# Patient Record
Sex: Female | Born: 1970 | Race: White | Hispanic: No | Smoking: Current every day smoker
Health system: Southern US, Community
[De-identification: ages and names within clinical notes are randomized; demographics above are authoritative.]

## PROBLEM LIST (undated history)

## (undated) DIAGNOSIS — F509 Eating disorder, unspecified: Secondary | ICD-10-CM

## (undated) DIAGNOSIS — N159 Renal tubulo-interstitial disease, unspecified: Secondary | ICD-10-CM

## (undated) DIAGNOSIS — F329 Major depressive disorder, single episode, unspecified: Secondary | ICD-10-CM

## (undated) DIAGNOSIS — E079 Disorder of thyroid, unspecified: Secondary | ICD-10-CM

## (undated) DIAGNOSIS — F419 Anxiety disorder, unspecified: Secondary | ICD-10-CM

## (undated) DIAGNOSIS — K3580 Unspecified acute appendicitis: Secondary | ICD-10-CM

## (undated) DIAGNOSIS — R001 Bradycardia, unspecified: Secondary | ICD-10-CM

## (undated) DIAGNOSIS — F32A Depression, unspecified: Secondary | ICD-10-CM

## (undated) DIAGNOSIS — F319 Bipolar disorder, unspecified: Secondary | ICD-10-CM

## (undated) DIAGNOSIS — K649 Unspecified hemorrhoids: Secondary | ICD-10-CM

## (undated) DIAGNOSIS — F209 Schizophrenia, unspecified: Secondary | ICD-10-CM

## (undated) DIAGNOSIS — F1010 Alcohol abuse, uncomplicated: Secondary | ICD-10-CM

## (undated) DIAGNOSIS — N200 Calculus of kidney: Secondary | ICD-10-CM

## (undated) HISTORY — PX: APPENDECTOMY: SHX54

## (undated) HISTORY — DX: Alcohol abuse, uncomplicated: F10.10

## (undated) HISTORY — DX: Bipolar disorder, unspecified: F31.9

## (undated) HISTORY — DX: Unspecified hemorrhoids: K64.9

## (undated) HISTORY — DX: Disorder of thyroid, unspecified: E07.9

## (undated) HISTORY — DX: Schizophrenia, unspecified: F20.9

## (undated) HISTORY — PX: OTHER SURGICAL HISTORY: SHX169

## (undated) HISTORY — PX: TUBAL LIGATION: SHX77

## (undated) HISTORY — DX: Calculus of kidney: N20.0

## (undated) HISTORY — DX: Eating disorder, unspecified: F50.9

---

## 2001-08-31 ENCOUNTER — Emergency Department (HOSPITAL_COMMUNITY): Admission: EM | Admit: 2001-08-31 | Discharge: 2001-08-31 | Payer: Self-pay | Admitting: Emergency Medicine

## 2002-03-11 ENCOUNTER — Encounter: Payer: Self-pay | Admitting: Internal Medicine

## 2002-03-11 ENCOUNTER — Ambulatory Visit (HOSPITAL_COMMUNITY): Admission: RE | Admit: 2002-03-11 | Discharge: 2002-03-11 | Payer: Self-pay | Admitting: Internal Medicine

## 2003-12-30 ENCOUNTER — Emergency Department (HOSPITAL_COMMUNITY): Admission: EM | Admit: 2003-12-30 | Discharge: 2003-12-30 | Payer: Self-pay | Admitting: Emergency Medicine

## 2004-08-12 ENCOUNTER — Ambulatory Visit: Payer: Self-pay | Admitting: Family Medicine

## 2004-08-19 ENCOUNTER — Ambulatory Visit (HOSPITAL_COMMUNITY): Admission: RE | Admit: 2004-08-19 | Discharge: 2004-08-19 | Payer: Self-pay | Admitting: Internal Medicine

## 2004-08-30 ENCOUNTER — Other Ambulatory Visit: Admission: RE | Admit: 2004-08-30 | Discharge: 2004-08-30 | Payer: Self-pay | Admitting: Family Medicine

## 2004-08-30 ENCOUNTER — Ambulatory Visit: Payer: Self-pay | Admitting: Family Medicine

## 2005-02-27 ENCOUNTER — Ambulatory Visit: Payer: Self-pay | Admitting: Family Medicine

## 2005-03-16 ENCOUNTER — Encounter: Admission: RE | Admit: 2005-03-16 | Discharge: 2005-03-16 | Payer: Self-pay | Admitting: Internal Medicine

## 2005-03-17 ENCOUNTER — Ambulatory Visit: Payer: Self-pay | Admitting: Family Medicine

## 2006-07-26 ENCOUNTER — Ambulatory Visit: Payer: Self-pay | Admitting: Family Medicine

## 2006-08-16 ENCOUNTER — Ambulatory Visit: Payer: Self-pay | Admitting: Internal Medicine

## 2006-08-16 ENCOUNTER — Encounter (INDEPENDENT_AMBULATORY_CARE_PROVIDER_SITE_OTHER): Payer: Self-pay | Admitting: Family Medicine

## 2006-08-16 LAB — CONVERTED CEMR LAB: Pap Smear: NORMAL

## 2006-09-06 ENCOUNTER — Encounter (INDEPENDENT_AMBULATORY_CARE_PROVIDER_SITE_OTHER): Payer: Self-pay | Admitting: Family Medicine

## 2006-09-21 ENCOUNTER — Emergency Department (HOSPITAL_COMMUNITY): Admission: EM | Admit: 2006-09-21 | Discharge: 2006-09-21 | Payer: Self-pay | Admitting: Emergency Medicine

## 2006-12-03 ENCOUNTER — Ambulatory Visit: Payer: Self-pay | Admitting: Internal Medicine

## 2007-08-05 ENCOUNTER — Ambulatory Visit: Payer: Self-pay | Admitting: Internal Medicine

## 2007-09-16 ENCOUNTER — Emergency Department (HOSPITAL_COMMUNITY): Admission: EM | Admit: 2007-09-16 | Discharge: 2007-09-16 | Payer: Self-pay | Admitting: Emergency Medicine

## 2007-09-30 ENCOUNTER — Ambulatory Visit: Payer: Self-pay | Admitting: Internal Medicine

## 2007-09-30 ENCOUNTER — Encounter: Payer: Self-pay | Admitting: Family Medicine

## 2007-09-30 LAB — CONVERTED CEMR LAB
Chlamydia, DNA Probe: NEGATIVE
GC Probe Amp, Genital: NEGATIVE

## 2007-10-08 ENCOUNTER — Ambulatory Visit: Payer: Self-pay | Admitting: Internal Medicine

## 2007-10-08 ENCOUNTER — Encounter: Payer: Self-pay | Admitting: Family Medicine

## 2007-10-08 LAB — CONVERTED CEMR LAB
ALT: 23 units/L (ref 0–35)
AST: 28 units/L (ref 0–37)
Albumin: 4.8 g/dL (ref 3.5–5.2)
Alkaline Phosphatase: 42 units/L (ref 39–117)
BUN: 9 mg/dL (ref 6–23)
Basophils Absolute: 0 10*3/uL (ref 0.0–0.1)
Basophils Relative: 1 % (ref 0–1)
CO2: 25 meq/L (ref 19–32)
Calcium: 9.8 mg/dL (ref 8.4–10.5)
Chloride: 103 meq/L (ref 96–112)
Cholesterol: 191 mg/dL (ref 0–200)
Creatinine, Ser: 0.73 mg/dL (ref 0.40–1.20)
Eosinophils Absolute: 0.1 10*3/uL (ref 0.0–0.7)
Eosinophils Relative: 2 % (ref 0–5)
Glucose, Bld: 111 mg/dL — ABNORMAL HIGH (ref 70–99)
HCT: 41.9 % (ref 36.0–46.0)
HDL: 108 mg/dL (ref >39)
Hemoglobin: 13.5 g/dL (ref 12.0–15.0)
LDL Cholesterol: 70 mg/dL (ref 0–99)
Lymphocytes Relative: 35 % (ref 12–46)
Lymphs Abs: 1.3 10*3/uL (ref 0.7–4.0)
MCHC: 32.2 g/dL (ref 30.0–36.0)
MCV: 100.7 fL — ABNORMAL HIGH (ref 78.0–100.0)
Monocytes Absolute: 0.4 10*3/uL (ref 0.1–1.0)
Monocytes Relative: 11 % (ref 3–12)
Neutro Abs: 2 10*3/uL (ref 1.7–7.7)
Neutrophils Relative %: 51 % (ref 43–77)
Platelets: 231 10*3/uL (ref 150–400)
Potassium: 4 meq/L (ref 3.5–5.3)
RBC: 4.16 M/uL (ref 3.87–5.11)
RDW: 12.5 % (ref 11.5–15.5)
Sodium: 139 meq/L (ref 135–145)
TSH: 1.543 microintl units/mL (ref 0.350–4.50)
Total Bilirubin: 0.7 mg/dL (ref 0.3–1.2)
Total CHOL/HDL Ratio: 1.8
Total Protein: 7.6 g/dL (ref 6.0–8.3)
Triglycerides: 64 mg/dL (ref <150)
VLDL: 13 mg/dL (ref 0–40)
WBC: 3.8 10*3/uL — ABNORMAL LOW (ref 4.0–10.5)

## 2007-10-31 ENCOUNTER — Emergency Department (HOSPITAL_COMMUNITY): Admission: EM | Admit: 2007-10-31 | Discharge: 2007-10-31 | Payer: Self-pay | Admitting: Emergency Medicine

## 2007-12-20 ENCOUNTER — Ambulatory Visit: Payer: Self-pay | Admitting: Internal Medicine

## 2008-02-12 ENCOUNTER — Ambulatory Visit: Payer: Self-pay | Admitting: Family Medicine

## 2008-03-24 ENCOUNTER — Ambulatory Visit: Payer: Self-pay | Admitting: Internal Medicine

## 2008-06-17 ENCOUNTER — Ambulatory Visit: Payer: Self-pay | Admitting: Family Medicine

## 2008-09-23 ENCOUNTER — Other Ambulatory Visit: Admission: RE | Admit: 2008-09-23 | Discharge: 2008-09-23 | Payer: Self-pay | Admitting: Family Medicine

## 2008-09-23 ENCOUNTER — Ambulatory Visit: Payer: Self-pay | Admitting: Internal Medicine

## 2008-09-23 ENCOUNTER — Encounter: Payer: Self-pay | Admitting: Family Medicine

## 2008-09-23 LAB — CONVERTED CEMR LAB
ALT: 18 units/L (ref 0–35)
AST: 20 units/L (ref 0–37)
Albumin: 3.8 g/dL (ref 3.5–5.2)
Alkaline Phosphatase: 49 units/L (ref 39–117)
Amphetamine Screen, Ur: NEGATIVE
BUN: 10 mg/dL (ref 6–23)
Barbiturate Quant, Ur: NEGATIVE
Basophils Absolute: 0.1 10*3/uL (ref 0.0–0.1)
Basophils Relative: 1 % (ref 0–1)
Benzodiazepines.: NEGATIVE
CO2: 28 meq/L (ref 19–32)
Calcium: 9.3 mg/dL (ref 8.4–10.5)
Chlamydia, DNA Probe: NEGATIVE
Chloride: 104 meq/L (ref 96–112)
Cocaine Metabolites: NEGATIVE
Creatinine, Ser: 0.76 mg/dL (ref 0.40–1.20)
Creatinine,U: 27.6 mg/dL
Eosinophils Absolute: 0.2 10*3/uL (ref 0.0–0.7)
Eosinophils Relative: 3 % (ref 0–5)
GC Probe Amp, Genital: NEGATIVE
Glucose, Bld: 82 mg/dL (ref 70–99)
HCT: 42.5 % (ref 36.0–46.0)
Hemoglobin: 13.8 g/dL (ref 12.0–15.0)
Lymphocytes Relative: 36 % (ref 12–46)
Lymphs Abs: 2.3 10*3/uL (ref 0.7–4.0)
MCHC: 32.5 g/dL (ref 30.0–36.0)
MCV: 95.5 fL (ref 78.0–100.0)
Marijuana Metabolite: NEGATIVE
Methadone: NEGATIVE
Monocytes Absolute: 0.4 10*3/uL (ref 0.1–1.0)
Monocytes Relative: 7 % (ref 3–12)
Neutro Abs: 3.4 10*3/uL (ref 1.7–7.7)
Neutrophils Relative %: 53 % (ref 43–77)
Opiate Screen, Urine: NEGATIVE
Phencyclidine (PCP): NEGATIVE
Platelets: 231 10*3/uL (ref 150–400)
Potassium: 4.8 meq/L (ref 3.5–5.3)
Propoxyphene: NEGATIVE
RBC: 4.45 M/uL (ref 3.87–5.11)
RDW: 14 % (ref 11.5–15.5)
Sed Rate: 2 mm/hr (ref 0–22)
Sodium: 140 meq/L (ref 135–145)
Total Bilirubin: 0.5 mg/dL (ref 0.3–1.2)
Total Protein: 5.9 g/dL — ABNORMAL LOW (ref 6.0–8.3)
WBC: 6.3 10*3/uL (ref 4.0–10.5)

## 2008-09-29 ENCOUNTER — Ambulatory Visit: Payer: Self-pay | Admitting: Internal Medicine

## 2008-12-04 ENCOUNTER — Ambulatory Visit: Payer: Self-pay | Admitting: Family Medicine

## 2008-12-04 ENCOUNTER — Encounter (INDEPENDENT_AMBULATORY_CARE_PROVIDER_SITE_OTHER): Payer: Self-pay | Admitting: Internal Medicine

## 2008-12-04 LAB — CONVERTED CEMR LAB
Basophils Absolute: 0 10*3/uL (ref 0.0–0.1)
Basophils Relative: 1 % (ref 0–1)
Eosinophils Absolute: 0.1 10*3/uL (ref 0.0–0.7)
Eosinophils Relative: 2 % (ref 0–5)
FSH: 6.7 milliintl units/mL
Ferritin: 11 ng/mL (ref 10–291)
HCT: 39.3 % (ref 36.0–46.0)
Hemoglobin: 13 g/dL (ref 12.0–15.0)
Iron: 62 ug/dL (ref 42–145)
LH: 5.1 milliintl units/mL
Lipase: 16 units/L (ref 0–75)
Lymphocytes Relative: 36 % (ref 12–46)
Lymphs Abs: 1.7 10*3/uL (ref 0.7–4.0)
MCHC: 33.1 g/dL (ref 30.0–36.0)
MCV: 95.9 fL (ref 78.0–100.0)
Monocytes Absolute: 0.3 10*3/uL (ref 0.1–1.0)
Monocytes Relative: 6 % (ref 3–12)
Neutro Abs: 2.6 10*3/uL (ref 1.7–7.7)
Neutrophils Relative %: 55 % (ref 43–77)
Platelets: 235 10*3/uL (ref 150–400)
RBC: 4.1 M/uL (ref 3.87–5.11)
RDW: 12.6 % (ref 11.5–15.5)
Saturation Ratios: 19 % — ABNORMAL LOW (ref 20–55)
TIBC: 328 ug/dL (ref 250–470)
TSH: 1.676 microintl units/mL (ref 0.350–4.500)
UIBC: 266 ug/dL
WBC: 4.8 10*3/uL (ref 4.0–10.5)

## 2008-12-09 ENCOUNTER — Ambulatory Visit (HOSPITAL_COMMUNITY): Admission: RE | Admit: 2008-12-09 | Discharge: 2008-12-09 | Payer: Self-pay | Admitting: Internal Medicine

## 2009-01-13 ENCOUNTER — Ambulatory Visit: Payer: Self-pay | Admitting: Internal Medicine

## 2009-03-23 ENCOUNTER — Ambulatory Visit: Payer: Self-pay | Admitting: Family Medicine

## 2009-06-03 ENCOUNTER — Ambulatory Visit: Payer: Self-pay | Admitting: Family Medicine

## 2009-07-26 ENCOUNTER — Ambulatory Visit: Payer: Self-pay | Admitting: Internal Medicine

## 2009-12-28 ENCOUNTER — Other Ambulatory Visit: Admission: RE | Admit: 2009-12-28 | Discharge: 2009-12-28 | Payer: Self-pay | Admitting: Internal Medicine

## 2009-12-28 ENCOUNTER — Encounter (INDEPENDENT_AMBULATORY_CARE_PROVIDER_SITE_OTHER): Payer: Self-pay | Admitting: Internal Medicine

## 2009-12-28 LAB — CONVERTED CEMR LAB
Chlamydia, DNA Probe: NEGATIVE
GC Probe Amp, Genital: NEGATIVE
HCV Ab: NEGATIVE
Hep A IgM: NEGATIVE
Hep B C IgM: NEGATIVE
Hepatitis B Surface Ag: NEGATIVE

## 2010-01-03 ENCOUNTER — Ambulatory Visit (HOSPITAL_COMMUNITY): Admission: RE | Admit: 2010-01-03 | Discharge: 2010-01-03 | Payer: Self-pay | Admitting: Internal Medicine

## 2010-03-16 ENCOUNTER — Ambulatory Visit: Payer: Self-pay | Admitting: Obstetrics and Gynecology

## 2010-05-30 LAB — POCT PREGNANCY, URINE: Preg Test, Ur: NEGATIVE

## 2010-06-02 ENCOUNTER — Ambulatory Visit: Payer: Self-pay

## 2010-12-15 LAB — URINALYSIS, ROUTINE W REFLEX MICROSCOPIC
Bilirubin Urine: NEGATIVE
Glucose, UA: NEGATIVE
Ketones, ur: NEGATIVE
Nitrite: NEGATIVE
Protein, ur: 100 — AB
Specific Gravity, Urine: 1.015
Urobilinogen, UA: 1
pH: 7.5

## 2010-12-15 LAB — PREGNANCY, URINE: Preg Test, Ur: NEGATIVE

## 2010-12-15 LAB — URINE CULTURE: Colony Count: >=100000

## 2010-12-15 LAB — URINE MICROSCOPIC-ADD ON

## 2011-02-05 ENCOUNTER — Encounter: Payer: Self-pay | Admitting: *Deleted

## 2011-02-05 ENCOUNTER — Emergency Department (HOSPITAL_COMMUNITY)
Admission: EM | Admit: 2011-02-05 | Discharge: 2011-02-05 | Disposition: A | Discharge status: Home / Self Care | Payer: Medicare Other | Attending: Emergency Medicine | Admitting: Emergency Medicine

## 2011-02-05 DIAGNOSIS — R109 Unspecified abdominal pain: Secondary | ICD-10-CM | POA: Insufficient documentation

## 2011-02-05 DIAGNOSIS — N12 Tubulo-interstitial nephritis, not specified as acute or chronic: Secondary | ICD-10-CM | POA: Insufficient documentation

## 2011-02-05 DIAGNOSIS — R300 Dysuria: Secondary | ICD-10-CM | POA: Insufficient documentation

## 2011-02-05 DIAGNOSIS — R6883 Chills (without fever): Secondary | ICD-10-CM | POA: Insufficient documentation

## 2011-02-05 HISTORY — DX: Renal tubulo-interstitial disease, unspecified: N15.9

## 2011-02-05 LAB — URINE MICROSCOPIC-ADD ON

## 2011-02-05 LAB — URINALYSIS, ROUTINE W REFLEX MICROSCOPIC
Bilirubin Urine: NEGATIVE
Glucose, UA: NEGATIVE mg/dL
Hgb urine dipstick: NEGATIVE
Ketones, ur: NEGATIVE mg/dL
Nitrite: NEGATIVE
Protein, ur: NEGATIVE mg/dL
Specific Gravity, Urine: 1.016 (ref 1.005–1.030)
Urobilinogen, UA: 0.2 mg/dL (ref 0.0–1.0)
pH: 7.5 (ref 5.0–8.0)

## 2011-02-05 LAB — POCT PREGNANCY, URINE: Preg Test, Ur: NEGATIVE

## 2011-02-05 MED ORDER — CIPROFLOXACIN HCL 500 MG PO TABS
ORAL_TABLET | ORAL | Status: AC
  Filled 2011-02-05: qty 1

## 2011-02-05 MED ORDER — CIPROFLOXACIN HCL 500 MG PO TABS: 500.0000 mg | ORAL_TABLET | Freq: Two times a day (BID) | ORAL | Status: AC

## 2011-02-05 MED ORDER — ADENOSINE 6 MG/2ML IV SOLN
INTRAVENOUS | Status: AC
  Filled 2011-02-05: qty 6

## 2011-02-05 MED ORDER — ONDANSETRON 4 MG PO TBDP
8.0000 mg | ORAL_TABLET | Freq: Once | ORAL | Status: AC
  Administered 2011-02-05: 8 mg via ORAL
  Filled 2011-02-05: qty 2

## 2011-02-05 MED ORDER — CIPROFLOXACIN HCL 500 MG PO TABS
500.0000 mg | ORAL_TABLET | Freq: Once | ORAL | Status: AC
  Administered 2011-02-05: 500 mg via ORAL

## 2011-02-05 MED ORDER — OXYCODONE-ACETAMINOPHEN 5-325 MG PO TABS
2.0000 | ORAL_TABLET | Freq: Once | ORAL | Status: AC
  Administered 2011-02-05: 2 via ORAL
  Filled 2011-02-05: qty 2

## 2011-02-05 NOTE — ED Notes (Signed)
Reports fever, pain with urination, right flank pain.

## 2011-02-05 NOTE — ED Provider Notes (Signed)
History     CSN: [id number] Arrival date & time: 02/05/2011 10:28 AM   First MD Initiated Contact with Patient 02/05/11 1041    pt seen at 1135am  Chief Complaint  Patient presents with  . Flank Pain    Patient is a 40 y.o. female presenting with flank pain. The history is provided by the patient.  Flank Pain This is a new problem. The current episode started yesterday. The problem occurs constantly. The problem has been gradually worsening. Pertinent negatives include no chest pain, no abdominal pain, no headaches and no shortness of breath. Exacerbated by: urination. The symptoms are relieved by nothing. Treatments tried: pain meds. The treatment provided mild relief.  pt with right flank pain, dysuria and chills for past day Also reports nausea Denies vag bleeding/discharge She does report h/o frequent UTI in past  Past Medical History  Diagnosis Date  . Kidney infection     Past Surgical History  Procedure Date  . Tubal ligation     History reviewed. No pertinent family history.  History  Substance Use Topics  . Smoking status: Never Smoker   . Smokeless tobacco: Not on file  . Alcohol Use: No    OB History    Grav Para Term Preterm Abortions TAB SAB Ect Mult Living                  Review of Systems  Respiratory: Negative for shortness of breath.   Cardiovascular: Negative for chest pain.  Gastrointestinal: Negative for abdominal pain.  Genitourinary: Positive for flank pain.  Neurological: Negative for headaches.  All other systems reviewed and are negative.    Allergies  Penicillins  Home Medications   Current Outpatient Rx  Name Route Sig Dispense Refill  . MENSTRUAL COMPLETE MAX STR PO Oral Take 2 capsules by mouth every 6 (six) hours.        BP 113/69  Pulse 62  Temp(Src) 98 F (36.7 C) (Oral)  Resp 18  SpO2 100%  Physical Exam  CONSTITUTIONAL: Well developed/well nourished HEAD AND FACE: Normocephalic/atraumatic EYES:  EOMI/PERRL ENMT: Mucous membranes moist NECK: supple no meningeal signs SPINE:entire spine nontender CV: S1/S2 noted, no murmurs/rubs/gallops noted LUNGS: Lungs are clear to auscultation bilaterally, no apparent distress ABDOMEN: soft, nontender, no rebound or guarding GE:XBMWU cva tenderness NEURO: Pt is awake/alert, moves all extremitiesx4 EXTREMITIES: pulses normal, full ROM SKIN: warm, color normal PSYCH: no abnormalities of mood noted   ED Course  Procedures    Labs Reviewed  POCT PREGNANCY, URINE  URINALYSIS, ROUTINE W REFLEX MICROSCOPIC      11:59 AM Will tx pain and reassess, stable at this time   2:01 PM Pt improved BP 110/77  Pulse 71  Temp(Src) 98.4 F (36.9 C) (Oral)  Resp 16  SpO2 100% Taking PO abd soft No CP/SOB reported Denies h/o urologic surgery Start cipro due to pcn allergy Discussed strict return precautions  MDM  Nursing notes reviewed and considered in documentation All labs/vitals reviewed and considered         Joya Gaskins, MD 02/05/11 1401

## 2011-07-07 ENCOUNTER — Encounter (HOSPITAL_COMMUNITY): Payer: Self-pay | Admitting: Emergency Medicine

## 2011-07-07 ENCOUNTER — Emergency Department (HOSPITAL_COMMUNITY)
Admission: EM | Admit: 2011-07-07 | Discharge: 2011-07-08 | Disposition: A | Discharge status: Home / Self Care | Payer: Medicare Other | Attending: Emergency Medicine | Admitting: Emergency Medicine

## 2011-07-07 ENCOUNTER — Emergency Department (HOSPITAL_COMMUNITY): Payer: Medicare Other

## 2011-07-07 DIAGNOSIS — D259 Leiomyoma of uterus, unspecified: Secondary | ICD-10-CM | POA: Insufficient documentation

## 2011-07-07 DIAGNOSIS — R300 Dysuria: Secondary | ICD-10-CM | POA: Insufficient documentation

## 2011-07-07 DIAGNOSIS — R509 Fever, unspecified: Secondary | ICD-10-CM | POA: Insufficient documentation

## 2011-07-07 DIAGNOSIS — R10813 Right lower quadrant abdominal tenderness: Secondary | ICD-10-CM | POA: Insufficient documentation

## 2011-07-07 DIAGNOSIS — N831 Corpus luteum cyst of ovary, unspecified side: Secondary | ICD-10-CM | POA: Insufficient documentation

## 2011-07-07 LAB — DIFFERENTIAL
Basophils Absolute: 0 10*3/uL (ref 0.0–0.1)
Basophils Relative: 0 % (ref 0–1)
Eosinophils Absolute: 0 10*3/uL (ref 0.0–0.7)
Eosinophils Relative: 0 % (ref 0–5)
Lymphocytes Relative: 8 % — ABNORMAL LOW (ref 12–46)
Lymphs Abs: 0.8 10*3/uL (ref 0.7–4.0)
Monocytes Absolute: 0.2 10*3/uL (ref 0.1–1.0)
Monocytes Relative: 2 % — ABNORMAL LOW (ref 3–12)
Neutro Abs: 8.7 10*3/uL — ABNORMAL HIGH (ref 1.7–7.7)
Neutrophils Relative %: 89 % — ABNORMAL HIGH (ref 43–77)

## 2011-07-07 LAB — CBC
HCT: 34 % — ABNORMAL LOW (ref 36.0–46.0)
Hemoglobin: 11.6 g/dL — ABNORMAL LOW (ref 12.0–15.0)
MCH: 30.9 pg (ref 26.0–34.0)
MCHC: 34.1 g/dL (ref 30.0–36.0)
MCV: 90.7 fL (ref 78.0–100.0)
Platelets: 240 10*3/uL (ref 150–400)
RBC: 3.75 MIL/uL — ABNORMAL LOW (ref 3.87–5.11)
RDW: 13.4 % (ref 11.5–15.5)
WBC: 9.7 10*3/uL (ref 4.0–10.5)

## 2011-07-07 LAB — URINALYSIS, ROUTINE W REFLEX MICROSCOPIC
Bilirubin Urine: NEGATIVE
Glucose, UA: NEGATIVE mg/dL
Hgb urine dipstick: NEGATIVE
Ketones, ur: NEGATIVE mg/dL
Leukocytes, UA: NEGATIVE
Nitrite: NEGATIVE
Protein, ur: NEGATIVE mg/dL
Specific Gravity, Urine: 1.013 (ref 1.005–1.030)
Urobilinogen, UA: 0.2 mg/dL (ref 0.0–1.0)
pH: 8 (ref 5.0–8.0)

## 2011-07-07 LAB — WET PREP, GENITAL
Clue Cells Wet Prep HPF POC: NONE SEEN
Trich, Wet Prep: NONE SEEN
Yeast Wet Prep HPF POC: NONE SEEN

## 2011-07-07 LAB — BASIC METABOLIC PANEL
BUN: 12 mg/dL (ref 6–23)
CO2: 22 mEq/L (ref 19–32)
Calcium: 8.2 mg/dL — ABNORMAL LOW (ref 8.4–10.5)
Chloride: 104 mEq/L (ref 96–112)
Creatinine, Ser: 0.79 mg/dL (ref 0.50–1.10)
GFR calc Af Amer: >90 mL/min (ref >90)
GFR calc non Af Amer: >90 mL/min (ref >90)
Glucose, Bld: 102 mg/dL — ABNORMAL HIGH (ref 70–99)
Potassium: 3.7 mEq/L (ref 3.5–5.1)
Sodium: 135 mEq/L (ref 135–145)

## 2011-07-07 LAB — POCT PREGNANCY, URINE: Preg Test, Ur: NEGATIVE

## 2011-07-07 MED ORDER — ACETAMINOPHEN 325 MG PO TABS
ORAL_TABLET | ORAL | Status: AC
  Administered 2011-07-07: 975 mg via ORAL
  Filled 2011-07-07: qty 3

## 2011-07-07 MED ORDER — IOHEXOL 300 MG/ML  SOLN
80.0000 mL | Freq: Once | INTRAMUSCULAR | Status: AC | PRN
  Administered 2011-07-07: 80 mL via INTRAVENOUS

## 2011-07-07 MED ORDER — ACETAMINOPHEN 500 MG PO TABS
1000.0000 mg | ORAL_TABLET | Freq: Once | ORAL | Status: AC
  Administered 2011-07-07: 975 mg via ORAL
  Filled 2011-07-07: qty 2

## 2011-07-07 MED ORDER — IOHEXOL 300 MG/ML  SOLN
20.0000 mL | INTRAMUSCULAR | Status: AC
  Administered 2011-07-07: 20 mL via ORAL

## 2011-07-07 NOTE — ED Notes (Signed)
PT drinking contrast. Repositioned. Resting comfortably

## 2011-07-07 NOTE — ED Notes (Signed)
Patient ambulated to restroom.

## 2011-07-07 NOTE — ED Provider Notes (Signed)
History     CSN: [id number]  Arrival date & time 07/07/11  4098   First MD Initiated Contact with Patient 07/07/11 1910      Chief Complaint  Patient presents with  . Fever    (Consider location/radiation/quality/duration/timing/severity/associated sxs/prior treatment) HPI  41 year old female presents with a chief complaint of fever. Patient states since last night she has been experiencing both fever, chills, and a "balloon sensation" to the right flank. She denies any significant abdominal pain but noticed mild burning urination without hematuria or vaginal discharge. Symptom has been persistent, onset was gradual. She took some ibuprofen with some relief. She denies headache, chest pain, shortness of breath, cough, nausea, vomiting, diarrhea, rash, weakness or numbness. She states her last menstrual period was about a month ago. Denies any significant medical history, but states she has history of kidney reflux.  Past Medical History  Diagnosis Date  . Kidney infection     Past Surgical History  Procedure Date  . Tubal ligation     No family history on file.  History  Substance Use Topics  . Smoking status: Never Smoker   . Smokeless tobacco: Not on file  . Alcohol Use: No    OB History    Grav Para Term Preterm Abortions TAB SAB Ect Mult Living                  Review of Systems  All other systems reviewed and are negative.    Allergies  Penicillins  Home Medications   Current Outpatient Rx  Name Route Sig Dispense Refill  . IBUPROFEN 200 MG PO TABS Oral Take 600 mg by mouth every 6 (six) hours as needed. For pain      BP 101/63  Pulse 81  Temp(Src) 102 F (38.9 C) (Oral)  Resp 16  SpO2 100%  LMP 06/07/2011  Physical Exam  Nursing note and vitals reviewed. Constitutional: She is oriented to person, place, and time. She appears well-developed and well-nourished. No distress.       Awake, alert, nontoxic appearance  HENT:  Head: Atraumatic.    Eyes: Conjunctivae are normal. Right eye exhibits no discharge. Left eye exhibits no discharge.  Neck: Neck supple.  Cardiovascular: Normal rate and regular rhythm.   Pulmonary/Chest: Effort normal. No respiratory distress. She exhibits no tenderness.  Abdominal: Soft. There is tenderness in the right lower quadrant. There is no rigidity, no rebound, no guarding, no tenderness at McBurney's point and negative Murphy's sign. No hernia. Hernia confirmed negative in the ventral area.  Genitourinary:       Defer to Dr. Charlsie Merles note as pt request female to perform pelvic exam  Musculoskeletal: She exhibits no tenderness.       ROM appears intact, no obvious focal weakness  Neurological: She is alert and oriented to person, place, and time.       Mental status and motor strength appears intact  Skin: No rash noted.  Psychiatric: She has a normal mood and affect.    ED Course  Procedures (including critical care time)   Labs Reviewed  URINALYSIS, ROUTINE W REFLEX MICROSCOPIC  POCT PREGNANCY, URINE   No results found.   No diagnosis found.  Results for orders placed during the hospital encounter of 07/07/11  URINALYSIS, ROUTINE W REFLEX MICROSCOPIC      Component Value Range   Color, Urine YELLOW  YELLOW    APPearance CLEAR  CLEAR    Specific Gravity, Urine 1.013  1.005 - 1.030  pH 8.0  5.0 - 8.0    Glucose, UA NEGATIVE  NEGATIVE (mg/dL)   Hgb urine dipstick NEGATIVE  NEGATIVE    Bilirubin Urine NEGATIVE  NEGATIVE    Ketones, ur NEGATIVE  NEGATIVE (mg/dL)   Protein, ur NEGATIVE  NEGATIVE (mg/dL)   Urobilinogen, UA 0.2  0.0 - 1.0 (mg/dL)   Nitrite NEGATIVE  NEGATIVE    Leukocytes, UA NEGATIVE  NEGATIVE   POCT PREGNANCY, URINE      Component Value Range   Preg Test, Ur NEGATIVE  NEGATIVE   CBC      Component Value Range   WBC 9.7  4.0 - 10.5 (K/uL)   RBC 3.75 (*) 3.87 - 5.11 (MIL/uL)   Hemoglobin 11.6 (*) 12.0 - 15.0 (g/dL)   HCT 40.9 (*) 81.1 - 46.0 (%)   MCV 90.7   78.0 - 100.0 (fL)   MCH 30.9  26.0 - 34.0 (pg)   MCHC 34.1  30.0 - 36.0 (g/dL)   RDW 91.4  78.2 - 95.6 (%)   Platelets 240  150 - 400 (K/uL)  DIFFERENTIAL      Component Value Range   Neutrophils Relative 89 (*) 43 - 77 (%)   Neutro Abs 8.7 (*) 1.7 - 7.7 (K/uL)   Lymphocytes Relative 8 (*) 12 - 46 (%)   Lymphs Abs 0.8  0.7 - 4.0 (K/uL)   Monocytes Relative 2 (*) 3 - 12 (%)   Monocytes Absolute 0.2  0.1 - 1.0 (K/uL)   Eosinophils Relative 0  0 - 5 (%)   Eosinophils Absolute 0.0  0.0 - 0.7 (K/uL)   Basophils Relative 0  0 - 1 (%)   Basophils Absolute 0.0  0.0 - 0.1 (K/uL)  BASIC METABOLIC PANEL      Component Value Range   Sodium 135  135 - 145 (mEq/L)   Potassium 3.7  3.5 - 5.1 (mEq/L)   Chloride 104  96 - 112 (mEq/L)   CO2 22  19 - 32 (mEq/L)   Glucose, Bld 102 (*) 70 - 99 (mg/dL)   BUN 12  6 - 23 (mg/dL)   Creatinine, Ser 2.13  0.50 - 1.10 (mg/dL)   Calcium 8.2 (*) 8.4 - 10.5 (mg/dL)   GFR calc non Af Amer >90  >90 (mL/min)   GFR calc Af Amer >90  >90 (mL/min)  WET PREP, GENITAL      Component Value Range   Yeast Wet Prep HPF POC NONE SEEN  NONE SEEN    Trich, Wet Prep NONE SEEN  NONE SEEN    Clue Cells Wet Prep HPF POC NONE SEEN  NONE SEEN    WBC, Wet Prep HPF POC FEW (*) NONE SEEN    Dg Chest 2 View  07/07/2011  *RADIOLOGY REPORT*  Clinical Data: Fever.  Bloating.  History of smoking.  CHEST - 2 VIEW  Comparison: 10/31/2007  Findings: Cardiomediastinal silhouette is within normal limits. The lungs are free of focal consolidations and pleural effusions. Visualized osseous structures have a normal appearance.  IMPRESSION: Negative exam.  Original Report Authenticated By: Patterson Hammersmith, M.D.   Ct Abdomen Pelvis W Contrast  07/07/2011  *RADIOLOGY REPORT*  Clinical Data: Pain, fever, history of kidney infections  CT ABDOMEN AND PELVIS WITH CONTRAST  Technique:  Multidetector CT imaging of the abdomen and pelvis was performed following the standard protocol during bolus  administration of intravenous contrast.  Contrast: 80mL OMNIPAQUE IOHEXOL 300 MG/ML  SOLN  Comparison: Pelvic ultrasound dated 01/03/2010  Findings: Minimal dependent atelectasis the lung base.  Liver is notable for a tiny hypodense lesion in the lateral segment right lobe (series 2/image 11).  Spleen, pancreas, adrenal glands are within normal limits.  Lobulated 7.6 x 7.5 x 12.4 cm fluid density lesion in the left upper abdomen, distinct from the spleen, pancreatic tail, and left kidney, although with mild mass effect displacing neighboring structures.  While incompletely characterized, the appearance suggests a lymphangioma.  Gallbladder is unremarkable.  No intrahepatic or extrahepatic ductal dilatation.  Scattered areas of bilateral renal cortical scarring.  No hydronephrosis.  No evidence of bowel obstruction. Appendix is notable for high density material within the lumen but without associated inflammatory changes.  No evidence of abdominal aortic aneurysm.  Small volume pelvic ascites, simple, likely physiologic.  Uterine fundus is heterogeneous, likely reflecting adenomyomatosis or an ill-defined fibroid when correlating with ultrasound.  The left ovary is within normal limits.  Right ovary is notable for a 2.4 cm corpus luteal cyst.  Bladder is within normal limits.  Visualized osseous structures are within normal limits.  IMPRESSION: No evidence of acute appendicitis.  2.4 cm right corpus luteal cyst.  Heterogeneous uterine fundus, likely reflecting adenomyomatosis or an ill-defined fibroid when correlating with prior ultrasound.  Lobulated 7.6 x 7.5 x 12.4 cm fluid density lesion in the left upper abdomen, incompletely characterized, most suggestive of a lymphangioma.  Original Report Authenticated By: Charline Bills, M.D.      MDM  Patient with fever 102 and abdomen is mildly tender on exam, nonsurgical. She appears to be in no acute distress. UA is negative for urinary tract infection. Negative  pregnancy test. Plan to obtain pelvic examination, chest x-ray, and basic labs for further evaluation.  8:11 PM Pt request for a female provider for pelvic exam.  Dr. Golda Acre has performed pelvic exam and did not note any acute abnormality.  Plan for abd/pelvic CT scan to r/o appendicitis due to location of pain.     11:47 PM Patient has normal electrolytes. Her prep is unremarkable. CBC shows normal white blood count. Urinalysis is negative. Pregnancy test is negative. Chest x-ray is unremarkable. Abdominal and pelvis CT scans shows no evidence of acute appendicitis. There is a 2.4 cm right corpus luteal cyst, which may account for the sensation of "balloon" in her right abdomen. 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    Result were discussed with patient.  Pt sts she felt better after tylenol.  She agrees to f/u with her PCP for further evaluation.  Strict f/u instruction given.  Pt is thin with low blood pressure but asymptomatic.     Fayrene Helper, PA-C 07/08/11 0010  Fayrene Helper, PA-C 07/08/11 1610

## 2011-07-07 NOTE — ED Provider Notes (Signed)
History     CSN: [id number]  Arrival date & time 07/07/11  4098   First MD Initiated Contact with Patient 07/07/11 1910      Chief Complaint  Patient presents with  . Fever    (Consider location/radiation/quality/duration/timing/severity/associated sxs/prior treatment) HPI  Past Medical History  Diagnosis Date  . Kidney infection     Past Surgical History  Procedure Date  . Tubal ligation     No family history on file.  History  Substance Use Topics  . Smoking status: Never Smoker   . Smokeless tobacco: Not on file  . Alcohol Use: No    OB History    Grav Para Term Preterm Abortions TAB SAB Ect Mult Living                  Review of Systems  Allergies  Penicillins  Home Medications   Current Outpatient Rx  Name Route Sig Dispense Refill  . IBUPROFEN 200 MG PO TABS Oral Take 600 mg by mouth every 6 (six) hours as needed. For pain      BP 101/63  Pulse 81  Temp(Src) 102 F (38.9 C) (Oral)  Resp 16  SpO2 100%  LMP 06/07/2011  Physical Exam  Genitourinary:       Examination chaperoned by Lequita Halt, RN.  Pt with normal external genitalia.  Small amount of white discharge in vault.  No CMT.  No adnexal masses.  No bleeding.  Closed os.      ED Course  Procedures (including critical care time)   Labs Reviewed  URINALYSIS, ROUTINE W REFLEX MICROSCOPIC  POCT PREGNANCY, URINE  CBC  DIFFERENTIAL  BASIC METABOLIC PANEL  WET PREP, GENITAL  GC/CHLAMYDIA PROBE AMP, GENITAL   No results found.   No diagnosis found.    MDM  Medical screening examination/treatment/procedure(s) were conducted as a shared visit with non-physician practitioner(s) and myself.  I personally evaluated the patient during the encounter   Patient with complaints of right lower quadrant abdominal pain since yesterday.  No nausea, vomiting or diarrhea.  No dysuria.  No vaginal discharge.  Patient denies any sexual activity.  Patient's UA does not demonstrate any urinary  tract infection and her pelvic exam shows no acute abnormalities.  A CT scan will be ordered to further evaluate for possible appendicitis given patient's right-sided pain.  She does not have significant rebound or guarding on her exam but due to the location of her pain and fever that is still a consideration.        Nat Christen, MD 07/07/11 2011

## 2011-07-07 NOTE — ED Notes (Signed)
C/o fever and R side pain since this morning.  Reports chills last night.  States kidneys were hurting earlier today but denies at present.   "Tenderness" with urination.

## 2011-07-07 NOTE — ED Notes (Signed)
Pt here for abd pain sts kidney are hurting and her ovaries are hurting. Denies intercourse.

## 2011-07-08 LAB — GC/CHLAMYDIA PROBE AMP, GENITAL
Chlamydia, DNA Probe: NEGATIVE
GC Probe Amp, Genital: NEGATIVE

## 2011-07-08 MED ORDER — ACETAMINOPHEN 500 MG PO TABS: 500.0000 mg | ORAL_TABLET | Freq: Four times a day (QID) | ORAL | Status: AC | PRN

## 2011-07-08 NOTE — Discharge Instructions (Signed)
Fever  Fever is a higher-than-normal body temperature. A normal temperature varies with:  Age.   How it is measured (mouth, underarm, rectal, or ear).   Time of day.  In an adult, an oral temperature around 98.6 Fahrenheit (F) or 37 Celsius (C) is considered normal. A rise in temperature of about 1.8 F or 1 C is generally considered a fever (100.4 F or 38 C). In an infant age 39 days or less, a rectal temperature of 100.4 F (38 C) generally is regarded as fever. Fever is not a disease but can be a symptom of illness. CAUSES   Fever is most commonly caused by infection.   Some non-infectious problems can cause fever. For example:   Some arthritis problems.   Problems with the thyroid or adrenal glands.   Immune system problems.   Some kinds of cancer.   A reaction to certain medicines.   Occasionally, the source of a fever cannot be determined. This is sometimes called a "Fever of Unknown Origin" (FUO).   Some situations may lead to a temporary rise in body temperature that may go away on its own. Examples are:   Childbirth.   Surgery.   Some situations may cause a rise in body temperature but these are not considered "true fever". Examples are:   Intense exercise.   Dehydration.   Exposure to high outside or room temperatures.  SYMPTOMS   Feeling warm or hot.   Fatigue or feeling exhausted.   Aching all over.   Chills.   Shivering.   Sweats.  DIAGNOSIS  A fever can be suspected by your caregiver feeling that your skin is unusually warm. The fever is confirmed by taking a temperature with a thermometer. Temperatures can be taken different ways. Some methods are accurate and some are not: With adults, adolescents, and children:   An oral temperature is used most commonly.   An ear thermometer will only be accurate if it is positioned as recommended by the manufacturer.   Under the arm temperatures are not accurate and not recommended.   Most  electronic thermometers are fast and accurate.  Infants and Toddlers:  Rectal temperatures are recommended and most accurate.   Ear temperatures are not accurate in this age group and are not recommended.   Skin thermometers are not accurate.  RISKS AND COMPLICATIONS   During a fever, the body uses more oxygen, so a person with a fever may develop rapid breathing or shortness of breath. This can be dangerous especially in people with heart or lung disease.   The sweats that occur following a fever can cause dehydration.   High fever can cause seizures in infants and children.   Older persons can develop confusion during a fever.  TREATMENT   Medications may be used to control temperature.   Do not give aspirin to children with fevers. There is an association with Reye's syndrome. Reye's syndrome is a rare but potentially deadly disease.   If an infection is present and medications have been prescribed, take them as directed. Finish the full course of medications until they are gone.   Sponging or bathing with room-temperature water may help reduce body temperature. Do not use ice water or alcohol sponge baths.   Do not over-bundle children in blankets or heavy clothes.   Drinking adequate fluids during an illness with fever is important to prevent dehydration.  HOME CARE INSTRUCTIONS   For adults, rest and adequate fluid intake are important. Dress according   to how you feel, but do not over-bundle.   Drink enough water and/or fluids to keep your urine clear or pale yellow.   For infants over 3 months and children, giving medication as directed by your caregiver to control fever can help with comfort. The amount to be given is based on the child's weight. Do NOT give more than is recommended.  SEEK MEDICAL CARE IF:   You or your child are unable to keep fluids down.   Vomiting or diarrhea develops.   You develop a skin rash.   An oral temperature above 102 F (38.9 C)  develops, or a fever which persists for over 3 days.   You develop excessive weakness, dizziness, fainting or extreme thirst.   Fevers keep coming back after 3 days.  SEEK IMMEDIATE MEDICAL CARE IF:   Shortness of breath or trouble breathing develops   You pass out.   You feel you are making little or no urine.   New pain develops that was not there before (such as in the head, neck, chest, back, or abdomen).   You cannot hold down fluids.   Vomiting and diarrhea persist for more than a day or two.   You develop a stiff neck and/or your eyes become sensitive to light.   An unexplained temperature above 102 F (38.9 C) develops.  Document Released: 03/06/2005 Document Revised: 02/23/2011 Document Reviewed: 02/20/2008 [employer] Patient Information 2012 [employer]. 

## 2011-09-12 ENCOUNTER — Other Ambulatory Visit (HOSPITAL_COMMUNITY): Payer: Self-pay | Admitting: Internal Medicine

## 2011-09-12 DIAGNOSIS — R109 Unspecified abdominal pain: Secondary | ICD-10-CM

## 2011-09-13 ENCOUNTER — Ambulatory Visit (INDEPENDENT_AMBULATORY_CARE_PROVIDER_SITE_OTHER): Payer: Medicare Other | Admitting: Family Medicine

## 2011-09-13 ENCOUNTER — Encounter: Payer: Self-pay | Admitting: Family Medicine

## 2011-09-13 VITALS — Ht 67.75 in | Wt 113.8 lb

## 2011-09-13 DIAGNOSIS — F5000 Anorexia nervosa, unspecified: Secondary | ICD-10-CM

## 2011-09-13 NOTE — Progress Notes (Signed)
Medical Nutrition Therapy:  Appt start time: 1030 end time:  1100.  Assessment:  Primary concerns today: eating disorder.  Rainee would like a diet plan and eating schedule that will help her "not have an issue with food anymore."  Etherine would like to have "normal" eating habits.  She has a long hx of disordered eating, which includes restriction, bingeing, and purging.  She first had problems, restricting, as an adolescent, "shortly after having sex for the first time."  Ivianna first received help for her eating D/O this summer, although she sought help more than a year ago, and could not find anyone who takes Medicaid.  She is now seeing Ned Clines for medication mgmt through [facility], where she also sees a counselor named [employer], who is teaching Rise to identify and deal with thought patterns.     Usual eating pattern includes 1-2 meals and 1-2 snacks per day. Usual physical activity includes walking and biking and trampolining, as well as strength training most days of the week; she is wary of over-exercising b/c of a hx of excessive exercise. Elaiza is not working currently, and wants to feel in better control of her eating disorder before looking for work.  "I don't know who I am yet, out of starvation mode." Recently Keonta has been having upper right quadrant pain.  Her [facility] dr said it may be gall bladder or liver-related.  She will get [place] tomorrow as part of the workup.   Everyday foods include depend on what she is currently craving, i.e., salty pretzels or crackers, 2 c low-Na V8, 1-2 c almond milk, 4 c black half-caf coffee.  Avoided foods include fried foods, high-fat and trans fat sources, sugary foods.     24-hr recall: B ( AM)-   4 c half-caf coffee Snk ( AM)-   none L (2 PM)-  Large salad, 1 c mac & (low-fat) cheese w/ 1 c cooked cabbage  Snk ( PM)-  none D (7 PM)-  Low-fat grilled cheese sandwich, almond milk shake w/ sugar-free choc syrup & 26 g whey pro,  saltines, low-fat cheddar, 1 c low-Na tomato juice  Snk ( PM)-  none  Progress Towards Goal(s):  In progress.   Nutritional Diagnosis:  NI-1.4 Inadequate energy intake As related to food and weight anxiety.  As evidenced by usual eating pattern of 1-2 eating episodes/day.    Intervention:  Nutrition education.  Monitoring/Evaluation:  Dietary intake, exercise, and body weight in 1 month.

## 2011-09-13 NOTE — Patient Instructions (Addendum)
-   Follow food plan provided today:  8 starches, 8 proteins, .  - Healthy fats:  Avocado, nuts & seeds, fish, olive oil, canola oil, natural nut butters - Eat at least 3 meals per day.  Aim for no more than 5 hours between eating, so if a meal is to be delayed, plan a snack. - Record food intake daily, and categorize into exchanges on forms provided.    - Ask [facility] to fax referral for Medical Nutrition Therapy:  [phone], attn Dr. Gerilyn Pilgrim.

## 2011-09-14 ENCOUNTER — Ambulatory Visit (HOSPITAL_COMMUNITY)
Admission: RE | Admit: 2011-09-14 | Discharge: 2011-09-14 | Disposition: A | Discharge status: Home / Self Care | Payer: Medicare Other | Source: Ambulatory Visit | Attending: Internal Medicine | Admitting: Internal Medicine

## 2011-09-14 DIAGNOSIS — R109 Unspecified abdominal pain: Secondary | ICD-10-CM

## 2011-09-18 ENCOUNTER — Ambulatory Visit: Payer: Medicare Other | Admitting: Family Medicine

## 2011-10-02 NOTE — Progress Notes (Signed)
Patient ID: Cassandra Cunningham, female   DOB: [birth year], 41 y.o.   MRN: [id number] Food plan provided 09/13/11: 8 starch, 8 protein, 4 milk ,4 fruit, 4 veg, 5 fats.

## 2011-10-09 ENCOUNTER — Ambulatory Visit: Payer: Medicare Other | Admitting: Family Medicine

## 2012-03-26 ENCOUNTER — Emergency Department (HOSPITAL_COMMUNITY)
Admission: EM | Admit: 2012-03-26 | Discharge: 2012-03-26 | Disposition: A | Discharge status: Home / Self Care | Payer: Medicare Other | Attending: Emergency Medicine | Admitting: Emergency Medicine

## 2012-03-26 ENCOUNTER — Encounter (HOSPITAL_COMMUNITY): Payer: Self-pay | Admitting: *Deleted

## 2012-03-26 DIAGNOSIS — Z79899 Other long term (current) drug therapy: Secondary | ICD-10-CM | POA: Insufficient documentation

## 2012-03-26 DIAGNOSIS — R197 Diarrhea, unspecified: Secondary | ICD-10-CM | POA: Insufficient documentation

## 2012-03-26 DIAGNOSIS — Z87448 Personal history of other diseases of urinary system: Secondary | ICD-10-CM | POA: Insufficient documentation

## 2012-03-26 DIAGNOSIS — K648 Other hemorrhoids: Secondary | ICD-10-CM | POA: Insufficient documentation

## 2012-03-26 DIAGNOSIS — K625 Hemorrhage of anus and rectum: Secondary | ICD-10-CM | POA: Insufficient documentation

## 2012-03-26 DIAGNOSIS — Z87891 Personal history of nicotine dependence: Secondary | ICD-10-CM | POA: Insufficient documentation

## 2012-03-26 LAB — CBC WITH DIFFERENTIAL/PLATELET
Basophils Absolute: 0.1 10*3/uL (ref 0.0–0.1)
Basophils Relative: 1 % (ref 0–1)
Eosinophils Absolute: 0 10*3/uL (ref 0.0–0.7)
Eosinophils Relative: 1 % (ref 0–5)
HCT: 35 % — ABNORMAL LOW (ref 36.0–46.0)
Hemoglobin: 11.5 g/dL — ABNORMAL LOW (ref 12.0–15.0)
Lymphocytes Relative: 24 % (ref 12–46)
Lymphs Abs: 0.9 10*3/uL (ref 0.7–4.0)
MCH: 29.7 pg (ref 26.0–34.0)
MCHC: 32.9 g/dL (ref 30.0–36.0)
MCV: 90.4 fL (ref 78.0–100.0)
Monocytes Absolute: 0.3 10*3/uL (ref 0.1–1.0)
Monocytes Relative: 9 % (ref 3–12)
Neutro Abs: 2.4 10*3/uL (ref 1.7–7.7)
Neutrophils Relative %: 65 % (ref 43–77)
Platelets: 224 10*3/uL (ref 150–400)
RBC: 3.87 MIL/uL (ref 3.87–5.11)
RDW: 13.1 % (ref 11.5–15.5)
WBC: 3.7 10*3/uL — ABNORMAL LOW (ref 4.0–10.5)

## 2012-03-26 MED ORDER — PRAMOXINE HCL 1 % RE FOAM: RECTAL | Status: DC | PRN

## 2012-03-26 MED ORDER — PHENYLEPHRINE-WITCH HAZEL 0.25-50 % RE GEL: RECTAL | Status: DC

## 2012-03-26 MED ORDER — POLYETHYLENE GLYCOL 3350 17 G PO PACK: 17.0000 g | PACK | Freq: Every day | ORAL | Status: DC

## 2012-03-26 MED ORDER — HYDROCORTISONE 2.5 % RE CREA
TOPICAL_CREAM | Freq: Three times a day (TID) | RECTAL | Status: DC
  Filled 2012-03-26: qty 28.35

## 2012-03-26 NOTE — ED Provider Notes (Signed)
History     CSN: [id number]  Arrival date & time 03/26/12  4098   First MD Initiated Contact with Patient 03/26/12 [phone]      Chief Complaint  Patient presents with  . Rectal Bleeding    (Consider location/radiation/quality/duration/timing/severity/associated sxs/prior treatment) HPI 42 year old female presents to emergency department complaining of bright red blood per time this morning. Patient got up this morning and felt she was going to have any episode of diarrhea. She reports watery stool but mainly blood. She had blood when wiping afterwards. She has had some blood on her panty liner. Patient has history of hemorrhoids, reports internal hemorrhoids seen on colonoscopy in the past 2 years ago. Patient reports she has intermittent constipation and flares of her hemorrhoids. She reports it is currently uncomfortable to sit. She has swelling at her rectum. She denies any abdominal pain, no fever no chills no nausea no vomiting. She denies any dizziness weakness or lightheadedness. Past Medical History  Diagnosis Date  . Kidney infection     Past Surgical History  Procedure Date  . Tubal ligation     History reviewed. No pertinent family history.  History  Substance Use Topics  . Smoking status: Former Games developer  . Smokeless tobacco: Not on file  . 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      OB History    Grav Para Term Preterm Abortions TAB SAB Ect Mult Living                  Review of Systems  See History of Present Illness; otherwise all other systems are reviewed and negative  Allergies  Penicillins  Home Medications   Current Outpatient Rx  Name  Route  Sig  Dispense  Refill  . MAGNESIUM ASPARTATE PO   Oral   Take by mouth.         . DAILY WOMENS HEALTH FORMULA PO TABS   Oral   Take by mouth every morning.           BP 118/85  Pulse 54  Temp 97.5 F (36.4 C) (Oral)  Resp 16  SpO2 100%  Physical  Exam  Nursing note and vitals reviewed. Constitutional: She is oriented to person, place, and time. She appears well-developed and well-nourished. No distress.  HENT:  Head: Normocephalic and atraumatic.  Nose: Nose normal.  Mouth/Throat: Oropharynx is clear and moist.  Neck: Normal range of motion. Neck supple. No JVD present. No tracheal deviation present. No thyromegaly present.  Cardiovascular: Normal rate, regular rhythm, normal heart sounds and intact distal pulses.  Exam reveals no gallop and no friction rub.   No murmur heard. Pulmonary/Chest: Effort normal and breath sounds normal. No stridor. No respiratory distress. She has no wheezes. She has no rales. She exhibits no tenderness.  Abdominal: Soft. Bowel sounds are normal. She exhibits no distension and no mass. There is no tenderness. There is no rebound and no guarding.  Genitourinary:       Patient with several swollen edematous hemorrhoids. No active bleeding, rectal vault empty no bleeding. Rectal exam extremely uncomfortable for the patient  Musculoskeletal: Normal range of motion. She exhibits no edema and no tenderness.  Lymphadenopathy:    She has no cervical adenopathy.  Neurological: She is alert and oriented to person, place, and time.  Skin: Skin is warm and dry. No rash noted. She is not diaphoretic. No erythema. No pallor.  ED Course  Procedures (including critical care time)  Labs Reviewed  CBC WITH DIFFERENTIAL - Abnormal; Notable for the following:    WBC 3.7 (*)     Hemoglobin 11.5 (*)     HCT 35.0 (*)     All other components within normal limits   No results found.   1. Rectal bleeding   2. Inflamed internal hemorrhoid       MDM  42 year old female with bright red blood per rectum with inflamed hemorrhoids. Do not feel like this is a diverticular bleed, diverticulitis, fistula or upper GI source. Will treat with topical steroids. We'll refer to PCP/GI prn        Olivia Mackie,  MD 03/26/12 [phone]

## 2012-03-26 NOTE — ED Notes (Addendum)
Pt states that she had a colonostomy 2 years ago. Pt states that she noticed today that her bowel were red. Pt states bowel movement yesterday was not red. Pt states that the toliet is bright red and that when she wiped she had bright red blood on her TP. Pt states painful to sit, pt states hx of hemroids.

## 2012-03-26 NOTE — ED Notes (Signed)
Pt. Given cream and instructed on use.

## 2012-04-04 ENCOUNTER — Emergency Department (HOSPITAL_COMMUNITY)
Admission: EM | Admit: 2012-04-04 | Discharge: 2012-04-04 | Disposition: A | Discharge status: Home / Self Care | Payer: Medicare Other | Attending: Emergency Medicine | Admitting: Emergency Medicine

## 2012-04-04 ENCOUNTER — Encounter (HOSPITAL_COMMUNITY): Payer: Self-pay | Admitting: Emergency Medicine

## 2012-04-04 DIAGNOSIS — R001 Bradycardia, unspecified: Secondary | ICD-10-CM

## 2012-04-04 DIAGNOSIS — Z79899 Other long term (current) drug therapy: Secondary | ICD-10-CM | POA: Insufficient documentation

## 2012-04-04 DIAGNOSIS — I498 Other specified cardiac arrhythmias: Secondary | ICD-10-CM | POA: Insufficient documentation

## 2012-04-04 DIAGNOSIS — F509 Eating disorder, unspecified: Secondary | ICD-10-CM | POA: Insufficient documentation

## 2012-04-04 DIAGNOSIS — Z8659 Personal history of other mental and behavioral disorders: Secondary | ICD-10-CM

## 2012-04-04 DIAGNOSIS — Z87891 Personal history of nicotine dependence: Secondary | ICD-10-CM | POA: Insufficient documentation

## 2012-04-04 DIAGNOSIS — Z87448 Personal history of other diseases of urinary system: Secondary | ICD-10-CM | POA: Insufficient documentation

## 2012-04-04 NOTE — ED Provider Notes (Signed)
History     CSN: [id number]  Arrival date & time 04/04/12  1826   First MD Initiated Contact with Patient 04/04/12 2104      Chief Complaint  Patient presents with  . Bradycardia    (Consider location/radiation/quality/duration/timing/severity/associated sxs/prior treatment) HPI Comments: Patient is a 42 y/o female who presents to the ED with a chief complaint of bradycardia.  She had an EKG earlier at [facility] while checking into an eating disorder clinic and they told her to come to the ED to get her heart checked.  Patient has had an eating disorder since she was a teenager.  Patient is currently not experiencing any symptoms.  She denies any chest pain, palpitations, shortness of breath, fever, chills, dizziness, or lightheadedness at this time.  Review of the chart shows that patient has had bradycardia in the past.  Patient denies any prior cardiac disease.  She reports that she does exercise several hours a day.    The history is provided by the patient. No language interpreter was used.    Past Medical History  Diagnosis Date  . Kidney infection     Past Surgical History  Procedure Date  . Tubal ligation     No family history on file.  History  Substance Use Topics  . Smoking status: Former Games developer  . Smokeless tobacco: Not on file  . 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      OB History    Grav Para Term Preterm Abortions TAB SAB Ect Mult Living                  Review of Systems  Constitutional: Negative for fever and chills.  Respiratory: Negative for shortness of breath.   Cardiovascular: Negative for chest pain.  Gastrointestinal:       Self induced vomiting  Neurological: Negative for dizziness, syncope, weakness and numbness.  Psychiatric/Behavioral: Negative for confusion.  All other systems reviewed and are negative.    Allergies  Penicillins  Home Medications   Current Outpatient Rx    Name  Route  Sig  Dispense  Refill  . MAGNESIUM PO   Oral   Take 0.5 tablets by mouth daily.         Marland Kitchen DAILY WOMENS HEALTH FORMULA PO TABS   Oral   Take by mouth every morning.           BP 96/62  Pulse 50  Temp 99.1 F (37.3 C) (Oral)  Resp 15  SpO2 99%  Physical Exam  Nursing note and vitals reviewed. Constitutional: She is oriented to person, place, and time. No distress.       Patient very thin appearing  HENT:  Head: Normocephalic and atraumatic.  Mouth/Throat: Oropharynx is clear and moist.  Eyes: Conjunctivae normal and EOM are normal. Pupils are equal, round, and reactive to light. Right eye exhibits no discharge. Left eye exhibits no discharge. No scleral icterus.  Neck: Normal range of motion. Neck supple.  Cardiovascular: Normal rate, regular rhythm and normal heart sounds.  Exam reveals no gallop and no friction rub.   No murmur heard. Pulmonary/Chest: Effort normal and breath sounds normal. No respiratory distress. She has no wheezes. She has no rales. She exhibits no tenderness.  Abdominal: Soft. She exhibits no distension and no mass. There is no tenderness. There is no rebound and no guarding.  Musculoskeletal: Normal range of motion.  Neurological: She  is alert and oriented to person, place, and time. No cranial nerve deficit. Coordination normal.  Skin: Skin is warm and dry. She is not diaphoretic.  Psychiatric: She has a normal mood and affect. Her speech is normal and behavior is normal.    ED Course  Procedures (including critical care time)  Labs Reviewed - No data to display No results found.   1. Bradycardia   2. History of eating disorder      Date: 04/05/2012  Rate: 72  Rhythm: sinus arrhythmia  QRS Axis: normal  Intervals: normal  ST/T Wave abnormalities: nonspecific T wave changes  Conduction Disutrbances:none  Narrative Interpretation: Dr. Anitra Lauth also reviewed EKG  Old EKG Reviewed: none available    MDM  Patient  presenting for evaluation of bradycardia.  Patient was found to be bradycardic earlier today while checking into an Eating Disorder facility.  She was therefore sent to the ED for further evaluation.  She is currently asymptomatic.  Patient exercises several hours a day, which is a likely reason for the bradycardia.  EKG shows a HR of 72.  On the cardiac monitor in the patient's room her HR fluctuates between 55-65 bpm.  Feel that patient can be discharged home.  Return precautions discussed.          Pascal Lux Howard, PA-C 04/05/12 1948

## 2012-04-04 NOTE — ED Notes (Signed)
Pt dc to home.  Pt states understanding to dc instructions.  Pt will f/u with pcp as directed.

## 2012-04-04 NOTE — ED Notes (Signed)
Pt st's she was at [facility] today and had EKG which was abnormal.  Pt is in a program there for eating disorder but she lives in [place] and wanted to come to this ED.

## 2012-04-04 NOTE — ED Notes (Signed)
Patients vitals not recorded in triage.

## 2012-04-07 NOTE — ED Provider Notes (Signed)
Medical screening examination/treatment/procedure(s) were performed by non-physician practitioner and as supervising physician I was immediately available for consultation/collaboration.   Lucianna Ostlund, MD 04/07/12 1535 

## 2012-05-02 ENCOUNTER — Ambulatory Visit: Payer: Medicare Other | Admitting: Family Medicine

## 2012-05-06 ENCOUNTER — Encounter (INDEPENDENT_AMBULATORY_CARE_PROVIDER_SITE_OTHER): Payer: Self-pay

## 2012-05-07 ENCOUNTER — Ambulatory Visit (INDEPENDENT_AMBULATORY_CARE_PROVIDER_SITE_OTHER): Payer: Medicare Other | Admitting: General Surgery

## 2012-05-07 ENCOUNTER — Telehealth (INDEPENDENT_AMBULATORY_CARE_PROVIDER_SITE_OTHER): Payer: Self-pay

## 2012-05-07 ENCOUNTER — Encounter (INDEPENDENT_AMBULATORY_CARE_PROVIDER_SITE_OTHER): Payer: Self-pay | Admitting: General Surgery

## 2012-05-07 VITALS — BP 110/68 | HR 66 | Temp 99.0°F | Resp 12 | Ht 67.0 in | Wt 111.2 lb

## 2012-05-07 DIAGNOSIS — K644 Residual hemorrhoidal skin tags: Secondary | ICD-10-CM

## 2012-05-07 DIAGNOSIS — K603 Anal fistula, unspecified: Secondary | ICD-10-CM | POA: Insufficient documentation

## 2012-05-07 DIAGNOSIS — K648 Other hemorrhoids: Secondary | ICD-10-CM

## 2012-05-07 DIAGNOSIS — K602 Anal fissure, unspecified: Secondary | ICD-10-CM | POA: Insufficient documentation

## 2012-05-07 NOTE — Assessment & Plan Note (Signed)
Think patient's symptoms are coming from an anal fissure. Although she does have hemorrhoids, her history of acute onset of bleeding and severe pain is more consistent with a fissure. Especially since she is unable to allow more than a digital exam with my smallest finger.  I have recommended that she use diltiazem gel to her perianal region 4 times a day. I reviewed in bowel habits including taking stool softeners, drinking lots of water, sitz baths, and avoiding prolonged sitting on the toilet.  She has complaints about her pelvic floor strength and feels like she is unable to use the bathroom. I advised her that if we cannot get this better, I would referred to the colorectal surgeon.

## 2012-05-07 NOTE — Patient Instructions (Signed)
Do diltiazem cream to anus 4 times/day.  Take colace 100 mg BID  Take Miralax 17 gm every other day.  Continue drinking 8 glasses water/day.  Follow up in 8 weeks.

## 2012-05-07 NOTE — Progress Notes (Addendum)
Patient ID: Cassandra Cunningham, female   DOB: Aug 10, 1970, 42 y.o.   MRN: [id number]  Chief Complaint  Patient presents with  . Rectal Problems    HPI Cassandra Cunningham is a 42 y.o. female.   HPI Pt is a 43 year old female with complaints of rectal bleeding and pain over the last few months.  She was hospitalized at [facility] for anorexia with severe malnutrition.  While there, she became extremely constipated.  She started having numerous bloody bowel movements. She used proctofoam for a bit, which helped some.  These improved a little once she got home, but she continues to have pain and pressure.  She states that she has been told her "pelvic floor is weak."  She has had to assist herself with BMs for a long time with enemas and digital manipulation.  She has tried stool softeners.  She drinks lots of water.  Pt seen in consultation at the request of Dr. Greggory Stallion Osei-Bonsu.    Past Medical History  Diagnosis Date  . Kidney infection   . Hemorrhoids     bleeding    Past Surgical History  Procedure Laterality Date  . Tubal ligation    . Cesarean section    . Wisdon teeth      History reviewed. No pertinent family history.  Social History History  Substance Use Topics  . Smoking status: Former Games developer  . Smokeless tobacco: Not on file  . 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      Allergies  Allergen Reactions  . Penicillins Other (See Comments)    "Just don't give"    Current Outpatient Prescriptions  Medication Sig Dispense Refill  . MAGNESIUM PO Take 0.5 tablets by mouth daily.      . Multiple Vitamins-Minerals (DAILY WOMENS HEALTH FORMULA) TABS Take by mouth every morning.       No current facility-administered medications for this visit.    Review of Systems Review of Systems  Cardiovascular: Positive for leg swelling.  Gastrointestinal: Positive for abdominal pain, constipation, blood in stool, anal bleeding and rectal pain.     Blood pressure 110/68, pulse 66, temperature 99 F (37.2 C), temperature source Temporal, resp. rate 12, height 5\' 7"  (1.702 m), weight 111 lb 3.2 oz (50.44 kg).  Physical Exam Physical Exam  Constitutional: She is oriented to person, place, and time. She appears well-developed. She appears distressed.  Tearful, very thin  HENT:  Head: Normocephalic and atraumatic.  Eyes: Conjunctivae are normal. No scleral icterus.  Cardiovascular: Normal rate.   Pulmonary/Chest: Effort normal. No respiratory distress.  Abdominal: Soft.  Musculoskeletal: Normal range of motion.  Neurological: She is alert and oriented to person, place, and time. Coordination normal.  Skin: Skin is dry. No rash noted. She is not diaphoretic. No erythema. No pallor.  Psychiatric: Her behavior is normal. Thought content normal.  Pt tearful, flat affect     Assessment/Plan     Anal fissure Think patient's symptoms are coming from an anal fissure. Although she does have hemorrhoids, her history of acute onset of bleeding and severe pain is more consistent with a fissure. Especially since she is unable to allow more than a digital exam with my smallest finger.  I have recommended that she use diltiazem gel to her perianal region 4 times a day. I reviewed in bowel habits including taking stool softeners, drinking lots of water, sitz baths, and avoiding prolonged sitting  on the toilet.  She has complaints about her pelvic floor strength and feels like she is unable to use the bathroom. I advised her that if we cannot get this better, I would referred to the colorectal surgeon.   External hemorrhoids.  Will benefit from bowel habit improvements. Will work on after treatment for fissure.   Cassandra Cunningham 05/07/2012, 2:08 PM

## 2012-06-12 ENCOUNTER — Encounter (INDEPENDENT_AMBULATORY_CARE_PROVIDER_SITE_OTHER): Payer: Self-pay | Admitting: General Surgery

## 2012-06-19 NOTE — Telephone Encounter (Signed)
No notes in this encounter. 

## 2012-07-10 ENCOUNTER — Other Ambulatory Visit: Payer: Self-pay

## 2012-07-10 DIAGNOSIS — Z1231 Encounter for screening mammogram for malignant neoplasm of breast: Secondary | ICD-10-CM

## 2012-07-23 ENCOUNTER — Ambulatory Visit (INDEPENDENT_AMBULATORY_CARE_PROVIDER_SITE_OTHER): Payer: Medicare Other | Admitting: General Surgery

## 2012-07-23 ENCOUNTER — Encounter (INDEPENDENT_AMBULATORY_CARE_PROVIDER_SITE_OTHER): Payer: Self-pay | Admitting: General Surgery

## 2012-07-23 VITALS — BP 104/68 | HR 64 | Resp 16 | Ht 67.75 in | Wt 108.8 lb

## 2012-07-23 DIAGNOSIS — K602 Anal fissure, unspecified: Secondary | ICD-10-CM

## 2012-07-23 DIAGNOSIS — K648 Other hemorrhoids: Secondary | ICD-10-CM

## 2012-07-23 DIAGNOSIS — K644 Residual hemorrhoidal skin tags: Secondary | ICD-10-CM

## 2012-07-23 NOTE — Patient Instructions (Addendum)
Continue miralax.  Follow up in 8 weeks.  Avoid spending a long time on the toilet.

## 2012-07-26 NOTE — Assessment & Plan Note (Signed)
Anal fissure seems resolved. Would hold on further treatment.

## 2012-07-26 NOTE — Progress Notes (Signed)
HISTORY: Patient is a 42 year old female that I saw several months ago for rectal pain. She she was unable to tolerate digital exam or anoscopy at the last visit.  She was treated as having an anal fissure presumptively based on symptoms of sharp tearing anal pain with bowel movements, 3 weeks of constipation, and blood with bowel movements.  She had been hospitalized for anorexia at that time at [facility].  She was placed on MiraLAX. She says the pain has improved significantly. She does continue to have some soreness and bleeding. She has been having much better bowel habits.   PERTINENT REVIEW OF SYSTEMS: Otherwise negative x11 Filed Vitals:   07/23/12 1717  BP: 104/68  Pulse: 64  Resp: 16    EXAM: Head: Normocephalic and atraumatic.  Eyes:  Conjunctivae are normal. Pupils are equal, round, and reactive to light. No scleral icterus.  Neck:  Normal range of motion. Neck supple.  Resp: No respiratory distress, normal effort. Rectal:  Circumferential small external hemorrhoids that do not appear inflamed. Patient was able tolerate digital exam and anoscopy. She did have quite large internal hemorrhoids. These were injected circumferentially. Neurological: Alert and oriented to person, place, and time. Coordination normal.  Skin: Skin is warm and dry. No rash noted. No diaphoretic. No erythema. No pallor.  Psychiatric: Normal mood and affect. Normal behavior. Judgment and thought content normal.      ASSESSMENT AND PLAN:   Anal fissure Anal fissure seems resolved. Would hold on further treatment.  Internal and external bleeding hemorrhoids Internal hemorrhoids are injected.  She is given instructions to avoid constipation. She is advised to continue taking the MiraLAX and stool softeners. I am not going to start her on any additional medication at this time.  I will see her back in 8 weeks.         Maudry Diego, MD Surgical Oncology, General & Endocrine Surgery [facility], Mosetta Pigeon, MD Jackie Plum, MD

## 2012-07-26 NOTE — Assessment & Plan Note (Addendum)
Internal hemorrhoids are injected.  She is given instructions to avoid constipation. She is advised to continue taking the MiraLAX and stool softeners. I am not going to start her on any additional medication at this time.  I will see her back in 8 weeks.

## 2012-07-30 ENCOUNTER — Telehealth (INDEPENDENT_AMBULATORY_CARE_PROVIDER_SITE_OTHER): Payer: Self-pay | Admitting: General Surgery

## 2012-07-30 NOTE — Telephone Encounter (Signed)
Patient calling in asking for a prescription for miralax. She would like to increase this to twice daily and would like a prescription so that her insurance will cover it. Please advise. [phone].

## 2012-08-01 ENCOUNTER — Ambulatory Visit
Admission: RE | Admit: 2012-08-01 | Discharge: 2012-08-01 | Disposition: A | Discharge status: Home / Self Care | Payer: Medicare Other | Source: Ambulatory Visit

## 2012-08-01 DIAGNOSIS — Z1231 Encounter for screening mammogram for malignant neoplasm of breast: Secondary | ICD-10-CM

## 2012-09-24 ENCOUNTER — Ambulatory Visit (INDEPENDENT_AMBULATORY_CARE_PROVIDER_SITE_OTHER): Payer: Medicare Other | Admitting: General Surgery

## 2012-09-24 ENCOUNTER — Encounter (INDEPENDENT_AMBULATORY_CARE_PROVIDER_SITE_OTHER): Payer: Self-pay | Admitting: General Surgery

## 2012-09-24 VITALS — BP 110/66 | HR 66 | Temp 100.1°F | Resp 15 | Ht 67.75 in | Wt 109.4 lb

## 2012-09-24 DIAGNOSIS — K644 Residual hemorrhoidal skin tags: Secondary | ICD-10-CM

## 2012-09-24 DIAGNOSIS — K648 Other hemorrhoids: Secondary | ICD-10-CM

## 2012-09-24 MED ORDER — HYDROCORTISONE 2.5 % RE CREA: TOPICAL_CREAM | Freq: Two times a day (BID) | RECTAL | Status: DC

## 2012-09-24 NOTE — Assessment & Plan Note (Signed)
Pt needs to be more aggressive with treating constipation.  She was taking miralax twice daily without improvement. Will try every other day milk of magnesia. Injected circumferentially again. Will try anusol cream for hemorrhoids.  Will have see Dr. Maisie Fus at next appt.

## 2012-09-24 NOTE — Progress Notes (Signed)
HISTORY: Patient is a 42 year old female that I have been seeing for rectal pain.  She had anal fissure presumably at first visit and responded to diltiazem cream.   She had been hospitalized for anorexia at that time at [facility].  At last visit, she was continuing to have some soreness and bleeding, but less sharp pain.  I injected circumferential hemorrhoids at that time.  She returns with continued symptoms.  She had a little improvement right away, but now is back to having significant soreness and pressure.  She has been taking stool softeners, and tried to take miralax, but has not had good resolution of constipation with the miralax.  The bleeding has mostly improved. She has issues when she walks and exercises with "horrible rectal pain."     PERTINENT REVIEW OF SYSTEMS: Otherwise negative x11 Filed Vitals:   09/24/12 1535  BP: 110/66  Pulse: 66  Temp: 100.1 F (37.8 C)  Resp: 15    EXAM: Head: Normocephalic and atraumatic.  Eyes:  Conjunctivae are normal. Pupils are equal, round, and reactive to light. No scleral icterus.  Neck:  Normal range of motion. Neck supple.  Resp: No respiratory distress, normal effort. Rectal: External hemorrhoid anteriorly that is moderate to large.  Anoscopy revealed internal hemorrhoids circumferentially.   Neurological: Alert and oriented to person, place, and time. Coordination normal.  Skin: Skin is warm and dry. No rash noted. No diaphoretic. No erythema. No pallor.  Psychiatric: Normal mood and affect. Normal behavior. Judgment and thought content normal.      ASSESSMENT AND PLAN:   Internal and external bleeding hemorrhoids Pt needs to be more aggressive with treating constipation.  She was taking miralax twice daily without improvement. Will try every other day milk of magnesia. Injected circumferentially again. Will try anusol cream for hemorrhoids.  Will have see Dr. Maisie Fus at next appt.         Maudry Diego, MD Surgical  Oncology, General & Endocrine Surgery [facility], Mosetta Pigeon, MD No ref. provider found

## 2012-09-24 NOTE — Patient Instructions (Signed)
Apply cream inside and around anus three times daily.  Continue stool softener.    Add milk of magnesia every other day.  Follow up with Dr. Maisie Fus.  Drink lots of water.

## 2012-11-07 ENCOUNTER — Encounter (INDEPENDENT_AMBULATORY_CARE_PROVIDER_SITE_OTHER): Payer: Self-pay | Admitting: General Surgery

## 2012-11-07 ENCOUNTER — Ambulatory Visit (INDEPENDENT_AMBULATORY_CARE_PROVIDER_SITE_OTHER): Payer: Medicare Other | Admitting: General Surgery

## 2012-11-07 VITALS — BP 92/56 | HR 52 | Resp 16 | Ht 66.75 in | Wt 109.6 lb

## 2012-11-07 DIAGNOSIS — K5900 Constipation, unspecified: Secondary | ICD-10-CM

## 2012-11-07 DIAGNOSIS — K6289 Other specified diseases of anus and rectum: Secondary | ICD-10-CM

## 2012-11-07 DIAGNOSIS — M62838 Other muscle spasm: Secondary | ICD-10-CM | POA: Insufficient documentation

## 2012-11-07 NOTE — Progress Notes (Signed)
Chief Complaint  Patient presents with  . Follow-up    reck hems    HISTORY: Cassandra Cunningham is a 42 y.o. female who presents to the office with rectal bleeding.  Other symptoms include pain.  This had been occurring for about 2 yrs.  She has tried injections, laxatives in the past with little success.  Dietary changes makes the symptoms worse.   It is continuous in nature.  Her bowel habits are irregular and her bowel movements are hard.  Her fiber intake is dietary.  Her last colonoscopy was about 3 yrs ago, and she states there was a polyp removed.      Past Medical History  Diagnosis Date  . Kidney infection   . Hemorrhoids     bleeding      Past Surgical History  Procedure Laterality Date  . Tubal ligation    . Cesarean section    . Wisdon teeth          Current Outpatient Prescriptions  Medication Sig Dispense Refill       0  . MAGNESIUM PO Take 0.5 tablets by mouth daily.      . Multiple Vitamins-Minerals (DAILY WOMENS HEALTH FORMULA) TABS Take by mouth every morning.       Senna PRN      . terbinafine (LAMISIL) 250 MG tablet Take 250 mg by mouth daily.       No current facility-administered medications for this visit.      Allergies  Allergen Reactions  . Penicillins Other (See Comments)    "Just don't give"      Family History  Problem Relation Age of Onset  . Mental illness Mother   . Heart disease Father   . Diabetes Sister   . Diabetes Brother   . Heart disease Paternal Grandfather     History   Social History  . Marital Status: Divorced    Spouse Name: N/A    Number of Children: N/A  . Years of Education: N/A   Social History Main Topics  . Smoking status: Former Games developer  . Smokeless tobacco: None  . 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    . Drug Use: No  . Sexual Activity: Yes    Birth Control/ Protection: Surgical   Other Topics Concern  . None   Social History Narrative  . None       REVIEW OF SYSTEMS - PERTINENT POSITIVES ONLY: Review of Systems - General ROS: negative for - chills, fever or weight loss Hematological and Lymphatic ROS: negative for - bleeding problems, blood clots or bruising Respiratory ROS: no cough, shortness of breath, or wheezing Cardiovascular ROS: no chest pain or dyspnea on exertion Gastrointestinal ROS: no abdominal pain, change in bowel habits, or black or bloody stools Genito-Urinary ROS: no dysuria, trouble voiding, or hematuria  EXAM: Filed Vitals:   11/07/12 1555  BP: 92/56  Pulse: 52  Resp: 16    General appearance: alert and cooperative GI: normal findings: soft, non-tender   Procedure: Anoscopy Surgeon: Cassandra Cunningham Diagnosis: anal pain, bleeding  Assistant: Cassandra Cunningham After the risks and benefits were explained, verbal consent was obtained for above procedure  Anesthesia: none Findings: grade 2 internal hemorrhoids, non-inflamed, non-bleeding, severe Left levator spasm, Right levator normal, there was a laxity in the anal canal on the left side (almost like a lateral rectocele)    ASSESSMENT AND PLAN:  Cassandra Cunningham is a  42 y.o. 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  I have recommended that she see Amalia Greenhouse at [facility] for biofeedback therapy.  I have also given her a list of exercises for her to do at home.  We discussed that her hemorrhoid disease is minimal and that if I did anything to her hemorrhoids, it would not work long term unless she got her constipation under control.  It sounds like she is getting quite a bit of fiber and water in her diet.  I suspect she is having some pelvic floor dysfunction.  This may need evaluation by MR defecography if biofeedback doesn't work.  If she continues to have levator spasms, she may also benefit from botox injections.  I have asked that she return to the office after she completes biofeedback.        Vanita Panda, MD Colon and Rectal Surgery / General  Surgery [facility]      Visit Diagnoses: 1. Levator spasm     Primary Care Physician: Jackie Plum, MD

## 2012-11-07 NOTE — Patient Instructions (Addendum)
Levator Ani Spasms  Levator syndrome (LS), also known as proctalgia fugax and chronic anal pain syndrome, is a benign disorder characterized by brief, episodic attacks of rectal discomfort and pain of varying severity.  Although the term levator syndrome suggests that spasm of the levator ani muscles is the cause of symptoms, it is still unclear that this mechanism is responsible for all cases of functional rectal discomfort/pain.  Attacks often occur suddenly at night, waking patients from sleep.  In some cases, attacks may occur when the patient is straining at stool or after a bowel movement.  The pain usually lasts for seconds to minutes, and then disappears completely.  Most patients have less than 6 episodes a year, however, some patients experience attacks very frequently.  The condition generally does not appear until after puberty and the frequency and severity of attacks decrease after age 23.  The site of pain is located in the upper anal canal, just above the anal sphincter.  Precipitating factors for this disorder include anxiety, stress, heat, cold, or fatigue ([place], [place]).Treatments of patients with LS include high-fiber diet, withdrawal of drugs which have gut effects (e.g., drugs that provoke or worsen constipation including narcotics and calcium channel blockers; drugs that provoke or worsen diarrhea including quinidine, theophylline, and antibiotics), warm baths, rectal massage, perineal strengthening exercises, anti-cholinergic agents, non-narcotic analgesics, sedatives or muscle relaxants.   Fiber Chart  You should 25-30g of fiber per day and drinking 8 glasses of water to help your bowels move regularly.  In the chart below you can look up how much fiber you are getting in an average day.  If you are not getting enough fiber, you should add a fiber supplement to your diet.  Examples of this include Metamucil, FiberCon and Citrucel.  These can be purchased at your local  grocery store or pharmacy.      [email or web address]    GETTING TO GOOD BOWEL HEALTH. Irregular bowel habits such as constipation can lead to many problems over time.  Having one soft bowel movement a day is the most important way to prevent further problems.  The anorectal canal is designed to handle stretching and feces to safely manage our ability to get rid of solid waste (feces, poop, stool) out of our body.  BUT, hard constipated stools can act like ripping concrete bricks causing inflamed hemorrhoids, anal fissures, abdominal pain and bloating.     The goal: ONE SOFT BOWEL MOVEMENT A DAY!  To have soft, regular bowel movements:    Drink at least 8 tall glasses of water a day.     Take plenty of fiber.  Fiber is the undigested part of plant food that passes into the colon, acting s "natures broom" to encourage bowel motility and movement.  Fiber can absorb and hold large amounts of water. This results in a larger, bulkier stool, which is soft and easier to pass. Work gradually over several weeks up to 6 servings a day of fiber (25g a day even more if needed) in the form of: o Vegetables -- Root (potatoes, carrots, turnips), leafy green (lettuce, salad greens, celery, spinach), or cooked high residue (cabbage, broccoli, etc) o Fruit -- Fresh (unpeeled skin & pulp), Dried (prunes, apricots, cherries, etc ),  or stewed ( applesauce)  o Whole grain breads, pasta, etc (whole wheat)  o Bran cereals    Bulking Agents -- This type of water-retaining fiber generally is easily obtained each day by one of the following:  o Psyllium bran -- The psyllium plant is remarkable because its ground seeds can retain so much water. This product is available as Metamucil, Konsyl, Effersyllium, Per Diem Fiber, or the less expensive generic preparation in drug and health food stores. Although labeled a laxative, it really is not a laxative.  o Methylcellulose  -- This is another fiber derived from wood which also retains water. It is available as Citrucel. o Polyethylene Glycol - and "artificial" fiber commonly called Miralax or Glycolax.  It is helpful for people with gassy or bloated feelings with regular fiber o Flax Seed - a less gassy fiber than psyllium   No reading or other relaxing activity while on the toilet. If bowel movements take longer than 5 minutes, you are too constipated   AVOID CONSTIPATION.  High fiber and water intake usually takes care of this.  Sometimes a laxative is needed to stimulate more frequent bowel movements, but    Laxatives are not a good long-term solution as it can wear the colon out. o Osmotics (Milk of Magnesia, Fleets phosphosoda, Magnesium citrate, MiraLax, GoLytely) are safer than  o Stimulants (Senokot, Castor Oil, Dulcolax, Ex Lax)    o Do not take laxatives for more than 7days in a row.    IF SEVERELY CONSTIPATED, try a Bowel Retraining Program: o Do not use laxatives.  o Eat a diet high in roughage, such as bran cereals and leafy vegetables.  o Drink six (6) ounces of prune or apricot juice each morning.  o Eat two (2) large servings of stewed fruit each day.  o Take one (1) heaping tablespoon of a psyllium-based bulking agent twice a day. Use sugar-free sweetener when possible to avoid excessive calories.  o Eat a normal breakfast.  o Set aside 15 minutes after breakfast to sit on the toilet, but do not strain to have a bowel movement.  o If you do not have a bowel movement by the third day, use an enema and repeat the above steps.    HEMORRHOIDS    Did you know... Hemorrhoids are one of the most common ailments known.  More than half the population will develop hemorrhoids, usually after age 63.  Millions of Americans currently suffer from hemorrhoids.  The average person suffers in silence for a long period before seeking medical care.  Today's treatment methods make some types of hemorrhoid  removal much less painful.  What are hemorrhoids? Often described as "varicose veins of the anus and rectum", hemorrhoids are enlarged, bulging blood vessels in and about the anus and lower rectum. There are two types of hemorrhoids: external and internal, which refer to their location.  External (outside) hemorrhoids develop near the anus and are covered by very sensitive skin. These are usually painless. However, if a blood clot (thrombosis) develops in an external hemorrhoid, it becomes a painful, hard lump. The external hemorrhoid may bleed if it ruptures. Internal (inside) hemorrhoids develop within the anus beneath the lining. Painless bleeding and protrusion during bowel movements are the most common symptom. However, an internal hemorrhoid can cause severe pain if it is completely "prolapsed" - protrudes from the anal opening and cannot be pushed back inside.   What causes hemorrhoids? An exact cause is unknown; however, the upright posture of humans alone forces a great deal of pressure on the rectal veins, which sometimes causes them to bulge. Other contributing factors include:  . Aging  . Chronic constipation or diarrhea  . Pregnancy  . Heredity  .  Straining during bowel movements  . Faulty bowel function due to overuse of laxatives or enemas . Spending long periods of time (e.g., reading) on the toilet  Whatever the cause, the tissues supporting the vessels stretch. As a result, the vessels dilate; their walls become thin and bleed. If the stretching and pressure continue, the weakened vessels protrude.  What are the symptoms? If you notice any of the following, you could have hemorrhoids:  . Bleeding during bowel movements  . Protrusion during bowel movements . Itching in the anal area  . Pain  . Sensitive lump(s)  How are hemorrhoids treated? Mild symptoms can be relieved frequently by increasing the amount of fiber (e.g., fruits, vegetables, breads and cereals) and fluids  in the diet. Eliminating excessive straining reduces the pressure on hemorrhoids and helps prevent them from protruding. A sitz bath - sitting in plain warm water for about 10 minutes - can also provide some relief . With these measures, the pain and swelling of most symptomatic hemorrhoids will decrease in two to seven days, and the firm lump should recede within four to six weeks. In cases of severe or persistent pain from a thrombosed hemorrhoid, your physician may elect to remove the hemorrhoid containing the clot with a small incision. Performed under local anesthesia as an outpatient, this procedure generally provides relief. Severe hemorrhoids may require special treatment, much of which can be performed on an outpatient basis.  . Ligation - the rubber band treatment - works effectively on internal hemorrhoids that protrude with bowel movements. A small rubber band is placed over the hemorrhoid, cutting off its blood supply. The hemorrhoid and the band fall off in a few days and the wound usually heals in a week or two. This procedure sometimes produces mild discomfort and bleeding and may need to be repeated for a full effect.  There is a more intense version of this procedure that is done in the OR as outpatient surgery called THD.  It involves identifying blood vessels leading to the hemorrhoids and then tying them off with sutures.  This method is a little more painful than rubber band ligation but less painful than traditional hemorrhoidectomy and usually does not have to be repeated.  It is best for internal hemorrhoids that bleed.  Rubber Band Ligation of Internal Hemorrhoids:  A.  Bulging, bleeding, internal hemorrhoid B.  Rubber band applied at the base of the hemorrhoid C.  About 7 days later, the banded hemorrhoid has fallen off leaving a small scar (arrow)  . Injection and Coagulation can also be used on bleeding hemorrhoids that do not protrude. Both methods are relatively painless  and cause the hemorrhoid to shrivel up. Marland Kitchen Hemorrhoidectomy - surgery to remove the hemorrhoids - is the most complete method for removal of internal and external hemorrhoids. It is necessary when (1) clots repeatedly form in external hemorrhoids; (2) ligation fails to treat internal hemorrhoids; (3) the protruding hemorrhoid cannot be reduced; or (4) there is persistent bleeding. A hemorrhoidectomy removes excessive tissue that causes the bleeding and protrusion. It is done under anesthesia using sutures, and may, depending upon circumstances, require hospitalization and a period of inactivity. Laser hemorrhoidectomies do not offer any advantage over standard operative techniques. They are also quite expensive, and contrary to popular belief, are no less painful.  Do hemorrhoids lead to cancer? No. There is no relationship between hemorrhoids and cancer. However, the symptoms of hemorrhoids, particularly bleeding, are similar to those of colorectal cancer and other  diseases of the digestive system. Therefore, it is important that all symptoms are investigated by a physician specially trained in treating diseases of the colon and rectum and that everyone 50 years or older undergo screening tests for colorectal cancer. Do not rely on over-the-counter medications or other self-treatments. See a colorectal surgeon first so your symptoms can be properly evaluated and effective treatment prescribed.  2012 American Society of Colon & Rectal Surgeons

## 2012-11-08 ENCOUNTER — Telehealth (INDEPENDENT_AMBULATORY_CARE_PROVIDER_SITE_OTHER): Payer: Self-pay | Admitting: General Surgery

## 2012-11-08 NOTE — Telephone Encounter (Signed)
LMOM asking pt to return my call.  This is because I need to let her know that she is scheduled with [facility]'s PT program on 11/25/12 at 11:00.  Also need to let her know that the address is [address]

## 2012-11-15 NOTE — Telephone Encounter (Signed)
Left message for pt to call me or Penni Bombard to let her know Cassandra Cunningham the PT at [facility] is not contracted with Medicaid.  Only her Medicare will pay for her visit.

## 2012-11-19 ENCOUNTER — Emergency Department (HOSPITAL_COMMUNITY)
Admission: EM | Admit: 2012-11-19 | Discharge: 2012-11-19 | Disposition: A | Discharge status: Home / Self Care | Payer: Medicare Other | Attending: Emergency Medicine | Admitting: Emergency Medicine

## 2012-11-19 ENCOUNTER — Emergency Department (HOSPITAL_COMMUNITY): Payer: Medicare Other

## 2012-11-19 ENCOUNTER — Encounter (HOSPITAL_COMMUNITY): Payer: Self-pay | Admitting: Emergency Medicine

## 2012-11-19 DIAGNOSIS — Z87891 Personal history of nicotine dependence: Secondary | ICD-10-CM | POA: Insufficient documentation

## 2012-11-19 DIAGNOSIS — Z880 Allergy status to penicillin: Secondary | ICD-10-CM | POA: Insufficient documentation

## 2012-11-19 DIAGNOSIS — Z87448 Personal history of other diseases of urinary system: Secondary | ICD-10-CM | POA: Insufficient documentation

## 2012-11-19 DIAGNOSIS — Z8679 Personal history of other diseases of the circulatory system: Secondary | ICD-10-CM | POA: Insufficient documentation

## 2012-11-19 DIAGNOSIS — M79672 Pain in left foot: Secondary | ICD-10-CM

## 2012-11-19 DIAGNOSIS — M79609 Pain in unspecified limb: Secondary | ICD-10-CM | POA: Insufficient documentation

## 2012-11-19 DIAGNOSIS — Z79899 Other long term (current) drug therapy: Secondary | ICD-10-CM | POA: Insufficient documentation

## 2012-11-19 MED ORDER — NAPROXEN 500 MG PO TABS: 500.0000 mg | ORAL_TABLET | Freq: Two times a day (BID) | ORAL | Status: DC

## 2012-11-19 NOTE — ED Provider Notes (Signed)
CSN: [id number]     Arrival date & time 11/19/12  1010 History   First MD Initiated Contact with Patient 11/19/12 1208     Chief Complaint  Patient presents with  . Foot Pain   (Consider location/radiation/quality/duration/timing/severity/associated sxs/prior Treatment) HPI Comments: Patient is a 42 year old female who presents with left foot pain since this morning. Patient reports she was taking her trash out this morning and had sudden onset of aching and severe left foot pain without radiation. Patient denies any injury to the foot. Walking and palpation of the area makes the pain worse. Nothing makes the pain better. Patient has not tried anything for pain relief.    Past Medical History  Diagnosis Date  . Kidney infection   . Hemorrhoids     bleeding   Past Surgical History  Procedure Laterality Date  . Tubal ligation    . Cesarean section    . Wisdon teeth     Family History  Problem Relation Age of Onset  . Mental illness Mother   . Heart disease Father   . Diabetes Sister   . Diabetes Brother   . Heart disease Paternal Grandfather    History  Substance Use Topics  . Smoking status: Former Games developer  . Smokeless tobacco: Not on file  . Alcohol Use: Yes     Comment: occ   OB History   Grav Para Term Preterm Abortions TAB SAB Ect Mult Living                 Review of Systems  Musculoskeletal: Positive for arthralgias.  All other systems reviewed and are negative.    Allergies  Penicillins  Home Medications   Current Outpatient Rx  Name  Route  Sig  Dispense  Refill  . ibuprofen (ADVIL,MOTRIN) 200 MG tablet   Oral   Take 800 mg by mouth every 6 (six) hours as needed for pain.         Marland Kitchen MAGNESIUM PO   Oral   Take 0.5 tablets by mouth daily.         . Multiple Vitamins-Minerals (DAILY WOMENS HEALTH FORMULA) TABS   Oral   Take by mouth every morning.         . polyethylene glycol powder (MIRALAX) powder   Oral   Take 17 g by mouth daily as  needed (constipation).          . terbinafine (LAMISIL) 250 MG tablet   Oral   Take 250 mg by mouth daily.          BP 110/73  Pulse 50  Temp(Src) 98.5 F (36.9 C) (Oral)  Resp 18  Wt 115 lb (52.164 kg)  BMI 18.16 kg/m2  SpO2 99% Physical Exam  Nursing note and vitals reviewed. Constitutional: She appears well-developed and well-nourished. No distress.  HENT:  Head: Normocephalic and atraumatic.  Eyes: Conjunctivae are normal.  Neck: Normal range of motion.  Cardiovascular: Normal rate and regular rhythm.  Exam reveals no gallop and no friction rub.   No murmur heard. Pulmonary/Chest: Effort normal and breath sounds normal. She has no wheezes. She has no rales. She exhibits no tenderness.  Musculoskeletal: Normal range of motion.  Left lateral foot pain near distal 5th metatarsal bone. No wound or obvious deformity.   Neurological: She is alert.  Speech is goal-oriented. Moves limbs without ataxia.   Skin: Skin is warm and dry.  Psychiatric: She has a normal mood and affect. Her behavior is  normal.    ED Course  Procedures (including critical care time) Labs Review Labs Reviewed - No data to display Imaging Review Dg Foot Complete Left  11/19/2012   *RADIOLOGY REPORT*  Clinical Data: Pain post trauma  LEFT FOOT - COMPLETE 3+ VIEW  Comparison: None.  Findings: Frontal, oblique, and lateral views were obtained.  No fracture or dislocation.  Joint spaces appear intact.  No erosive change.  IMPRESSION: No abnormality noted.   Original Report Authenticated By: Bretta Bang, M.D.    MDM   1. Left foot pain     12:13 PM Xray of left foot pending. No neurovascular compromise.   1:16 PM Xray unremarkable for acute changes. Patient will have Naprosyn for pain. Patient instructed to rest, ice and elevate foot. Patient instructed to follow up with PCP for further evaluation as needed. Vitals stable and patient afebrile.     Emilia Beck, PA-C 11/19/12 1319

## 2012-11-19 NOTE — ED Notes (Signed)
Pt c/o left foot pain after taking trash out today; pt sts thought may have been ant bite but still painful

## 2012-11-20 NOTE — ED Provider Notes (Signed)
Medical screening examination/treatment/procedure(s) were performed by non-physician practitioner and as supervising physician I was immediately available for consultation/collaboration.    Christopher J. Pollina, MD 11/20/12 1345 

## 2012-11-20 NOTE — Telephone Encounter (Signed)
Spoke with pt and informed her of the message below.  

## 2012-12-09 ENCOUNTER — Other Ambulatory Visit (HOSPITAL_COMMUNITY): Payer: Self-pay | Admitting: Internal Medicine

## 2012-12-09 DIAGNOSIS — R109 Unspecified abdominal pain: Secondary | ICD-10-CM

## 2012-12-18 ENCOUNTER — Ambulatory Visit (HOSPITAL_COMMUNITY)
Admission: RE | Admit: 2012-12-18 | Discharge: 2012-12-18 | Disposition: A | Discharge status: Home / Self Care | Payer: Medicare Other | Source: Ambulatory Visit | Attending: Internal Medicine | Admitting: Internal Medicine

## 2012-12-18 DIAGNOSIS — R109 Unspecified abdominal pain: Secondary | ICD-10-CM

## 2012-12-18 DIAGNOSIS — R945 Abnormal results of liver function studies: Secondary | ICD-10-CM | POA: Insufficient documentation

## 2012-12-24 ENCOUNTER — Emergency Department (EMERGENCY_DEPARTMENT_HOSPITAL)
Admission: EM | Admit: 2012-12-24 | Discharge: 2012-12-26 | Disposition: A | Discharge status: Other Acute Inpatient Hospital | Payer: Medicare Other | Source: Home / Self Care | Attending: Emergency Medicine | Admitting: Emergency Medicine

## 2012-12-24 ENCOUNTER — Encounter (HOSPITAL_COMMUNITY): Payer: Self-pay | Admitting: Emergency Medicine

## 2012-12-24 DIAGNOSIS — Z3202 Encounter for pregnancy test, result negative: Secondary | ICD-10-CM | POA: Insufficient documentation

## 2012-12-24 DIAGNOSIS — Z87448 Personal history of other diseases of urinary system: Secondary | ICD-10-CM | POA: Insufficient documentation

## 2012-12-24 DIAGNOSIS — S39012A Strain of muscle, fascia and tendon of lower back, initial encounter: Secondary | ICD-10-CM

## 2012-12-24 DIAGNOSIS — Z880 Allergy status to penicillin: Secondary | ICD-10-CM | POA: Insufficient documentation

## 2012-12-24 DIAGNOSIS — R45851 Suicidal ideations: Secondary | ICD-10-CM

## 2012-12-24 DIAGNOSIS — Z87891 Personal history of nicotine dependence: Secondary | ICD-10-CM | POA: Insufficient documentation

## 2012-12-24 DIAGNOSIS — F319 Bipolar disorder, unspecified: Secondary | ICD-10-CM

## 2012-12-24 DIAGNOSIS — F329 Major depressive disorder, single episode, unspecified: Secondary | ICD-10-CM | POA: Diagnosis present

## 2012-12-24 DIAGNOSIS — Z79899 Other long term (current) drug therapy: Secondary | ICD-10-CM | POA: Insufficient documentation

## 2012-12-24 DIAGNOSIS — Z791 Long term (current) use of non-steroidal anti-inflammatories (NSAID): Secondary | ICD-10-CM | POA: Insufficient documentation

## 2012-12-24 DIAGNOSIS — Z8719 Personal history of other diseases of the digestive system: Secondary | ICD-10-CM | POA: Insufficient documentation

## 2012-12-24 HISTORY — DX: Depression, unspecified: F32.A

## 2012-12-24 HISTORY — DX: Major depressive disorder, single episode, unspecified: F32.9

## 2012-12-24 LAB — CBC WITH DIFFERENTIAL/PLATELET
Basophils Absolute: 0.1 10*3/uL (ref 0.0–0.1)
Basophils Relative: 1 % (ref 0–1)
Eosinophils Absolute: 0.1 10*3/uL (ref 0.0–0.7)
Eosinophils Relative: 1 % (ref 0–5)
HCT: 38.4 % (ref 36.0–46.0)
Hemoglobin: 13.2 g/dL (ref 12.0–15.0)
Lymphocytes Relative: 22 % (ref 12–46)
Lymphs Abs: 1.3 10*3/uL (ref 0.7–4.0)
MCH: 31.1 pg (ref 26.0–34.0)
MCHC: 34.4 g/dL (ref 30.0–36.0)
MCV: 90.6 fL (ref 78.0–100.0)
Monocytes Absolute: 0.5 10*3/uL (ref 0.1–1.0)
Monocytes Relative: 9 % (ref 3–12)
Neutro Abs: 4.2 10*3/uL (ref 1.7–7.7)
Neutrophils Relative %: 68 % (ref 43–77)
Platelets: 251 10*3/uL (ref 150–400)
RBC: 4.24 MIL/uL (ref 3.87–5.11)
RDW: 11.9 % (ref 11.5–15.5)
WBC: 6.1 10*3/uL (ref 4.0–10.5)

## 2012-12-24 LAB — BASIC METABOLIC PANEL
BUN: 13 mg/dL (ref 6–23)
CO2: 29 mEq/L (ref 19–32)
Calcium: 10.3 mg/dL (ref 8.4–10.5)
Chloride: 92 mEq/L — ABNORMAL LOW (ref 96–112)
Creatinine, Ser: 0.76 mg/dL (ref 0.50–1.10)
GFR calc Af Amer: >90 mL/min (ref >90)
GFR calc non Af Amer: >90 mL/min (ref >90)
Glucose, Bld: 88 mg/dL (ref 70–99)
Potassium: 2.6 mEq/L — CL (ref 3.5–5.1)
Sodium: 134 mEq/L — ABNORMAL LOW (ref 135–145)

## 2012-12-24 LAB — URINE MICROSCOPIC-ADD ON

## 2012-12-24 LAB — URINALYSIS, ROUTINE W REFLEX MICROSCOPIC
Bilirubin Urine: NEGATIVE
Glucose, UA: NEGATIVE mg/dL
Hgb urine dipstick: NEGATIVE
Ketones, ur: NEGATIVE mg/dL
Nitrite: NEGATIVE
Protein, ur: NEGATIVE mg/dL
Specific Gravity, Urine: 1.018 (ref 1.005–1.030)
Urobilinogen, UA: 0.2 mg/dL (ref 0.0–1.0)
pH: 6.5 (ref 5.0–8.0)

## 2012-12-24 LAB — ETHANOL: Alcohol, Ethyl (B): <11 mg/dL (ref 0–11)

## 2012-12-24 LAB — RAPID URINE DRUG SCREEN, HOSP PERFORMED
Amphetamines: NOT DETECTED
Barbiturates: NOT DETECTED
Benzodiazepines: NOT DETECTED
Cocaine: NOT DETECTED
Opiates: NOT DETECTED
Tetrahydrocannabinol: NOT DETECTED

## 2012-12-24 MED ORDER — ACETAMINOPHEN 325 MG PO TABS: 650.0000 mg | ORAL_TABLET | ORAL | Status: DC | PRN

## 2012-12-24 MED ORDER — NICOTINE 21 MG/24HR TD PT24: 21.0000 mg | MEDICATED_PATCH | Freq: Every day | TRANSDERMAL | Status: DC

## 2012-12-24 MED ORDER — ZOLPIDEM TARTRATE 5 MG PO TABS: 5.0000 mg | ORAL_TABLET | Freq: Every evening | ORAL | Status: DC | PRN

## 2012-12-24 MED ORDER — LORAZEPAM 1 MG PO TABS: 1.0000 mg | ORAL_TABLET | Freq: Three times a day (TID) | ORAL | Status: DC | PRN

## 2012-12-24 MED ORDER — POTASSIUM CHLORIDE CRYS ER 20 MEQ PO TBCR
60.0000 meq | EXTENDED_RELEASE_TABLET | Freq: Once | ORAL | Status: AC
  Administered 2012-12-24: 60 meq via ORAL
  Filled 2012-12-24: qty 3

## 2012-12-24 MED ORDER — ONDANSETRON HCL 4 MG PO TABS
4.0000 mg | ORAL_TABLET | Freq: Three times a day (TID) | ORAL | Status: DC | PRN
Start: 2012-12-24 — End: 2012-12-26

## 2012-12-24 MED ORDER — ALUM & MAG HYDROXIDE-SIMETH 200-200-20 MG/5ML PO SUSP
30.0000 mL | ORAL | Status: DC | PRN
  Administered 2012-12-24: 30 mL via ORAL
  Filled 2012-12-24: qty 30

## 2012-12-24 MED ORDER — IBUPROFEN 200 MG PO TABS: 600.0000 mg | ORAL_TABLET | Freq: Three times a day (TID) | ORAL | Status: DC | PRN

## 2012-12-24 NOTE — ED Notes (Signed)
Patient has one bag of belongings in locker 26. 

## 2012-12-24 NOTE — ED Notes (Signed)
Made Primary RN nurse aware of critical lab value and new verbal orders given.

## 2012-12-24 NOTE — ED Notes (Signed)
Critical lab value Potassium 2.6. Made Freida Busman EDP aware. New Verbal order given: 60 meq Potassium PO. RBAV
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
## 2012-12-25 ENCOUNTER — Encounter (HOSPITAL_COMMUNITY): Payer: Self-pay | Admitting: *Deleted

## 2012-12-25 DIAGNOSIS — R45851 Suicidal ideations: Secondary | ICD-10-CM

## 2012-12-25 DIAGNOSIS — S39012A Strain of muscle, fascia and tendon of lower back, initial encounter: Secondary | ICD-10-CM

## 2012-12-25 DIAGNOSIS — F329 Major depressive disorder, single episode, unspecified: Secondary | ICD-10-CM | POA: Diagnosis present

## 2012-12-25 DIAGNOSIS — F332 Major depressive disorder, recurrent severe without psychotic features: Secondary | ICD-10-CM

## 2012-12-25 HISTORY — DX: Major depressive disorder, single episode, unspecified: F32.9

## 2012-12-25 LAB — POTASSIUM: Potassium: 4.1 mEq/L (ref 3.5–5.1)

## 2012-12-25 LAB — POCT PREGNANCY, URINE: Preg Test, Ur: NEGATIVE

## 2012-12-25 MED ORDER — IBUPROFEN 800 MG PO TABS
800.0000 mg | ORAL_TABLET | Freq: Once | ORAL | Status: AC
  Administered 2012-12-25: 800 mg via ORAL
  Filled 2012-12-25: qty 1

## 2012-12-25 MED ORDER — LAMOTRIGINE 25 MG PO TABS
25.0000 mg | ORAL_TABLET | Freq: Every day | ORAL | Status: DC
  Administered 2012-12-25: 25 mg via ORAL
  Filled 2012-12-25 (×2): qty 1

## 2012-12-25 MED ORDER — DOCUSATE SODIUM 100 MG PO CAPS
100.0000 mg | ORAL_CAPSULE | Freq: Two times a day (BID) | ORAL | Status: DC
  Administered 2012-12-25: 100 mg via ORAL
  Filled 2012-12-25: qty 1
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
## 2012-12-25 NOTE — ED Notes (Signed)
Awaiting arrival of transport to take to [facility].

## 2012-12-25 NOTE — ED Notes (Signed)
Called to give report nurse unavailable will call back.  

## 2012-12-25 NOTE — ED Notes (Signed)
Psychiatry and ACT at bedside.

## 2012-12-25 NOTE — ED Provider Notes (Signed)
9:07 PM Pt Accepted to [facility] under Dr. Rainey Pines. I evaluated her for her back pain. I suspect she has a back strain from picking up a child several days ago. No imaging needed. Will recommend scheduled NSAIDS such as ibuprofen.   Clinical Impression 1. Suicidal ideation   2. Bipolar disorder, unspecified   3. Lumbar strain, initial encounter      Junius Argyle, MD 12/26/12 [phone]

## 2012-12-25 NOTE — Progress Notes (Addendum)
Pt accepted to [facility] pending 500 hall bed.  Pt referred to [place], [facility], [place], [facility], [place], [place] pending review.  No beds at [place], [address], [place], [place], [place].   Oncoming TTS/CSW to refer to [facility] waitlist.   .Catha Gosselin, LCSW [phone]  ED CSW .12/25/2012 1411pm   Pt declined from [place] due to past history of eating disorder, which is exclusionary per [facility].

## 2012-12-25 NOTE — ED Notes (Signed)
Patient bp was 87/58 pulse 55. Dr Patria Mane made aware. Will cont to monitor no orders given at this time . Encourage plenty of fluids PO

## 2012-12-25 NOTE — ED Notes (Signed)
Security and pellum in unit to transport pt to [facility].

## 2012-12-25 NOTE — ED Notes (Addendum)
Made dr Patria Mane aware of patient am BP this AM. No orders given

## 2012-12-26 ENCOUNTER — Inpatient Hospital Stay (HOSPITAL_COMMUNITY)
Admission: AD | Admit: 2012-12-26 | Discharge: 2013-01-01 | DRG: 885 | Disposition: A | Discharge status: Home / Self Care | Payer: Medicare Other | Source: Intra-hospital | Attending: Psychiatry | Admitting: Psychiatry

## 2012-12-26 ENCOUNTER — Encounter (HOSPITAL_COMMUNITY): Payer: Self-pay | Admitting: *Deleted

## 2012-12-26 DIAGNOSIS — R45851 Suicidal ideations: Secondary | ICD-10-CM

## 2012-12-26 DIAGNOSIS — Z79899 Other long term (current) drug therapy: Secondary | ICD-10-CM

## 2012-12-26 DIAGNOSIS — F5000 Anorexia nervosa, unspecified: Secondary | ICD-10-CM

## 2012-12-26 DIAGNOSIS — S39012A Strain of muscle, fascia and tendon of lower back, initial encounter: Secondary | ICD-10-CM

## 2012-12-26 DIAGNOSIS — K644 Residual hemorrhoidal skin tags: Secondary | ICD-10-CM

## 2012-12-26 DIAGNOSIS — F332 Major depressive disorder, recurrent severe without psychotic features: Principal | ICD-10-CM | POA: Diagnosis present

## 2012-12-26 DIAGNOSIS — F319 Bipolar disorder, unspecified: Secondary | ICD-10-CM

## 2012-12-26 DIAGNOSIS — F4310 Post-traumatic stress disorder, unspecified: Secondary | ICD-10-CM | POA: Diagnosis present

## 2012-12-26 DIAGNOSIS — M62838 Other muscle spasm: Secondary | ICD-10-CM

## 2012-12-26 DIAGNOSIS — F329 Major depressive disorder, single episode, unspecified: Secondary | ICD-10-CM

## 2012-12-26 DIAGNOSIS — F259 Schizoaffective disorder, unspecified: Secondary | ICD-10-CM

## 2012-12-26 HISTORY — DX: Anxiety disorder, unspecified: F41.9

## 2012-12-26 MED ORDER — MAGNESIUM HYDROXIDE 400 MG/5ML PO SUSP
30.0000 mL | Freq: Every day | ORAL | Status: DC | PRN
  Administered 2012-12-27 – 2013-01-01 (×6): 30 mL via ORAL

## 2012-12-26 MED ORDER — NICOTINE 21 MG/24HR TD PT24
21.0000 mg | MEDICATED_PATCH | Freq: Every day | TRANSDERMAL | Status: DC
  Filled 2012-12-26 (×9): qty 1

## 2012-12-26 MED ORDER — ACETAMINOPHEN 325 MG PO TABS: 650.0000 mg | ORAL_TABLET | ORAL | Status: DC | PRN

## 2012-12-26 MED ORDER — LAMOTRIGINE 25 MG PO TABS
25.0000 mg | ORAL_TABLET | Freq: Every day | ORAL | Status: DC
  Administered 2012-12-26 – 2012-12-30 (×5): 25 mg via ORAL
  Filled 2012-12-26 (×7): qty 1

## 2012-12-26 MED ORDER — LORAZEPAM 1 MG PO TABS
1.0000 mg | ORAL_TABLET | Freq: Three times a day (TID) | ORAL | Status: DC | PRN
  Administered 2012-12-28: 1 mg via ORAL
  Filled 2012-12-26: qty 1

## 2012-12-26 MED ORDER — ALUM & MAG HYDROXIDE-SIMETH 200-200-20 MG/5ML PO SUSP
30.0000 mL | ORAL | Status: DC | PRN
  Administered 2012-12-30: 30 mL via ORAL

## 2012-12-26 MED ORDER — DOCUSATE SODIUM 100 MG PO CAPS
100.0000 mg | ORAL_CAPSULE | Freq: Every day | ORAL | Status: DC
  Administered 2012-12-26 – 2013-01-01 (×7): 100 mg via ORAL
  Filled 2012-12-26 (×10): qty 1

## 2012-12-26 MED ORDER — ACETAMINOPHEN 325 MG PO TABS: 650.0000 mg | ORAL_TABLET | Freq: Four times a day (QID) | ORAL | Status: DC | PRN

## 2012-12-26 NOTE — Progress Notes (Signed)
Adult Psychoeducational Group Note  Date:  12/26/2012 Time:  11:00am Group Topic/Focus:  Building Self Esteem:   The Focus of this group is helping patients become aware of the effects of self-esteem on their lives, the things they and others do that enhance or undermine their self-esteem, seeing the relationship between their level of self-esteem and the choices they make and learning ways to enhance self-esteem.  Participation Level:  Active  Participation Quality:  Appropriate and Attentive  Affect:  Appropriate  Cognitive:  Alert and Appropriate  Insight: Appropriate  Engagement in Group:  Engaged  Modes of Intervention:  Discussion and Education  Additional Comments:  Pt attended and participated in group. Discussion was about lifestyle changes. When ask what she would change pt stated self-control.   Shelly Bombard D 12/26/2012, 2:46 PM

## 2012-12-26 NOTE — Progress Notes (Signed)
D: Patient appropriate and cooperative with staff. Patient's affect/mood is depressed. She reported on the self inventory sheet that her sleep is poor, appetite and ability to pay attention are good and energy level is low. Patient rated depression and feelings of hopelessness "5". She's participating in groups and tolerating medications well.  A: Support and encouragement provided to patient. Scheduled medications administered per MD orders. Maintain Q15 minute checks for safety.  R: Patient receptive. Passive SI, but contracts for safety. Denies HI. Patient remains safe.

## 2012-12-26 NOTE — [facility] Group Notes (Signed)
The focus of this group is to educate the patient on the purpose and policies of crisis stabilization and provide a format to answer questions about their admission.  The group details unit policies and expectations of patients while admitted.  Pt attended and was quiet and attentive during group.
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
## 2012-12-26 NOTE — [facility] Group Notes (Signed)
[facility] LCSW Group Therapy  12/26/2012  1:15 PM   Type of Therapy:  Group Therapy  Participation Level:  Active  Participation Quality:  Appropriate and Attentive  Affect:  Appropriate, Flat and Depressed.    Cognitive:  Alert and Appropriate  Insight:  Developing/Improving and Engaged  Engagement in Therapy:  Developing/Improving and Engaged  Modes of Intervention:  Activity, Clarification, Confrontation, Discussion, Education, Exploration, Limit-setting, Orientation, Problem-solving, Rapport Building, Reality Testing, Socialization and Support  Summary of Progress/Problems: Patient was attentive and engaged with speaker from [employer].  Patient was attentive to speaker while they shared their story of dealing with mental health and overcoming it.  Patient expressed interest in their programs and services and received information on their agency.  Patient processed ways they can relate to the speaker.     Amandeep Hogston Horton, LCSWA 12/26/2012 1:32 PM   
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
## 2012-12-26 NOTE — Tx Team (Signed)
Initial Interdisciplinary Treatment Plan  PATIENT STRENGTHS: (choose at least two) Motivation for treatment/growth Religious Affiliation Supportive family/friends  PATIENT STRESSORS: Financial difficulties Legal issue Living arrangements   PROBLEM LIST: Problem List/Patient Goals Date to be addressed Date deferred Reason deferred Estimated date of resolution  depression 12/26/2012     anxiety 12/26/2012     hopelessness 12/26/2012                                          DISCHARGE CRITERIA:  Ability to meet basic life and health needs Adequate post-discharge living arrangements Improved stabilization in mood, thinking, and/or behavior Verbal commitment to aftercare and medication compliance  PRELIMINARY DISCHARGE PLAN: Attend aftercare/continuing care group Outpatient therapy Placement in alternative living arrangements  PATIENT/FAMIILY INVOLVEMENT: This treatment plan has been presented to and reviewed with the patient, Cassandra Cunningham, and/or family member.  The patient and family have been given the opportunity to ask questions and make suggestions.  Carrolyn Leigh W 12/26/2012, 2:39 AM

## 2012-12-26 NOTE — Progress Notes (Signed)
Patient ID: Cassandra Cunningham, female   DOB: Apr 07, 1970, 42 y.o.   MRN: [id number] PER STATE REGULATIONS 482.30  THIS CHART WAS REVIEWED FOR MEDICAL NECESSITY WITH RESPECT TO THE PATIENT'S ADMISSION/ DURATION OF STAY.  NEXT REVIEW DATE: 12/29/2012  Willa Rough, RN, BSN CASE MANAGER

## 2012-12-26 NOTE — Progress Notes (Signed)
Recreation Therapy Notes  Date: 10.09.2014 Time: 2:45pm Location: [facility]  Group Topic: Software engineer Activities (AAA)  Behavioral Response: Did not attend   [employer], LRT/CTRS  Jearl Klinefelter 12/26/2012 4:43 PM

## 2012-12-27 DIAGNOSIS — F332 Major depressive disorder, recurrent severe without psychotic features: Principal | ICD-10-CM

## 2012-12-27 DIAGNOSIS — F3130 Bipolar disorder, current episode depressed, mild or moderate severity, unspecified: Secondary | ICD-10-CM

## 2012-12-27 DIAGNOSIS — F4310 Post-traumatic stress disorder, unspecified: Secondary | ICD-10-CM

## 2012-12-27 MED ORDER — ADULT MULTIVITAMIN W/MINERALS CH
1.0000 | ORAL_TABLET | Freq: Every day | ORAL | Status: DC
  Administered 2012-12-27 – 2013-01-01 (×6): 1 via ORAL
  Filled 2012-12-27 (×9): qty 1

## 2012-12-27 MED ORDER — CALCIUM POLYCARBOPHIL 625 MG PO TABS
1250.0000 mg | ORAL_TABLET | Freq: Every day | ORAL | Status: DC
  Administered 2012-12-28 – 2012-12-31 (×4): 1250 mg via ORAL
  Filled 2012-12-27 (×7): qty 2

## 2012-12-27 NOTE — Progress Notes (Signed)
Nutrition Brief Note  Patient identified on the Malnutrition Screening Tool (MST) Report.  Wt Readings from Last 10 Encounters:  12/26/12 110 lb (49.896 kg)  11/19/12 115 lb (52.164 kg)  11/07/12 109 lb 9.6 oz (49.714 kg)  09/24/12 109 lb 6.4 oz (49.624 kg)  07/23/12 108 lb 12.8 oz (49.351 kg)  05/07/12 111 lb 3.2 oz (50.44 kg)  09/13/11 113 lb 12.8 oz (51.619 kg)   Body mass index is 17.76 kg/(m^2). Patient meets criteria for underweight based on current BMI.   Discussed intake PTA with patient and compared to intake presently. Pt admitted with depression and suicidal ideations. Has history of eating disorder. Pt thin and underweight. Met with pt who reports not having a BM since Tuesday PTA and requests fiber supplement - states she prefers magnesium citrate but will take Miralax and states she needs some with every meal. Reports having internal hemorrhoids. States she was getting too much sleep at home but still feeling exhausted. Denies being on a regular meal schedule at home, reports her food intake was "varied" PTA. For activity, pt walks 1 hour daily. Pt reports that her eating disorder was in the past, got tearful in talking about it, says as long as her life and meals are structured, this has not come back. Noted pt recently in eating disorder program at [facility] this year. Pt smiling when talking about her sister who told her recently "I've noticed that you are very organized and that being on a schedule has been beneficial for you". Pt states she has been called a "neat freak" in the past but is aware that organization and structure are much needed in her life. She says when she feels like when her life lacks structure, then she has depression, anxiety, and fear of abandonment. Pt reports eating well during admission. Pt's potassium low on admission, now normal. Pt requests multivitamin.   Current diet order is regular and pt is also offered choice of unit snacks mid-morning and  mid-afternoon.  Pt is eating as desired.   Labs and medications reviewed.   Nutrition Dx:  Altered GI function related to constipation as evidenced by pt report.   Interventions:   Discussed the importance of nutrition and encouraged intake of food and beverages. Pt aware of high fiber foods.   Encouraged pt to continue to have structured meal schedule at home to prevent relapse into eating disorder. If pt shows any signs of restriction during admission, recommend outpatient RD follow up. [facility] has RDs that can help counsel pt with eating disorder (phone number [phone]).     Multivitamin 1 tablet PO daily    Due to pt's admitting low potassium, history of eating disorder, and lack of specific details regarding her eating habits PTA, recommend MD monitor pt's phosphorus, magnesium, and potassium as pt may be at risk for refeeding syndrome   No additional nutrition interventions warranted at this time. If nutrition issues arise, please consult RD.   Levon Hedger MS, RD, LDN [phone] Pager [phone] After Hours Pager

## 2012-12-27 NOTE — Progress Notes (Signed)
Writer observed patient sitting in the dayroom earlier watching tv and interacting with peers. Patient attended group and participated. She reports having had a good day and has learned a lot since being here. Patient reports that her sister visited on today. Patient also reports that her depression seems to be worst in the am hours and as the day goes on her depression lessens. Support and encouragement offered, safety maintained on unit with 15 min checks, will continue to monitor.

## 2012-12-27 NOTE — Progress Notes (Addendum)
D: Pt +ve passive SI, but contracts for safety.  Pt endorses AVH, but pt states it is much better than when she came in. Pt denies HI.   A: Pt was offered support and encouragement.   Q 15 minute checks were done for safety.  R:. Pt has no complaints at this time.Pt receptive to treatment and safety maintained on unit. Pt slept for the majority of the evening.

## 2012-12-27 NOTE — Progress Notes (Signed)
D: Patient's affect/mood is depressed, but brighter today as opposed to prior shift. She reported on the self inventory sheet that her sleep is fair, appetite and ability to pay attention are both good and energy level is normal. Patient rated depression "3" and feelings of hopelessness "0". She's going to groups and compliant with medication regimen.  A: Support and encouragement provided to patient. Scheduled medications administered per MD orders. Maintain Q15 minute checks for safety.  R: Patient receptive. Passive SI, but contracts for safety. Denies HI and AVH. Patient remains safe.
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
## 2012-12-27 NOTE — Progress Notes (Signed)
[facility] MD Progress Note  12/27/2012 5:41 PM Cassandra Cunningham  MRN:  [id number] Subjective:  Patient notes that she feels much better today and she knows she is better due to her improved decrease in SI, they are not as intense and not as frequent. She also denies HI and notes that her depression is way down and states that it is currently at a 0/10. Her anxiety is also down from a 10 to a 2/10. Patient also requesting an increase in Fiber in her medications. 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  Kidney infection    .  Hemorrhoids      bleeding   .  Depression    .  Anxiety     AXIS IV: economic problems, educational problems, housing problems, occupational problems, problems related to legal system/crime and problems with access to health care services  AXIS V: 41-50 serious symptoms      ADL's:  Intact  Sleep: Good  Appetite:  Good  Suicidal Ideation:  Yes but denies intent, plan, or means. Notes that her thoughts of SI are decreasing in intensity and frequency. Homicidal Ideation:  denies AEB (as evidenced by):  Psychiatric Specialty Exam: Review of Systems  Constitutional: Negative.  Negative for fever, chills, weight loss, malaise/fatigue and diaphoresis.  HENT: Negative for congestion and sore throat.   Eyes: Negative for blurred vision, double vision and photophobia.  Respiratory: Negative for cough, shortness of breath and wheezing.   Cardiovascular: Negative for chest pain, palpitations and PND.  Gastrointestinal:  Negative for heartburn, nausea, vomiting, abdominal pain, diarrhea and constipation.  Musculoskeletal: Negative for falls, joint pain and myalgias.  Neurological: Negative for dizziness, tingling, tremors, sensory change, speech change, focal weakness, seizures, loss of consciousness, weakness and headaches.  Endo/Heme/Allergies: Negative for polydipsia. Does not bruise/bleed easily.  Psychiatric/Behavioral: Negative for depression, suicidal ideas, hallucinations, memory loss and substance abuse. The patient is not nervous/anxious and does not have insomnia.     Blood pressure 99/66, pulse 80, temperature 97.4 F (36.3 C), temperature source Oral, resp. rate 16, height 5\' 6"  (1.676 m), weight 49.896 kg (110 lb), last menstrual period 11/22/2012.Body mass index is 17.76 kg/(m^2).  General Appearance: Casual  Eye Contact::  Fair  Speech:  Clear and Coherent  Volume:  Normal  Mood:  Anxious  Affect:  Congruent  Thought Process:  Goal Directed  Orientation:  Full (Time, Place, and Person)  Thought Content:  WDL  Suicidal Thoughts:  Yes.  without intent/plan  Homicidal Thoughts:  No  Memory:  Immediate;   Fair  Judgement:  Impaired  Insight:  Lacking  Psychomotor Activity:  Normal  Concentration:  Fair  Recall:  Fair  Akathisia:  No  Handed:  Right  AIMS (if indicated):     Assets:  Communication Skills Desire for Improvement  Sleep:  Number of Hours: 6.75   Current Medications: Current Facility-Administered Medications  Medication Dose Route Frequency Provider Last Rate Last Dose  . acetaminophen (TYLENOL) tablet 650 mg  650 mg Oral Q4H PRN Shuvon Rankin, NP      . acetaminophen (TYLENOL) tablet 650 mg  650  mg Oral Q6H PRN Shuvon Rankin, NP      . alum & mag hydroxide-simeth (MAALOX/MYLANTA) 200-200-20 MG/5ML suspension 30 mL  30 mL Oral Q4H PRN Shuvon Rankin, NP      . docusate sodium (COLACE) capsule 100 mg  100 mg Oral Daily Nehemiah Settle, MD   100 mg at 12/27/12 0751   . lamoTRIgine (LAMICTAL) tablet 25 mg  25 mg Oral Daily Shuvon Rankin, NP   25 mg at 12/27/12 0750  . LORazepam (ATIVAN) tablet 1 mg  1 mg Oral Q8H PRN Shuvon Rankin, NP      . magnesium hydroxide (MILK OF MAGNESIA) suspension 30 mL  30 mL Oral Daily PRN Shuvon Rankin, NP   30 mL at 12/27/12 1532  . multivitamin with minerals tablet 1 tablet  1 tablet Oral Daily Lavena Bullion, RD   1 tablet at 12/27/12 1532  . nicotine (NICODERM CQ - dosed in mg/24 hours) patch 21 mg  21 mg Transdermal Daily Shuvon Rankin, NP      . [START ON 12/28/2012] polycarbophil (FIBERCON) tablet 1,250 mg  1,250 mg Oral Daily Verne Spurr, PA-C        Lab Results: No results found for this or any previous visit (from the past 48 hour(s)).  Physical Findings: AIMS: Facial and Oral Movements Muscles of Facial Expression: None, normal Lips and Perioral Area: None, normal Jaw: None, normal Tongue: None, normal,Extremity Movements Upper (arms, wrists, hands, fingers): None, normal Lower (legs, knees, ankles, toes): None, normal, Trunk Movements Neck, shoulders, hips: None, normal, Overall Severity Severity of abnormal movements (highest score from questions above): None, normal Incapacitation due to abnormal movements: None, normal Patient's awareness of abnormal movements (rate only patient's report): No Awareness, Dental Status Current problems with teeth and/or dentures?: No Does patient usually wear dentures?: No  CIWA:    COWS:     Treatment Plan Summary: Daily contact with patient to assess and evaluate symptoms and progress in treatment Medication management  Plan: 1. Continue crisis management and stabilization. 2. Medication management to reduce current symptoms to base line and improve patient's overall level of functioning 3. Treat health problems as indicated. 4. Develop treatment plan to decrease risk of relapse upon discharge and the need for     readmission. 5. Psycho-social education  regarding relapse prevention and self care. 6. Health care follow up as needed for medical problems. 7. Continue home medications where appropriate. 8. Discharge plan in process with expected d/c date on Sunday.  Medical Decision Making Problem Points:  Established problem, stable/improving (1) Data Points:  Review or order medicine tests (1)  I certify that inpatient services furnished can reasonably be expected to improve the patient's condition.  Rona Ravens. [place] RPAC 10:37 PM 12/27/2012  Reviewed the information documented and agree with the treatment plan.  Tashauna Caisse,JANARDHAHA R. 12/27/2012 11:41 PM

## 2012-12-27 NOTE — [facility] Group Notes (Signed)
[facility] LCSW Group Therapy  Feelings Around Relapse 1:15 -2:30 [facility] LCSW Group Therapy  Feelings Around Relapse 1:15 -2:30        12/27/2012  3:40 PM   Type of Therapy:  Group Therapy  Participation Level:  Appropriate  Participation Quality:  Appropriate  Affect:  Appropriate  Cognitive:  Attentive Appropriate  Insight:  Developing/Improving  Engagement in Therapy: Developing/Improving  Modes of Intervention:  Discussion Exploration Problem-Solving Supportive  Summary of Progress/Problems:  The topic for today was feelings around relapse.    Patient processed feelings toward relapse and was able to relate to peers. She stated relapse for her would be returning to self-hatred.  Patient identified coping skills that can be used to prevent a relapse.   Wynn Banker 12/27/2012 3:40 PM

## 2012-12-28 LAB — TSH: TSH: 1.689 u[IU]/mL (ref 0.350–4.500)

## 2012-12-28 NOTE — Progress Notes (Signed)
Patient ID: Cassandra Cunningham, female   DOB: September 17, 1970, 42 y.o.   MRN: [id number] [facility] MD Progress Note  12/28/2012 6:53 PM Cassandra Cunningham  MRN:  [id number] Subjective:  Patient notes that she feels much better now. Better sleep last night. Attending groups today.   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  Kidney infection    .  Hemorrhoids      bleeding   .  Depression    .  Anxiety     AXIS IV: economic problems, educational problems, housing problems, occupational problems, problems related to legal system/crime and problems with access to health care services  AXIS V: 41-50 serious symptoms      ADL's:  Intact  Sleep: Good  Appetite:  Good  Suicidal Ideation:  Yes but denies intent, plan, or means. Notes that her thoughts of SI are decreasing in intensity and frequency. Homicidal Ideation:  denies AEB (as evidenced by):  Psychiatric Specialty Exam: Review of Systems  Constitutional: Negative.  Negative for fever, chills, weight loss, malaise/fatigue and diaphoresis.  HENT: Negative for congestion and sore throat.   Eyes: Negative for blurred vision, double vision and photophobia.  Respiratory: Negative for cough, shortness of breath and wheezing.   Cardiovascular: Negative for chest pain, palpitations and PND.  Gastrointestinal: Negative for heartburn, nausea, vomiting, abdominal pain, diarrhea and constipation.  Musculoskeletal: Negative for falls, joint pain and myalgias.  Neurological: Negative for dizziness, tingling,  tremors, sensory change, speech change, focal weakness, seizures, loss of consciousness, weakness and headaches.  Endo/Heme/Allergies: Negative for polydipsia. Does not bruise/bleed easily.  Psychiatric/Behavioral: Negative for depression, suicidal ideas, hallucinations, memory loss and substance abuse. The patient is not nervous/anxious and does not have insomnia.     Blood pressure 83/56, pulse 74, temperature 97.7 F (36.5 C), temperature source Oral, resp. rate 16, height 5\' 6"  (1.676 m), weight 49.896 kg (110 lb), last menstrual period 11/22/2012.Body mass index is 17.76 kg/(m^2).  General Appearance: Casual  Eye Contact::  Fair  Speech:  Clear and Coherent  Volume:  Normal  Mood:  Anxious  Affect:  Congruent  Thought Process:  Goal Directed  Orientation:  Full (Time, Place, and Person)  Thought Content:  WDL  Suicidal Thoughts:  denies  Homicidal Thoughts:  No  Memory:  Immediate;   Fair  Judgement:  Impaired  Insight:  Lacking  Psychomotor Activity:  Normal  Concentration:  Fair  Recall:  Fair  Akathisia:  No  Handed:  Right  AIMS (if indicated):     Assets:  Communication Skills Desire for Improvement  Sleep:  Number of Hours: 5.5   Current Medications: Current Facility-Administered Medications  Medication Dose Route Frequency Provider Last Rate Last Dose  . acetaminophen (TYLENOL) tablet 650 mg  650 mg Oral Q4H PRN Shuvon Rankin, NP      . acetaminophen (TYLENOL) tablet 650 mg  650 mg Oral Q6H PRN Shuvon Rankin, NP      . alum & mag hydroxide-simeth (MAALOX/MYLANTA) 200-200-20 MG/5ML suspension 30 mL  30 mL Oral Q4H PRN Shuvon Rankin, NP      . docusate sodium (COLACE) capsule  100 mg  100 mg Oral Daily Nehemiah Settle, MD   100 mg at 12/27/12 0751  . lamoTRIgine (LAMICTAL) tablet 25 mg  25 mg Oral Daily Shuvon Rankin, NP   25 mg at 12/27/12 0750  . LORazepam (ATIVAN) tablet 1 mg  1 mg Oral Q8H PRN Shuvon Rankin, NP      . magnesium hydroxide (MILK OF  MAGNESIA) suspension 30 mL  30 mL Oral Daily PRN Shuvon Rankin, NP   30 mL at 12/27/12 1532  . multivitamin with minerals tablet 1 tablet  1 tablet Oral Daily Lavena Bullion, RD   1 tablet at 12/27/12 1532  . nicotine (NICODERM CQ - dosed in mg/24 hours) patch 21 mg  21 mg Transdermal Daily Shuvon Rankin, NP      . Melene Muller ON 12/28/2012] polycarbophil (FIBERCON) tablet 1,250 mg  1,250 mg Oral Daily Verne Spurr, PA-C        Lab Results:  Results for orders placed during the hospital encounter of 12/26/12 (from the past 48 hour(s))  TSH     Status: None   Collection Time    12/27/12  7:54 PM      Result Value Range   TSH 1.689  0.350 - 4.500 uIU/mL   Comment: Performed at [facility]    Physical Findings: AIMS: Facial and Oral Movements Muscles of Facial Expression: None, normal Lips and Perioral Area: None, normal Jaw: None, normal Tongue: None, normal,Extremity Movements Upper (arms, wrists, hands, fingers): None, normal Lower (legs, knees, ankles, toes): None, normal, Trunk Movements Neck, shoulders, hips: None, normal, Overall Severity Severity of abnormal movements (highest score from questions above): None, normal Incapacitation due to abnormal movements: None, normal Patient's awareness of abnormal movements (rate only patient's report): No Awareness, Dental Status Current problems with teeth and/or dentures?: No Does patient usually wear dentures?: No  CIWA:    COWS:     Treatment Plan Summary: Daily contact with patient to assess and evaluate symptoms and progress in treatment Medication management  Plan: 1. Continue crisis management and stabilization. 2. Medication management to reduce current symptoms to base line and improve patient's overall level of functioning 3. Treat health problems as indicated. 4. Develop treatment plan to decrease risk of relapse upon discharge and the need for     readmission. 5. Psycho-social education regarding relapse  prevention and self care. 6. Health care follow up as needed for medical problems. 7. Continue home medications where appropriate. 8. Discharge plan in process  Medical Decision Making Problem Points:  Established problem, stable/improving (1) Data Points:  Review or order medicine tests (1)  I certify that inpatient services furnished can reasonably be expected to improve the patient's condition.    Wonda Cerise 12/28/2012 6:53 PM

## 2012-12-28 NOTE — Progress Notes (Signed)
[facility] Group Notes:  (Nursing/MHT/Case Management/Adjunct)  Date:  12/27/2012 Time:  2000  Type of Therapy:  Psychoeducational Skills  Participation Level:  Active  Participation Quality:  Appropriate  Affect:  Appropriate  Cognitive:  Appropriate  Insight:  Good  Engagement in Group:  Engaged  Modes of Intervention:  Education  Summary of Progress/Problems: The patient verbalized in group that she had a good day. She states that she learned a great deal of information from the groups. She did not have a goal for tomorrow.   Rhyatt Muska S 12/28/2012, 12:08 AM

## 2012-12-28 NOTE — Progress Notes (Signed)
Patient ID: VERNICE MANNINA, female   DOB: Jun 02, 1970, 42 y.o.   MRN: [id number] D: Pt is awake and active on the unit this AM. Pt endorses passive SI but she is able to contract for safety. Pt rates their depression at 0 and hopelessness at 0. Pt's mood is depressed and her affect is flat/sad. Pt writes that she plans to "keep a schedule and stay on schedule." Pt is participating in the milieu, attending groups and is cooperative with staff. Pt is vested in treatment and trying to learn as much as possible during her stay.   A: Encouraged pt to discuss feelings with staff and administered medication per MD orders. Writer also encouraged pt to participate in groups.  R: Pt is attending groups and tolerating medications well. Writer will continue to monitor. 15 minute checks are ongoing for safety.

## 2012-12-28 NOTE — Progress Notes (Signed)
Psychoeducational Group Note  Date:  12/28/12 Time: 1015  Group Topic/Focus:  Identifying Needs:   The focus of this group is to help patients identify their personal needs that have been historically problematic and identify healthy behaviors to address their needs.  Participation Level:  active Participation Quality: good Affect: flat Cognitive:    Insight:  good  Engagement in Group: engaged  Additional Comments:    PDuke RN [employer]

## 2012-12-28 NOTE — [facility] Group Notes (Signed)
[facility] Group Notes:  (Clinical Social Work)  12/28/2012   3:00-4:00PM  Summary of Progress/Problems:   The main focus of today's process group was for the patient to identify ways in which they have sabotaged their own mental health wellness/recovery.  Motivational interviewing was used to explore the reasons they engage in this behavior, and reasons they may have for wanting to change.  The Stages of Change were explained to the group using a handout, and patients identified where they are with regard to changing self-defeating behaviors.  The patient expressed that one of her self-sabotaging behaviors is committing to things that she cannot do, then running away from acknowledging the truth to the people she has committed to.  She is in Contemplation Stage, but also states she is happy at being in Action stage with another self-sabotaging behavior.    Type of Therapy:  Process Group  Participation Level:  Active  Participation Quality:  Attentive and Sharing  Affect:  Blunted and Depressed  Cognitive:  Appropriate  Insight:  Engaged  Engagement in Therapy:  Engaged  Modes of Intervention:  Education, Motivational Interviewing   Ambrose Mantle, LCSW 12/28/2012, 4:14 PM

## 2012-12-29 NOTE — Clinical Social Work Note (Signed)
CSW note  CSW was informed by RN that discharge scheduled for today was cancelled due to patient's SI, and that patient had concerns about a court appearance scheduled for tomorrow.  When CSW approached the patient, she became angry, got up close in CSW's face, and was yelling about missing court date tomorrow.  When CSW told her we could  send a letter to the court explaining she is in the hospital, she became angrier, saying it would prevent her from moving to [place].  Insisted she wanted to sign a 72-hr release, and this info was passed to RN.  Ambrose Mantle, LCSW 12/29/2012, 2:42 PM

## 2012-12-29 NOTE — Progress Notes (Signed)
Psychoeducational Group Note  Date: 12/29/2012 Time: 0930 Group Topic/Focus:  Gratefulness:  The focus of this group is to help patients identify what three things they are most grateful for in their lives. What helps ground them and to center them on their work to their recovery.  Participation Level:  Active  Participation Quality:  Appropriate  Affect:  Appropriate  Cognitive:  Alert  Insight:  Improving  Engagement in Group:  Engaged  Additional Comments:  Pt stated she was grateful for her sister, brother Jesus and angels  Dione Housekeeper

## 2012-12-29 NOTE — Progress Notes (Signed)
Nutrition Follow up  Patient complaints of bleeding with BM secondary to internal Hemorids.  Has been trying to increase fiber intake.  States that she is eating a lot of fruits and vegetables.  Requested others.    Concern for refeeding by previous RD.  No new labs.    Patient with hx of Anorexia Nervosa with recent stay for Eating Disorder in [facility].  Patient reports that she is unhappy with her current body image but does not know if she would be happy with a lower or higher weight.    Encouraged patient with healthy nutrition, tips for constipation and importance of not decreasing weight further.  Patient verbalized understanding and states that she is eating well.  Oran Rein, RD, LDN

## 2012-12-29 NOTE — Progress Notes (Signed)
Date: 12/29/2012  Time: 1015  Group Topic/Focus:  Making Healthy Choices: The focus of this group is to help patients identify negative/unhealthy choices they were using prior to admission and identify positive/healthier coping strategies to replace them upon discharge.  Participation Level: Active  Participation Quality: Appropriate  Affect: Appropriate  Cognitive: Oriented  Insight: Improving  Engagement in Group: Improving  Additional Comments: was engaged in the discussion and partisipated  Aragorn Recker A  12/29/2012  

## 2012-12-29 NOTE — [facility] Group Notes (Signed)
[facility] Group Notes:  (Clinical Social Work)  12/29/2012   3:00-4:00PM  Summary of Progress/Problems:   The main focus of today's process group was to   identify the patient's current support system and decide on other supports that can be put in place.  The picture on workbook was used to discuss why additional supports are needed, and a hand-out was distributed with four definitions/levels of support, then used to talk about how patients have given and received all different kinds of support.  An emphasis was placed on using counselor, doctor, therapy groups, 12-step groups, and problem-specific support groups to expand supports.  The patient expressed full comprehension of the concepts presented, and agreed that there is a need to add more supports.  She stated that she thought her mother and daughter would be good supports, but after hearing about co-dependency and enabling, she wonders.  She will continue to think about this.  She also apologized for her anger earlier directed at CSW.  Type of Therapy:  Process Group  Participation Level:  Minimal  Participation Quality:  Attentive  Affect:  Depressed and Flat  Cognitive:  Oriented  Insight:  Developing/Improving  Engagement in Therapy:  Engaged  Modes of Intervention:  Education,  Support and [employer], LCSW 12/29/2012, 4:16 PM

## 2012-12-29 NOTE — Progress Notes (Signed)
Patient attended group this evening and participated. She reports that her support is the Lord and she is not too certain of her living arrangements after discharge. Patient is hopeful to discharge on tomorrow b/c she has court tomorrow. Writer encouraged her to speak with her social worker on tomorrow concerning thisd Personal assistant will pass information off to day shift nurse coming on in the am. Support offered and safety maintained. Patient has requested no medications. Will continue to monitor.

## 2012-12-29 NOTE — Progress Notes (Signed)
Patient has been up and active on the unit this evening and has voiced no complaints. Patient currently denies having pain, -si/hi/a/v hall. Support and encouragement offered, safety maintained on unit, will continue to monitor.

## 2012-12-29 NOTE — Progress Notes (Signed)
[facility] Group Notes:  (Nursing/MHT/Case Management/Adjunct)  Date:  12/28/2012 Time:  2000  Type of Therapy:  Psychoeducational Skills  Participation Level:  Active  Participation Quality:  Appropriate  Affect:  Appropriate  Cognitive:  Appropriate  Insight:  Appropriate  Engagement in Group:  Engaged  Modes of Intervention:  Education  Summary of Progress/Problems: The patient verbalized in group that she had a "smooth" day. She mentioned that she realized that she doesn't like the anxiety medication that she takes due to the way it makes her feel. Her coping mechanisms include keeping a schedule and adding some things to her daytime schedule. Her goal for tomorrow is to develop a "prevention plan" and to work on her discharge plans.   Shevonne Wolf S 12/29/2012, 1:41 AM

## 2012-12-29 NOTE — Progress Notes (Signed)
Patient ID: Cassandra Cunningham, female   DOB: Oct 19, 1970, 42 y.o.   MRN: [id number] [facility] MD Progress Note  12/29/2012 6:04 PM Cassandra Cunningham  MRN:  [id number] Subjective:  She has SI thoughts at times today but no plans. Think she may have leg swelling from Lamictal but not able to give details now. Crying now and wants to leave to go to court tomorrow. Feels very depressed now. Argues more today. She refused any med changes today.   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  Kidney infection    .  Hemorrhoids      bleeding   .  Depression    .  Anxiety     AXIS IV: economic problems, educational problems, housing problems, occupational problems, problems related to legal system/crime and problems with access to health care services  AXIS V: 41-50 serious symptoms      ADL's:  Intact  Sleep: Good  Appetite:  Good  Suicidal Ideation:  Yes but denies intent, plan, or means. Notes that her thoughts of SI are decreasing in intensity and frequency. Homicidal Ideation:  denies AEB (as evidenced by):  Psychiatric Specialty Exam: Review of Systems  Constitutional: Negative.  Negative for fever, chills, weight loss, malaise/fatigue and diaphoresis.  HENT: Negative for congestion and sore throat.   Eyes: Negative for blurred vision, double vision and photophobia.  Respiratory: Negative for cough, shortness of breath and wheezing.   Cardiovascular: Negative for chest pain, palpitations and PND.  Gastrointestinal: Negative for heartburn, nausea,  vomiting, abdominal pain, diarrhea and constipation.  Musculoskeletal: Negative for falls, joint pain and myalgias.  Neurological: Negative for dizziness, tingling, tremors, sensory change, speech change, focal weakness, seizures, loss of consciousness, weakness and headaches.  Endo/Heme/Allergies: Negative for polydipsia. Does not bruise/bleed easily.  Psychiatric/Behavioral: Negative for depression, suicidal ideas, hallucinations, memory loss and substance abuse. The patient is not nervous/anxious and does not have insomnia.     Blood pressure 89/59, pulse 71, temperature 97.8 F (36.6 C), temperature source Oral, resp. rate 16, height 5\' 6"  (1.676 m), weight 49.896 kg (110 lb), last menstrual period 11/22/2012.Body mass index is 17.76 kg/(m^2).  General Appearance: Casual  Eye Contact::  Fair  Speech:  Clear and Coherent  Volume:  Normal  Mood:  Anxious  Affect:  Congruent  Thought Process:  Goal Directed  Orientation:  Full (Time, Place, and Person)  Thought Content:  WDL  Suicidal Thoughts:  Yes but no plans  Homicidal Thoughts:  No  Memory:  Immediate;   Fair  Judgement:  Impaired  Insight:  Lacking  Psychomotor Activity:  Normal  Concentration:  Fair  Recall:  Fair  Akathisia:  No  Handed:  Right  AIMS (if indicated):     Assets:  Communication Skills Desire for Improvement  Sleep:  Number of Hours: 5.5   Current Medications: Current Facility-Administered Medications  Medication Dose Route Frequency Provider Last Rate Last Dose  . acetaminophen (TYLENOL) tablet 650 mg  650 mg Oral Q4H PRN Shuvon Rankin, NP      . acetaminophen (TYLENOL) tablet 650 mg  650 mg Oral Q6H PRN Shuvon  Rankin, NP      . alum & mag hydroxide-simeth (MAALOX/MYLANTA) 200-200-20 MG/5ML suspension 30 mL  30 mL Oral Q4H PRN Shuvon Rankin, NP      . docusate sodium (COLACE) capsule 100 mg  100 mg Oral Daily Nehemiah Settle, MD   100 mg at 12/27/12 0751  . lamoTRIgine (LAMICTAL) tablet 25 mg   25 mg Oral Daily Shuvon Rankin, NP   25 mg at 12/27/12 0750  . LORazepam (ATIVAN) tablet 1 mg  1 mg Oral Q8H PRN Shuvon Rankin, NP      . magnesium hydroxide (MILK OF MAGNESIA) suspension 30 mL  30 mL Oral Daily PRN Shuvon Rankin, NP   30 mL at 12/27/12 1532  . multivitamin with minerals tablet 1 tablet  1 tablet Oral Daily Lavena Bullion, RD   1 tablet at 12/27/12 1532  . nicotine (NICODERM CQ - dosed in mg/24 hours) patch 21 mg  21 mg Transdermal Daily Shuvon Rankin, NP      . Melene Muller ON 12/28/2012] polycarbophil (FIBERCON) tablet 1,250 mg  1,250 mg Oral Daily Verne Spurr, PA-C        Lab Results:  Results for orders placed during the hospital encounter of 12/26/12 (from the past 48 hour(s))  TSH     Status: None   Collection Time    12/27/12  7:54 PM      Result Value Range   TSH 1.689  0.350 - 4.500 uIU/mL   Comment: Performed at [facility]    Physical Findings: AIMS: Facial and Oral Movements Muscles of Facial Expression: None, normal Lips and Perioral Area: None, normal Jaw: None, normal Tongue: None, normal,Extremity Movements Upper (arms, wrists, hands, fingers): None, normal Lower (legs, knees, ankles, toes): None, normal, Trunk Movements Neck, shoulders, hips: None, normal, Overall Severity Severity of abnormal movements (highest score from questions above): None, normal Incapacitation due to abnormal movements: None, normal Patient's awareness of abnormal movements (rate only patient's report): No Awareness, Dental Status Current problems with teeth and/or dentures?: No Does patient usually wear dentures?: No  CIWA:    COWS:     Treatment Plan Summary: Daily contact with patient to assess and evaluate symptoms and progress in treatment Medication management  Plan: 1. Continue crisis management and stabilization. 2. Medication management to reduce current symptoms to base line and improve patient's overall level of functioning 3. Treat health problems  as indicated. 4. Develop treatment plan to decrease risk of relapse upon discharge and the need for     readmission. 5. Psycho-social education regarding relapse prevention and self care. 6. Health care follow up as needed for medical problems. 7. Continue home medications where appropriate. 8. Will consider med changes tomorrow if needed. She refused today  Medical Decision Making Problem Points:  Established problem, stable/improving (1) Data Points:  Review or order medicine tests (1)  I certify that inpatient services furnished can reasonably be expected to improve the patient's condition.    Wonda Cerise 12/29/2012 6:04 PM

## 2012-12-29 NOTE — Progress Notes (Signed)
Patient ID: Cassandra Cunningham, female   DOB: Jul 28, 1970, 42 y.o.   MRN: [id number] D: Pt is awake and active on the unit this AM. Pt endorses passive SI but she is able to contract for safety. Pt rates their depression at 3 and hopelessness at 0. Pt's mood is depressed and her affect is sad/tearful. Pt c/o swelling in her lower extremities, and feels it is relate to Lamictal, and this is a documented side effect. M.D. Will consider a medication change and possibly a longer stay for observation. Pt is somewhat isolative but cooperative with staff. Pt writes she will "keep a schedule and work on Armed forces technical officer."   A: Encouraged pt to discuss feelings with staff and administered medication per MD orders. Writer also encouraged pt to participate in groups.  R: Pt is attending groups and tolerating medications well. Writer will continue to monitor. 15 minute checks are ongoing for safety.

## 2012-12-30 MED ORDER — GABAPENTIN 100 MG PO CAPS
100.0000 mg | ORAL_CAPSULE | Freq: Three times a day (TID) | ORAL | Status: DC
  Administered 2012-12-31 – 2013-01-01 (×5): 100 mg via ORAL
  Filled 2012-12-30 (×11): qty 1

## 2012-12-30 NOTE — [facility] Group Notes (Signed)
[facility] LCSW Group Therapy  12/30/2012  1:15 PM   Type of Therapy:  Group Therapy  Participation Level:  Active  Participation Quality:  Appropriate and Attentive  Affect:  Appropriate, Flat, Depressed  Cognitive:  Alert and Appropriate  Insight:  Developing/Improving and Engaged  Engagement in Therapy:  Developing/Improving and Engaged  Modes of Intervention:  Clarification, Confrontation, Discussion, Education, Exploration, Limit-setting, Orientation, Problem-solving, Rapport Building, Dance movement psychotherapist, Socialization and Support  Summary of Progress/Problems: Pt identified obstacles faced currently and processed barriers involved in overcoming these obstacles. Pt identified steps necessary for overcoming these obstacles and explored motivation (internal and external) for facing these difficulties head on. Pt further identified one area of concern in their lives and chose a goal to focus on for today. Pt came towards the end of group and sat quietly, listening to the group discussion.    Reyes Ivan, LCSWA 12/30/2012 3:09 PM

## 2012-12-30 NOTE — Tx Team (Signed)
Interdisciplinary Treatment Plan Update (Adult)  Date: 12/30/2012  Time Reviewed:  9:45 AM  Progress in Treatment: Attending groups: Yes Participating in groups:  Yes Taking medication as prescribed:  Yes Tolerating medication:  Yes Family/Significant othe contact made: CSW assessing  Patient understands diagnosis:  Yes Discussing patient identified problems/goals with staff:  Yes Medical problems stabilized or resolved:  Yes Denies suicidal/homicidal ideation: Yes Issues/concerns per patient self-inventory:  Yes Other:  New problem(s) identified: N/A  Discharge Plan or Barriers: Pt has follow up scheduled at [facility] [place] for medication management and therapy.    Reason for Continuation of Hospitalization: Anxiety Depression Medication Stabilization  Comments: N/A  Estimated length of stay: 2-3 days  For review of initial/current patient goals, please see plan of care.  Attendees: Patient:     Family:     Physician:  Dr. Javier Glazier 12/30/2012 12:17 PM   Nursing:   Quintella Reichert, RN 12/30/2012 12:17 PM   Clinical Social Worker:  Reyes Ivan, LCSWA 12/30/2012 12:17 PM   Other: Verne Spurr, PA 12/30/2012 12:17 PM   Other:  Frankey Shown, MA care coordination 12/30/2012 12:17 PM   Other:  Neill Loft, RN 12/30/2012 12:17 PM   Other:     Other:    Other:    Other:    Other:    Other:    Other:     Scribe for Treatment Team:   Carmina Miller, 12/30/2012 12:17 PM

## 2012-12-30 NOTE — [facility] Group Notes (Signed)
[facility] LCSW Aftercare Discharge Planning Group Note   12/30/2012 8:45 AM  Participation Quality:  Alert and Appropriate   Mood/Affect:  Appropriate and Anxious  Depression Rating: 4  Anxiety Rating:  10  Thoughts of Suicide:  Pt denies SI/HI  Will you contract for safety?   Yes  Current AVH:  Pt denies  Plan for Discharge/Comments:  Pt attended discharge planning group and actively participated in group.  CSW provided pt with today's workbook.  Pt reports going home with her sister.  Pt is concerned about her court date today; CSW will send a letter today.  Pt will follow up with [facility] [place] for medication management and therapy.  No further needs voiced by pt at this time.    Transportation Means: Pt reports access to transportation - pt will need bus pass  Supports: No supports mentioned at this time  Reyes Ivan, LCSWA 12/30/2012 10:26 AM

## 2012-12-30 NOTE — Progress Notes (Signed)
Pt attended spiritual care group on grief and loss facilitated by chaplain Burnis Kingfisher.  Group opened with brief discussion and psycho-social ed around grief and loss in relationships and in relation to self - identifying life patterns, circumstances, changes that cause losses. Established group norm of speaking from own life experience.  Group goal of establishing open and affirming space for members to share loss and experience with grief, normalize grief experience and provide psycho social education and grief support. Group members discussed guilt related to decisions made in life - expressing grief around the ways they have hurt others, changes in relationships, and difficulty in forgiving self and accepting forgiveness from others.  Also discussed loss of family members without saying goodbye, focusing on theme of regret around things left unsaid.  Group discussed ways different family members   Cassandra Cunningham arrived late to group.  She was attentive to discussions.  She did not contribute to group discussion.   Belva Crome MDiv

## 2012-12-30 NOTE — Progress Notes (Signed)
Adult Psychoeducational Group Note  Date:  12/30/2012 Time:  11:00am Group Topic/Focus:  Self Care:   The focus of this group is to help patients understand the importance of self-care in order to improve or restore emotional, physical, spiritual, interpersonal, and financial health.  Participation Level:  Active  Participation Quality:  Appropriate and Attentive  Affect:  Appropriate  Cognitive:  Alert and Appropriate  Insight: Appropriate  Engagement in Group:  Engaged  Modes of Intervention:  Discussion and Education  Additional Comments:  Pt attended and participated in group. Discussion today was on self care. When ask What am I like when I am feeling well and What do I need to do less often to keep my overall wellbeing  Pt stated I am happy and able to accomplish my goals,and loving life. Pt also stated she would be less isolated more organized and let people be themselves.  Shelly Bombard D 12/30/2012, 1:24 PM

## 2012-12-30 NOTE — [facility] Suicide Risk Assessment (Signed)
[facility] INPATIENT:  Family/Significant Other Suicide Prevention Education  Suicide Prevention Education:  Education Completed; Cassandra Cunningham, sister, [phone]  has been identified by the patient as the family member/significant other with whom the patient will be residing, and identified as the person(s) who will aid the patient in the event of a mental health crisis (suicidal ideations/suicide attempt).  With written consent from the patient, the family member/significant other has been provided the following suicide prevention education, prior to the and/or following the discharge of the patient.  The suicide prevention education provided includes the following:  Suicide risk factors  Suicide prevention and interventions  National Suicide Hotline telephone number  [facility] assessment telephone number  [facility] Emergency Assistance 911  [facility] and/or Residential Mobile Crisis Unit telephone number  Request made of family/significant other to:  Remove weapons (e.g., guns, rifles, knives), all items previously/currently identified as safety concern.    Remove drugs/medications (over-the-counter, prescriptions, illicit drugs), all items previously/currently identified as a safety concern.  The family member/significant other verbalizes understanding of the suicide prevention education information provided.  The family member/significant other agrees to remove the items of safety concern listed above.  Cassandra Cunningham states there are no guns at their house, but does not know about guns at boyfriend's house.  Daryel Gerald B 12/30/2012, 5:56 PM

## 2012-12-30 NOTE — Progress Notes (Addendum)
D:  Patient's self inventory sheet, patient has fair sleep, good appetite, normal energy level, good attention span.  Rated depression 4, hopeless 2.  Denied withdrawals.  SI, contracts for safety.  Has experienced pain in past 24 hours, swollen ft/ankles.  Worst pain #2.  After discharge, plans to keep a schedule.  Anxiety goes up and thank you for being here.  Does have discharge plans.  Does have problems taking meds after discharge.  Does not want to take lamictal if that is causing swelling in feet.   A:  Medications administered per MD orders.  Emotional support and encouragement given patient. R:  Denied HI.  Denied A/V hallucinations.  Stated the Shaune Pollack speaks to her.  Denied pain.  SI, contracts for safety.

## 2012-12-30 NOTE — Progress Notes (Signed)
[facility] Group Notes:  (Nursing/MHT/Case Management/Adjunct)  Date:  12/29/2012 Time:  2000  Type of Therapy:  Psychoeducational Skills  Participation Level:  Active  Participation Quality:  Appropriate  Affect:  Appropriate  Cognitive:  Appropriate  Insight:  Good  Engagement in Group:  Engaged  Modes of Intervention:  Education  Summary of Progress/Problems: The patient expressed in group this evening that she had an "ok" day. She eluded to the fact that she wasn't discharged today ("survived") and was obviously disappointed, yet, she utilized her coping skills to deal with the moment. She was proud of the way that she coped with the situation and used what she called "emotional thinking". Her goal for tomorrow is to find out about having her court date re-scheduled. In terms of the theme of the day, she verbalized that her support system is made up of her belief in God.  Daelon Dunivan S 12/30/2012, 12:52 AM

## 2012-12-30 NOTE — Progress Notes (Signed)
[facility] MD Progress Note  12/30/2012 10:01 PM Cassandra Cunningham  MRN:  [id number]  Subjective: Met with Cassandra Cunningham today who notes that she feels a little more down today, but can't really say why. She Does not care for the Lamictal due to the lower extremity edema. She would like to be on something else.  Also notes that she feels that she is being judged by the other patients for her comments in group, but doesn't feel that she can control what she says very well. 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  Kidney infection    .  Hemorrhoids      bleeding   .  Depression    .  Anxiety     AXIS IV: economic problems, educational problems, housing problems, occupational problems, problems related to legal system/crime and problems with access to health care services  AXIS V: 41-50 serious symptoms   ADL's:  Intact  Sleep: Good  Appetite:  Good  Suicidal Ideation:  Yes but denies intent, plan, or means. Notes that her thoughts of SI are decreasing in intensity and frequency. Homicidal Ideation:  denies AEB (as evidenced by):  Psychiatric Specialty Exam: Review of Systems  Constitutional: Negative.  Negative for fever, chills, weight loss, malaise/fatigue and diaphoresis.  HENT: Negative for congestion and sore throat.   Eyes: Negative for blurred vision, double vision and photophobia.  Respiratory: Negative for cough, shortness of breath and wheezing.   Cardiovascular: Negative for chest pain, palpitations and PND.  Gastrointestinal: Negative  for heartburn, nausea, vomiting, abdominal pain, diarrhea and constipation.  Musculoskeletal: Negative for falls, joint pain and myalgias.  Neurological: Negative for dizziness, tingling, tremors, sensory change, speech change, focal weakness, seizures, loss of consciousness, weakness and headaches.  Endo/Heme/Allergies: Negative for polydipsia. Does not bruise/bleed easily.  Psychiatric/Behavioral: Negative for depression, suicidal ideas, hallucinations, memory loss and substance abuse. The patient is not nervous/anxious and does not have insomnia.     Blood pressure 118/76, pulse 50, temperature 97.8 F (36.6 C), temperature source Oral, resp. rate 18, height 5\' 6"  (1.676 m), weight 49.896 kg (110 lb), last menstrual period 11/22/2012.Body mass index is 17.76 kg/(m^2).  General Appearance: Casual  Eye Contact::  Fair  Speech:  Clear and Coherent  Volume:  Normal  Mood:  Anxious  Affect:  Congruent  Thought Process:  Goal Directed  Orientation:  Full (Time, Place, and Person)  Thought Content:  WDL  Suicidal Thoughts:  Yes but no plans  Homicidal Thoughts:  No  Memory:  Immediate;   Fair  Judgement:  Impaired  Insight:  Lacking  Psychomotor Activity:  Normal  Concentration:  Fair  Recall:  Fair  Akathisia:  No  Handed:  Right  AIMS (if indicated):     Assets:  Communication Skills Desire for Improvement  Sleep:  Number of Hours: 3.5   Current Medications: Current Facility-Administered Medications  Medication Dose Route Frequency Provider Last Rate Last Dose  . acetaminophen (TYLENOL) tablet 650 mg  650 mg Oral Q4H PRN Shuvon Rankin, NP      . acetaminophen (TYLENOL) tablet 650 mg  650 mg Oral Q6H  PRN Shuvon Rankin, NP      . alum & mag hydroxide-simeth (MAALOX/MYLANTA) 200-200-20 MG/5ML suspension 30 mL  30 mL Oral Q4H PRN Shuvon Rankin, NP      . docusate sodium (COLACE) capsule 100 mg  100 mg Oral Daily Nehemiah Settle, MD   100 mg at 12/27/12 0751  . lamoTRIgine  (LAMICTAL) tablet 25 mg  25 mg Oral Daily Shuvon Rankin, NP   25 mg at 12/27/12 0750  . LORazepam (ATIVAN) tablet 1 mg  1 mg Oral Q8H PRN Shuvon Rankin, NP      . magnesium hydroxide (MILK OF MAGNESIA) suspension 30 mL  30 mL Oral Daily PRN Shuvon Rankin, NP   30 mL at 12/27/12 1532  . multivitamin with minerals tablet 1 tablet  1 tablet Oral Daily Lavena Bullion, RD   1 tablet at 12/27/12 1532  . nicotine (NICODERM CQ - dosed in mg/24 hours) patch 21 mg  21 mg Transdermal Daily Shuvon Rankin, NP      . [START ON 12/28/2012] polycarbophil (FIBERCON) tablet 1,250 mg  1,250 mg Oral Daily Verne Spurr, PA-C        Lab Results:  No results found for this or any previous visit (from the past 48 hour(s)).  Physical Findings: AIMS: Facial and Oral Movements Muscles of Facial Expression: None, normal Lips and Perioral Area: None, normal Jaw: None, normal Tongue: None, normal,Extremity Movements Upper (arms, wrists, hands, fingers): None, normal Lower (legs, knees, ankles, toes): None, normal, Trunk Movements Neck, shoulders, hips: None, normal, Overall Severity Severity of abnormal movements (highest score from questions above): None, normal Incapacitation due to abnormal movements: None, normal Patient's awareness of abnormal movements (rate only patient's report): No Awareness, Dental Status Current problems with teeth and/or dentures?: No Does patient usually wear dentures?: No  CIWA:  CIWA-Ar Total: 2 COWS:  COWS Total Score: 1  Treatment Plan Summary: Daily contact with patient to assess and evaluate symptoms and progress in treatment Medication management  Plan: 1. Continue crisis management and stabilization. 2. Medication management to reduce current symptoms to base line and improve patient's overall level of functioning 3. Treat health problems as indicated. 4. Develop treatment plan to decrease risk of relapse upon discharge and the need for     readmission. 5. Psycho-social  education regarding relapse prevention and self care. 6. Health care follow up as needed for medical problems. 7. Continue home medications where appropriate. 8. Will d/c Lamictal. 9.  Will start another Neurontin 100mg  po TID mood stabilizer in the AM.  Medical Decision Making Problem Points:  Established problem, stable/improving (1) Data Points:  Review or order medicine tests (1)  I certify that inpatient services furnished can reasonably be expected to improve the patient's condition.   Rona Ravens. Mashburn RPAC 10:42 PM 12/30/2012  Reviewed the information documented and agree with the treatment plan.  Narelle Schoening,JANARDHAHA R. 12/31/2012 12:28 PM

## 2012-12-30 NOTE — Progress Notes (Signed)
Adult Psychoeducational Group Note  Date:  12/30/2012 Time:  10:35 PM  Group Topic/Focus:  Wrap-Up Group:   The focus of this group is to help patients review their daily goal of treatment and discuss progress on daily workbooks.  Participation Level:  Active  Participation Quality:  Appropriate  Affect:  Appropriate  Cognitive:  Appropriate  Insight: Appropriate  Engagement in Group:  Engaged  Modes of Intervention:  Support  Additional Comments:  Pt stated that she was able to get her son's number and that she was able to have the courage to call and leave a voicemail. She is hoping that he will call back and that they can renew their relationship.  She desires to reconnect with her son  Cassandra Cunningham 12/30/2012, 10:35 PM

## 2012-12-30 NOTE — Progress Notes (Signed)
Patient ID: Cassandra Cunningham, female   DOB: [birth year], 42 y.o.   MRN: [id number] PER STATE REGULATIONS 482.30  THIS CHART WAS REVIEWED FOR MEDICAL NECESSITY WITH RESPECT TO THE PATIENT'S ADMISSION/ DURATION OF STAY.  NEXT REVIEW DATE: 01/02/2013  Willa Rough, RN, BSN CASE MANAGER

## 2012-12-31 NOTE — Progress Notes (Signed)
The focus of this group is to educate the patient on the purpose and policies of crisis stabilization and provide a format to answer questions about their admission.  The group details unit policies and expectations of patients while admitted.  Patient attended 0900 nurse education orientation group this morning.  Patient actively participated, appropriate affect, alert, appropriate insight and engagement.  Today patient will work on 3 goals for discharge.  

## 2012-12-31 NOTE — Progress Notes (Signed)
Adult Psychoeducational Group Note  Date:  12/31/2012 Time:  9:45 PM  Group Topic/Focus:  Wrap-Up Group:   The focus of this group is to help patients review their daily goal of treatment and discuss progress on daily workbooks.  Participation Level:  Active  Participation Quality:  Appropriate  Affect:  Appropriate  Cognitive:  Alert and Oriented  Insight: Appropriate  Engagement in Group:  Defensive and Developing/Improving  Modes of Intervention:  Clarification, Exploration and Support  Additional Comments:  Patient stated that one positive is that she was able to get housing options. Patient stated that she was able to learn more coping skills. Patient stated that her short term goal is to get some restful sleep.  Terin Dierolf, Randal Buba 12/31/2012, 9:45 PM

## 2012-12-31 NOTE — Progress Notes (Addendum)
Pt had small BM last night, MOM given pt.  D:  Patient's self inventory sheet, patient has poor sleep, good appetite, normal energy level, good attention span.  Rated depression and anxiety #4, denied hopelessness.  Denied withdrawals.  SI, contracts for safety.  Had felt like vomiting earlier but feeling passed.  Has felt lightheaded, pain, headache in past 24 hours.  Worst pain #2.  After discharge, plans to keep a schedule.  "Thank you for doing your job and being here!  Feels her body does not agree with side effects."  Plans to go home or women's shelter/crisis center.  Needs financial assistance with medications after discharge. A:  Medications administered per MD orders.  Emotional support and encouragement given patient. R:  Denied HI.  SI, contracts for safety.  Voices continue as always. Denied visual hallucinations.  Will continue to monitor patient for safety with 15 minute checks.

## 2012-12-31 NOTE — [facility] Group Notes (Signed)
[facility] LCSW Group Therapy  12/31/2012  1:15 PM   Type of Therapy:  Group Therapy  Participation Level:  Active  Participation Quality:  Appropriate and Attentive  Affect:  Appropriate, Flat and Depressed  Cognitive:  Alert and Appropriate  Insight:  Developing/Improving and Engaged  Engagement in Therapy:  Developing/Improving and Engaged  Modes of Intervention:  Clarification, Confrontation, Discussion, Education, Exploration, Limit-setting, Orientation, Problem-solving, Rapport Building, Dance movement psychotherapist, Socialization and Support  Summary of Progress/Problems: The topic for group therapy was feelings about diagnosis.  Pt actively participated in group discussion on their past and current diagnosis and how they feel towards this.  Pt also identified how society and family members judge them, based on their diagnosis as well as stereotypes and stigmas.   Pt states that she is happy there is help out there for her diagnosis and now has hope she can get better.  Pt shared that she hates the label of mental illness but is also ready to face it and work on her recovery.  Pt actively participated and was engaged in group discussion.    Reyes Ivan, LCSWA 12/31/2012 2:46 PM
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
## 2012-12-31 NOTE — Progress Notes (Signed)
Recreation Therapy Notes   Date: 10.14.2014 Time: 2:45pm Location: [address] Dayroom  Group Topic: Software engineer Activities (AAA)  Behavioral Response: Engaged, Attentive  Affect: Euthymic  Clinical Observations/Feedback: Dog Team: Education officer, museum. Patient interacted appropriately with peer, dog team, LRT and MHT.   Marykay Lex Magally Vahle, LRT/CTRS  Duke Weisensel L 12/31/2012 4:11 PM

## 2012-12-31 NOTE — Progress Notes (Signed)
Recreation Therapy Notes  Date: 10.13.2014 Time: 3:00pm Location: [address] Dayroom  Group Topic: Communication, Team Building, Problem Solving  Goal Area(s) Addresses:  Patient will effectively work with peer towards shared goal.  Patient will identify skill used to make activity successful.  Patient will identify how skills used during activity can be used to reach post d/c goals.   Behavioral Response: Disengaged   Intervention: Problem Solving Activitiy  Activity: Life Boat. Patients were given a scenario about being on a sinking yacht. Patients were informed the yacht included 15 guest, 8 of which could be placed on the life boat, along with all group members. Individuals on guest list were of varying socioeconomic classes such as a Education officer, museum, Materials engineer, Midwife, Tree surgeon.   Education: Customer service manager, Discharge Planning   Education Outcome: Needs additional education  Clinical Observations/Feedback: Patient presented with flat, withdrawn affect. Patient attending group session, but did not engaged in activity, often covering her face with her hands and staring at the floor.   Marykay Lex Loa Idler, LRT/CTRS  Meosha Castanon L 12/31/2012 9:18 AM

## 2012-12-31 NOTE — Progress Notes (Signed)
Adult Psychoeducational Group Note  Date:  12/31/2012 Time: 11:00am Group Topic/Focus:  Recovery Goals:   The focus of this group is to identify appropriate goals for recovery and establish a plan to achieve them.  Participation Level:  Did Not Attend  Participation Quality:    Affect:    Cognitive:    Insight:  Engagement in Group:    Modes of Intervention:    Additional Comments:  Pt did not attend group  Shelly Bombard D 12/31/2012, 1:46 PM
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
## 2013-01-01 MED ORDER — GABAPENTIN 100 MG PO CAPS: 100.0000 mg | ORAL_CAPSULE | Freq: Three times a day (TID) | ORAL | Status: DC
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
## 2013-01-01 NOTE — Tx Team (Signed)
Interdisciplinary Treatment Plan Update   Date Reviewed:  01/01/2013  Time Reviewed:  9:54 AM  Progress in Treatment:   Attending groups: Yes Participating in groups: Yes Taking medication as prescribed: Yes, contact made with sister. Tolerating medication: Yes Family/Significant other contact made: Yes  Patient understands diagnosis: Yes  Discussing patient identified problems/goals with staff: Yes Medical problems stabilized or resolved: Yes Denies suicidal/homicidal ideation: Yes Patient has not harmed self or others: Yes  For review of initial/current patient goals, please see plan of care.  Estimated Length of Stay:  Discharge today  Reasons for Continued Hospitalization:   Patient advised she will return to the home she was living in prior to admission.  Patient given information on [facility] emergency services and safety planning.  Discharge Plan or Barriers:   Home with outpatient follow to be scheduled with [facility]  Additional Comments: N/A   Attendees:  Patient:  Cassandra Cunningham 01/01/2013 9:54 AM   Signature: Mervyn Gay, MD 01/01/2013 9:54 AM  Signature:  Onnie Boer, UR-UM 01/01/2013 9:54 AM  Signature:  Nestor Ramp, RN 01/01/2013 9:54 AM  Signature:  Leighton Parody, RN 01/01/2013 9:54 AM  Signature:  Tomasita Morrow, Care Coordinator  01/01/2013 9:54 AM  Signature:  Juline Patch, LCSW 01/01/2013 9:54 AM  Signature:  Reyes Ivan, LCSW 01/01/2013 9:54 AM  Signature:  Sharin Grave Coordinator 01/01/2013 9:54 AM  Signature:  Onnie Boer, RN CM-UR 01/01/2013 9:54 AM  Signature:  01/01/2013  9:54 AM  Signature:    01/01/2013  9:54 AM  Signature:  01/01/2013  9:54 AM    Scribe for Treatment Team:   Juline Patch,  01/01/2013 9:54 AM

## 2013-01-01 NOTE — Progress Notes (Signed)
Patient ID: Cassandra Cunningham, female   DOB: 02-20-1971, 42 y.o.   MRN: [id number]  D: Pt was laying in bed during the assessment. After introduction pt asked if writer thought it would be possible to "be released tomorrow". Stated her meds were changed today and that she "didn't know if she is going to be able to cope much longer". Pt stated people are from different backgrounds and she doesn't know how much more she can take.  Writer encouraged pt to speak to staff for any assistance, needed.   A:  Support and encouragement was offered. 15 min checks continued for safety.  R: Pt remains safe.

## 2013-01-01 NOTE — [facility] Group Notes (Signed)
[facility] LCSW Aftercare Discharge Planning Group Note   01/01/2013 9:50 AM    Participation Quality:  Appropraite  Mood/Affect:  Appropriate  Depression Rating:  3-4  Anxiety Rating:  3-4  Thoughts of Suicide:  No  Will you contract for safety?   NA  Current AVH:  No  Plan for Discharge/Comments:  Patient attending discharge planning group and actively participated in group. She reports doing okay and ready to discharge home today.  She will follow up with [facility].  Provided all participants with daily workbook.  Transportation Means: Patient has transportation.   Supports:  Patient has a support system.   Fruma Africa, Joesph July

## 2013-01-01 NOTE — Progress Notes (Signed)
Pt discharged per MD orders; pt currently denies SI/HI and auditory/visual hallucinations; pt was given education by RN regarding follow-up appointments and medications and pt denied any questions or concerns about these instructions; pt was then escorted to search room to retrieve her belongings by RN before being discharged to hospital lobby. 
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
## 2013-01-06 NOTE — Progress Notes (Signed)
Patient Discharge Instructions:  After Visit Summary (AVS):   Faxed to:  01/06/13 Psychiatric Admission Assessment Note:   Faxed to:  01/06/13 Suicide Risk Assessment - Discharge Assessment:   Faxed to:  01/06/13 Faxed/Sent to the Next Level Care provider:  01/06/13 Faxed to [facility] of the [facility] @ [phone]  Jerelene Redden, 01/06/2013, 3:27 PM
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
## 2013-01-23 ENCOUNTER — Other Ambulatory Visit: Payer: Self-pay

## 2013-05-25 ENCOUNTER — Encounter (HOSPITAL_COMMUNITY): Payer: Self-pay | Admitting: Emergency Medicine

## 2013-05-25 ENCOUNTER — Emergency Department (HOSPITAL_COMMUNITY)
Admission: EM | Admit: 2013-05-25 | Discharge: 2013-05-25 | Disposition: A | Discharge status: Home / Self Care | Payer: Medicare Other | Attending: Emergency Medicine | Admitting: Emergency Medicine

## 2013-05-25 ENCOUNTER — Emergency Department (HOSPITAL_COMMUNITY): Payer: Medicare Other

## 2013-05-25 DIAGNOSIS — Z8679 Personal history of other diseases of the circulatory system: Secondary | ICD-10-CM | POA: Insufficient documentation

## 2013-05-25 DIAGNOSIS — R109 Unspecified abdominal pain: Secondary | ICD-10-CM | POA: Insufficient documentation

## 2013-05-25 DIAGNOSIS — R110 Nausea: Secondary | ICD-10-CM | POA: Insufficient documentation

## 2013-05-25 DIAGNOSIS — Z87440 Personal history of urinary (tract) infections: Secondary | ICD-10-CM | POA: Insufficient documentation

## 2013-05-25 DIAGNOSIS — Z8659 Personal history of other mental and behavioral disorders: Secondary | ICD-10-CM | POA: Insufficient documentation

## 2013-05-25 DIAGNOSIS — Z3202 Encounter for pregnancy test, result negative: Secondary | ICD-10-CM | POA: Insufficient documentation

## 2013-05-25 LAB — URINALYSIS, ROUTINE W REFLEX MICROSCOPIC
Bilirubin Urine: NEGATIVE
Glucose, UA: NEGATIVE mg/dL
Hgb urine dipstick: NEGATIVE
Ketones, ur: NEGATIVE mg/dL
Leukocytes, UA: NEGATIVE
Nitrite: NEGATIVE
Protein, ur: NEGATIVE mg/dL
Specific Gravity, Urine: 1.004 — ABNORMAL LOW (ref 1.005–1.030)
Urobilinogen, UA: 0.2 mg/dL (ref 0.0–1.0)
pH: 7.5 (ref 5.0–8.0)

## 2013-05-25 LAB — COMPREHENSIVE METABOLIC PANEL
ALT: 21 U/L (ref 0–35)
AST: 24 U/L (ref 0–37)
Albumin: 3.9 g/dL (ref 3.5–5.2)
Alkaline Phosphatase: 51 U/L (ref 39–117)
BUN: 13 mg/dL (ref 6–23)
CO2: 31 mEq/L (ref 19–32)
Calcium: 9.4 mg/dL (ref 8.4–10.5)
Chloride: 91 mEq/L — ABNORMAL LOW (ref 96–112)
Creatinine, Ser: 0.78 mg/dL (ref 0.50–1.10)
GFR calc Af Amer: >90 mL/min (ref >90)
GFR calc non Af Amer: >90 mL/min (ref >90)
Glucose, Bld: 86 mg/dL (ref 70–99)
Potassium: 3.2 mEq/L — ABNORMAL LOW (ref 3.7–5.3)
Sodium: 135 mEq/L — ABNORMAL LOW (ref 137–147)
Total Bilirubin: 0.2 mg/dL — ABNORMAL LOW (ref 0.3–1.2)
Total Protein: 6.7 g/dL (ref 6.0–8.3)

## 2013-05-25 LAB — CBC
HCT: 31.9 % — ABNORMAL LOW (ref 36.0–46.0)
Hemoglobin: 10.2 g/dL — ABNORMAL LOW (ref 12.0–15.0)
MCH: 27.2 pg (ref 26.0–34.0)
MCHC: 32 g/dL (ref 30.0–36.0)
MCV: 85.1 fL (ref 78.0–100.0)
Platelets: 224 10*3/uL (ref 150–400)
RBC: 3.75 MIL/uL — ABNORMAL LOW (ref 3.87–5.11)
RDW: 13.3 % (ref 11.5–15.5)
WBC: 3.9 10*3/uL — ABNORMAL LOW (ref 4.0–10.5)

## 2013-05-25 LAB — PREGNANCY, URINE: Preg Test, Ur: NEGATIVE

## 2013-05-25 MED ORDER — IOHEXOL 300 MG/ML  SOLN
100.0000 mL | Freq: Once | INTRAMUSCULAR | Status: AC | PRN
  Administered 2013-05-25: 100 mL via INTRAVENOUS

## 2013-05-25 MED ORDER — ONDANSETRON HCL 4 MG/2ML IJ SOLN: 4.0000 mg | Freq: Once | INTRAMUSCULAR | Status: DC

## 2013-05-25 MED ORDER — SODIUM CHLORIDE 0.9 % IV BOLUS (SEPSIS)
1000.0000 mL | Freq: Once | INTRAVENOUS | Status: AC
  Administered 2013-05-25: 1000 mL via INTRAVENOUS

## 2013-05-25 MED ORDER — HYDROCODONE-ACETAMINOPHEN 5-325 MG PO TABS: 1.0000 | ORAL_TABLET | Freq: Four times a day (QID) | ORAL | Status: DC | PRN

## 2013-05-25 MED ORDER — IOHEXOL 300 MG/ML  SOLN
50.0000 mL | Freq: Once | INTRAMUSCULAR | Status: AC | PRN
  Administered 2013-05-25: 50 mL via ORAL

## 2013-05-25 NOTE — ED Provider Notes (Signed)
CSN: [id number]     Arrival date & time 05/25/13  1324 History   First MD Initiated Contact with Patient 05/25/13 1336     Chief Complaint  Patient presents with  . Flank Pain     (Consider location/radiation/quality/duration/timing/severity/associated sxs/prior Treatment) The history is provided by the patient. No language interpreter was used.  Cassandra Cunningham is a 43 y/o F with PMHx of kidney infection, depression, anxiety presenting to the ED with right sided flank pain that started this morning described as a burning sensation that is constant without radiation. Patient reported that she has used nothing for the pain. Stated that she has been feeling nauseous. Stated that she has history of bladder reflux with history of kidney infection. Denied fever, and chills, chest pain, shortness of breath, difficulty breathing, abdominal pain, eating changes, vomiting, diarrhea, melena, hematochezia, history of kidney stones. PCP Dr. Vista Lawman  Past Medical History  Diagnosis Date  . Kidney infection   . Hemorrhoids     bleeding  . Depression   . Anxiety    Past Surgical History  Procedure Laterality Date  . Tubal ligation    . Cesarean section    . Wisdon teeth     Family History  Problem Relation Age of Onset  . Mental illness Mother   . Heart disease Father   . Diabetes Sister   . Diabetes Brother   . Heart disease Paternal Grandfather    History  Substance Use Topics  . Smoking status: Former Smoker    Quit date: 12/19/2007  . Smokeless tobacco: Not on file  . Alcohol Use: Yes     Comment: Hx of alcohol dependence-now reports that she only drinks etoh occasionally   OB History   Grav Para Term Preterm Abortions TAB SAB Ect Mult Living                 Review of Systems  Constitutional: Negative for fever and chills.  HENT: Negative for congestion and trouble swallowing.   Respiratory: Negative for chest tightness and shortness of breath.   Cardiovascular: Negative for  chest pain.  Gastrointestinal: Positive for nausea. Negative for vomiting, abdominal pain, diarrhea, constipation, blood in stool and anal bleeding.  Genitourinary: Positive for hematuria (ppatient currently menstruating) and flank pain (Right-sided). Negative for dysuria, decreased urine volume, vaginal bleeding, vaginal discharge and vaginal pain.  Neurological: Negative for dizziness and weakness.  All other systems reviewed and are negative.      Allergies  Review of patient's allergies indicates no known allergies.  Home Medications  No current outpatient prescriptions on file. BP 122/72  Pulse 45  Temp(Src) 97.6 F (36.4 C) (Oral)  Resp 16  Ht 5' 6.5" (1.689 m)  Wt 107 lb (48.535 kg)  BMI 17.01 kg/m2  SpO2 100%  LMP 05/25/2013 Physical Exam  Nursing note and vitals reviewed. Constitutional: She is oriented to person, place, and time. She appears well-developed and well-nourished. No distress.  HENT:  Head: Normocephalic and atraumatic.  Mouth/Throat: Oropharynx is clear and moist. No oropharyngeal exudate.  Eyes: Conjunctivae and EOM are normal. Pupils are equal, round, and reactive to light. Right eye exhibits no discharge. Left eye exhibits no discharge.  Neck: Normal range of motion. Neck supple. No tracheal deviation present.  Negative neck stiffness Negative nuchal rigidity Negative cervical lymphadenopathy  Cardiovascular: Normal rate, regular rhythm and normal heart sounds.  Exam reveals no friction rub.   No murmur heard. Pulses:      Radial pulses  are 2+ on the right side, and 2+ on the left side.  Pulmonary/Chest: Effort normal and breath sounds normal. No respiratory distress. She has no wheezes. She has no rales.  Abdominal: Soft. Bowel sounds are normal. There is no tenderness. There is no guarding.  Positive right-sided CVA tenderness  Musculoskeletal: Normal range of motion.       Back:  Full ROM to upper and lower extremities without difficulty  noted, negative ataxia noted.  Lymphadenopathy:    She has no cervical adenopathy.  Neurological: She is alert and oriented to person, place, and time. She exhibits normal muscle tone. Coordination normal.  Skin: Skin is warm and dry. No rash noted. She is not diaphoretic. No erythema.  Psychiatric: She has a normal mood and affect. Her behavior is normal. Thought content normal.    ED Course  Procedures (including critical care time)  CLINICAL DATA: Abnormal LFTs  EXAM:  ULTRASOUND ABDOMEN COMPLETE  COMPARISON: 09/14/2011  FINDINGS:  Gallbladder  No gallstones, gallbladder wall thickening, or pericholecystic  fluid. Negative sonographic Murphy's sign.  Common bile duct  Diameter: 2 mm.  Liver  No focal lesion identified. Within normal limits in parenchymal  echogenicity.  IVC  No abnormality visualized.  Pancreas  Visualized portions are unremarkable.  Spleen  Measures 6.3 cm. 12 mm splenule.  Right Kidney  Length: 9.5 mm. Echogenicity within normal limits. No mass or  hydronephrosis visualized.  Left Kidney  Length: 10.5 cm. Echogenicity within normal limits. No mass or  hydronephrosis visualized.  Abdominal aorta  No aneurysm visualized.  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  IMPRESSION:  14.1 cm multiloculated cystic lesion in the left upper abdomen,  possibly reflecting a lymphangioma when correlating with prior CT.  Otherwise negative abdominal ultrasound.  Electronically Signed  By: Julian Hy M.D.  On: 12/18/2012 10:07   *RADIOLOGY REPORT*  Clinical Data: Pain, fever, history of kidney infections  CT ABDOMEN AND PELVIS WITH CONTRAST  Technique: Multidetector CT imaging of the abdomen and pelvis was  performed following the standard protocol during bolus  administration of intravenous contrast.  Contrast: 56mL OMNIPAQUE  IOHEXOL 300 MG/ML SOLN  Comparison: Pelvic ultrasound dated 01/03/2010  Findings: Minimal dependent atelectasis the lung base.  Liver is notable for a tiny hypodense lesion in the lateral segment  right lobe (series 2/image 11).  Spleen, pancreas, adrenal glands are within normal limits.  Lobulated 7.6 x 7.5 x 12.4 cm fluid density lesion in the left  upper abdomen, distinct from the spleen, pancreatic tail, and left  kidney, although with mild mass effect displacing neighboring  structures. While incompletely characterized, the appearance  suggests a lymphangioma.  Gallbladder is unremarkable. No intrahepatic or extrahepatic  ductal dilatation.  Scattered areas of bilateral renal cortical scarring. No  hydronephrosis.  No evidence of bowel obstruction. Appendix is notable for high  density material within the lumen but without associated  inflammatory changes.  No evidence of abdominal aortic aneurysm.  Small volume pelvic ascites, simple, likely physiologic.  Uterine fundus is heterogeneous, likely reflecting adenomyomatosis  or an ill-defined fibroid when correlating with ultrasound. The  left ovary is within normal limits. Right ovary is notable for a  2.4 cm corpus luteal cyst.  Bladder is within normal limits.  Visualized osseous structures are within normal limits.  IMPRESSION:  No evidence of acute appendicitis.  2.4 cm right corpus  luteal cyst.  Heterogeneous uterine fundus, likely reflecting adenomyomatosis or  an ill-defined fibroid when correlating with prior ultrasound.  Lobulated 7.6 x 7.5 x 12.4 cm fluid density lesion in the left  upper abdomen, incompletely characterized, most suggestive of a  lymphangioma.  Results for orders placed during the hospital encounter of 05/25/13  PREGNANCY, URINE      Result Value Ref Range   Preg Test, Ur NEGATIVE  NEGATIVE  CBC      Result Value Ref Range   WBC 3.9 (*) 4.0 - 10.5 K/uL   RBC 3.75 (*) 3.87 - 5.11 MIL/uL    Hemoglobin 10.2 (*) 12.0 - 15.0 g/dL   HCT 31.9 (*) 36.0 - 46.0 %   MCV 85.1  78.0 - 100.0 fL   MCH 27.2  26.0 - 34.0 pg   MCHC 32.0  30.0 - 36.0 g/dL   RDW 13.3  11.5 - 15.5 %   Platelets 224  150 - 400 K/uL  COMPREHENSIVE METABOLIC PANEL      Result Value Ref Range   Sodium 135 (*) 137 - 147 mEq/L   Potassium 3.2 (*) 3.7 - 5.3 mEq/L   Chloride 91 (*) 96 - 112 mEq/L   CO2 31  19 - 32 mEq/L   Glucose, Bld 86  70 - 99 mg/dL   BUN 13  6 - 23 mg/dL   Creatinine, Ser 0.78  0.50 - 1.10 mg/dL   Calcium 9.4  8.4 - 10.5 mg/dL   Total Protein 6.7  6.0 - 8.3 g/dL   Albumin 3.9  3.5 - 5.2 g/dL   AST 24  0 - 37 U/L   ALT 21  0 - 35 U/L   Alkaline Phosphatase 51  39 - 117 U/L   Total Bilirubin 0.2 (*) 0.3 - 1.2 mg/dL   GFR calc non Af Amer >90  >90 mL/min   GFR calc Af Amer >90  >90 mL/min  URINALYSIS, ROUTINE W REFLEX MICROSCOPIC      Result Value Ref Range   Color, Urine YELLOW  YELLOW   APPearance CLEAR  CLEAR   Specific Gravity, Urine 1.004 (*) 1.005 - 1.030   pH 7.5  5.0 - 8.0   Glucose, UA NEGATIVE  NEGATIVE mg/dL   Hgb urine dipstick NEGATIVE  NEGATIVE   Bilirubin Urine NEGATIVE  NEGATIVE   Ketones, ur NEGATIVE  NEGATIVE mg/dL   Protein, ur NEGATIVE  NEGATIVE mg/dL   Urobilinogen, UA 0.2  0.0 - 1.0 mg/dL   Nitrite NEGATIVE  NEGATIVE   Leukocytes, UA NEGATIVE  NEGATIVE    Labs Review Labs Reviewed  CBC - Abnormal; Notable for the following:    WBC 3.9 (*)    RBC 3.75 (*)    Hemoglobin 10.2 (*)    HCT 31.9 (*)    All other components within normal limits  PREGNANCY, URINE  COMPREHENSIVE METABOLIC PANEL  URINALYSIS, ROUTINE W REFLEX MICROSCOPIC  POC URINE PREG, ED   Imaging Review No results found.   EKG Interpretation None      MDM   Final diagnoses:  None   Medications  ondansetron (ZOFRAN) injection 4 mg (4 mg Intravenous Not Given 05/25/13 1507)  iohexol (OMNIPAQUE) 300 MG/ML solution 50 mL (not administered)  sodium chloride 0.9 % bolus 1,000 mL (1,000  mLs Intravenous New Bag/Given 05/25/13 1448)   Filed Vitals:   05/25/13 1339  BP: 122/72  Pulse: 45  Temp: 97.6 F (36.4 C)  TempSrc: Oral  Resp: 16  Height: 5' 6.5" (1.689 m)  Weight: 107 lb (48.535 kg)  SpO2: 100%    Patient presenting to the ED with right sided flank pain that started this morning described as a burning sensation that is constant without radiation. Reported associated symptoms of nausea. Denied fever, chills, chest pain, and vomiting, diarrhea, blood in the stools. This provider reviewed patient's chart. Patient had an episode of pyelonephritis back in November 2012. 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 Alert and oriented. GCS 15. Heart rate and rhythm normal. Lungs clear to auscultation to upper and lower lobes bilaterally. Radial pulses 2+ bilaterally. Bowel sounds normal active in all 4 quadrants with negative pain upon palpation to the abdomen-negative peritoneal signs, negative acute abdominal signs. Positive CVA tenderness localized to the right side. CBC noted mildly low white blood cell count 3.9. CMP noted mildly low sodium of 135 potassium of 3.2-negative elevation of AST or ALT, BUN and creatinine within normal limits. Urinalysis negative for hemoglobin, nitrites or leukocytes-negative signs of infection or pyelonephritis. Urine pregnancy negative. Discussed case with attending physician who recommended CT abdomen and pelvis with contrast to be performed. Doubt pyelonephritis. Doubt UTI. CT abdomen and pelvis with contrast ordered.  Discussed case with Dalia Heading, PA-C. Transfer of care to [facility], PA-C at change in shift.    Jamse Mead, PA-C 05/26/13 1227

## 2013-05-25 NOTE — ED Notes (Addendum)
Pt from home c/ of right sided flank pain in which she woke up with this a.m.  Urinary frequency present but denies dysuria, hematuria, vomiting or nausea. Hx of kidney issues. Pt also voices concerns that she may have a parasite base on her research.

## 2013-05-25 NOTE — Discharge Instructions (Signed)
Follow up with your PCP. Your CT scan did not show any new findings.

## 2013-05-26 NOTE — ED Provider Notes (Signed)
Patient's CT scan was reviewed and there is no acute changes  noted.  The patient is feeling better.  We'll have her followup with her primary care Dr. told to return here as needed.  Given pain control for home.    Brent General, PA-C 05/26/13 [phone]

## 2013-05-27 NOTE — ED Provider Notes (Signed)
Medical screening examination/treatment/procedure(s) were conducted as a shared visit with non-physician practitioner(s) and myself.  I personally evaluated the patient during the encounter.  Pt c/o right side, r flank pain, constant. Dull. +tenderness right abd w palp. No rebound or guarding. Labs.     EKG Interpretation None      Results for orders placed during the hospital encounter of 05/25/13  PREGNANCY, URINE      Result Value Ref Range   Preg Test, Ur NEGATIVE  NEGATIVE  CBC      Result Value Ref Range   WBC 3.9 (*) 4.0 - 10.5 K/uL   RBC 3.75 (*) 3.87 - 5.11 MIL/uL   Hemoglobin 10.2 (*) 12.0 - 15.0 g/dL   HCT 31.9 (*) 36.0 - 46.0 %   MCV 85.1  78.0 - 100.0 fL   MCH 27.2  26.0 - 34.0 pg   MCHC 32.0  30.0 - 36.0 g/dL   RDW 13.3  11.5 - 15.5 %   Platelets 224  150 - 400 K/uL  COMPREHENSIVE METABOLIC PANEL      Result Value Ref Range   Sodium 135 (*) 137 - 147 mEq/L   Potassium 3.2 (*) 3.7 - 5.3 mEq/L   Chloride 91 (*) 96 - 112 mEq/L   CO2 31  19 - 32 mEq/L   Glucose, Bld 86  70 - 99 mg/dL   BUN 13  6 - 23 mg/dL   Creatinine, Ser 0.78  0.50 - 1.10 mg/dL   Calcium 9.4  8.4 - 10.5 mg/dL   Total Protein 6.7  6.0 - 8.3 g/dL   Albumin 3.9  3.5 - 5.2 g/dL   AST 24  0 - 37 U/L   ALT 21  0 - 35 U/L   Alkaline Phosphatase 51  39 - 117 U/L   Total Bilirubin 0.2 (*) 0.3 - 1.2 mg/dL   GFR calc non Af Amer >90  >90 mL/min   GFR calc Af Amer >90  >90 mL/min  URINALYSIS, ROUTINE W REFLEX MICROSCOPIC      Result Value Ref Range   Color, Urine YELLOW  YELLOW   APPearance CLEAR  CLEAR   Specific Gravity, Urine 1.004 (*) 1.005 - 1.030   pH 7.5  5.0 - 8.0   Glucose, UA NEGATIVE  NEGATIVE mg/dL   Hgb urine dipstick NEGATIVE  NEGATIVE   Bilirubin Urine NEGATIVE  NEGATIVE   Ketones, ur NEGATIVE  NEGATIVE mg/dL   Protein, ur NEGATIVE  NEGATIVE mg/dL   Urobilinogen, UA 0.2  0.0 - 1.0 mg/dL   Nitrite NEGATIVE  NEGATIVE   Leukocytes, UA NEGATIVE  NEGATIVE   Ct Abdomen Pelvis W  Contrast  05/25/2013   CLINICAL DATA:  Abdominal pain, right flank pain.  EXAM: CT ABDOMEN AND PELVIS WITH CONTRAST  TECHNIQUE: Multidetector CT imaging of the abdomen and pelvis was performed using the standard protocol following bolus administration of intravenous contrast.  CONTRAST:  2mL OMNIPAQUE IOHEXOL 300 MG/ML SOLN, 180mL OMNIPAQUE IOHEXOL 300 MG/ML SOLN  COMPARISON:  US ABDOMEN COMPLETE dated 12/18/2012; CT ABD/PELVIS W CM dated 07/07/2011  FINDINGS: Lung bases are clear. No effusions. Heart is normal size.  Large loculated fluid collection noted in the left upper quadrant adjacent to the spleen and left kidney. This measures 8.9 x 6.4 cm and is essentially unchanged when compared to prior study. This may represent a lymphangioma or other benign fluid collection.  Liver, gallbladder, spleen, pancreas, adrenals are unremarkable. Areas of cortical thinning and scarring in the  kidneys bilaterally, stable. No hydronephrosis. Urinary bladder is distended.  Uterus and adnexa are unremarkable. No free fluid, free air or adenopathy.  Moderate stool throughout the colon. Nonobstructive bowel gas pattern. Appendix is visualized and is normal.  IMPRESSION: No acute findings in the abdomen or pelvis.  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   Electronically Signed   By: Rolm Baptise M.D.   On: 05/25/2013 17:04      Mirna Mires, MD 05/27/13 [phone]

## 2013-05-28 NOTE — ED Provider Notes (Signed)
Medical screening examination/treatment/procedure(s) were performed by non-physician practitioner and as supervising physician I was immediately available for consultation/collaboration.   EKG Interpretation None       Orlie Dakin, MD 05/28/13 [phone]

## 2014-02-10 ENCOUNTER — Other Ambulatory Visit: Payer: Self-pay

## 2014-02-10 DIAGNOSIS — Z1231 Encounter for screening mammogram for malignant neoplasm of breast: Secondary | ICD-10-CM

## 2014-02-26 ENCOUNTER — Ambulatory Visit
Admission: RE | Admit: 2014-02-26 | Discharge: 2014-02-26 | Disposition: A | Discharge status: Home / Self Care | Payer: Self-pay | Source: Ambulatory Visit

## 2014-02-26 DIAGNOSIS — Z1231 Encounter for screening mammogram for malignant neoplasm of breast: Secondary | ICD-10-CM

## 2014-04-14 ENCOUNTER — Emergency Department (HOSPITAL_COMMUNITY): Payer: Medicare Other

## 2014-04-14 ENCOUNTER — Encounter (HOSPITAL_COMMUNITY): Payer: Self-pay

## 2014-04-14 ENCOUNTER — Encounter (HOSPITAL_COMMUNITY): Admission: EM | Disposition: A | Discharge status: Home / Self Care | Payer: Self-pay | Source: Home / Self Care

## 2014-04-14 ENCOUNTER — Inpatient Hospital Stay (HOSPITAL_COMMUNITY)
Admission: EM | Admit: 2014-04-14 | Discharge: 2014-04-15 | DRG: 339 | Disposition: A | Discharge status: Home / Self Care | Payer: Medicare Other | Attending: Surgery | Admitting: Surgery

## 2014-04-14 ENCOUNTER — Observation Stay (HOSPITAL_COMMUNITY): Payer: Medicare Other | Admitting: Anesthesiology

## 2014-04-14 DIAGNOSIS — Z87891 Personal history of nicotine dependence: Secondary | ICD-10-CM

## 2014-04-14 DIAGNOSIS — K3580 Unspecified acute appendicitis: Secondary | ICD-10-CM

## 2014-04-14 DIAGNOSIS — K3589 Other acute appendicitis without perforation or gangrene: Secondary | ICD-10-CM

## 2014-04-14 DIAGNOSIS — R1032 Left lower quadrant pain: Secondary | ICD-10-CM | POA: Diagnosis present

## 2014-04-14 DIAGNOSIS — R52 Pain, unspecified: Secondary | ICD-10-CM

## 2014-04-14 DIAGNOSIS — R109 Unspecified abdominal pain: Secondary | ICD-10-CM

## 2014-04-14 DIAGNOSIS — K353 Acute appendicitis with localized peritonitis: Principal | ICD-10-CM | POA: Diagnosis present

## 2014-04-14 DIAGNOSIS — Z681 Body mass index (BMI) 19 or less, adult: Secondary | ICD-10-CM

## 2014-04-14 DIAGNOSIS — E46 Unspecified protein-calorie malnutrition: Secondary | ICD-10-CM | POA: Diagnosis present

## 2014-04-14 DIAGNOSIS — Z79899 Other long term (current) drug therapy: Secondary | ICD-10-CM

## 2014-04-14 DIAGNOSIS — D181 Lymphangioma, any site: Secondary | ICD-10-CM | POA: Diagnosis present

## 2014-04-14 DIAGNOSIS — Z87440 Personal history of urinary (tract) infections: Secondary | ICD-10-CM | POA: Diagnosis not present

## 2014-04-14 HISTORY — PX: LAPAROSCOPIC APPENDECTOMY: SHX408

## 2014-04-14 HISTORY — DX: Unspecified acute appendicitis: K35.80

## 2014-04-14 LAB — I-STAT CHEM 8, ED
BUN: 12 mg/dL (ref 6–23)
Calcium, Ion: 1.12 mmol/L (ref 1.12–1.23)
Chloride: 97 mmol/L (ref 96–112)
Creatinine, Ser: 0.9 mg/dL (ref 0.50–1.10)
Glucose, Bld: 109 mg/dL — ABNORMAL HIGH (ref 70–99)
HCT: 36 % (ref 36.0–46.0)
Hemoglobin: 12.2 g/dL (ref 12.0–15.0)
Potassium: 3.1 mmol/L — ABNORMAL LOW (ref 3.5–5.1)
Sodium: 136 mmol/L (ref 135–145)
TCO2: 24 mmol/L (ref 0–100)

## 2014-04-14 LAB — CBC WITH DIFFERENTIAL/PLATELET
Basophils Absolute: 0 10*3/uL (ref 0.0–0.1)
Basophils Relative: 0 % (ref 0–1)
Eosinophils Absolute: 0 10*3/uL (ref 0.0–0.7)
Eosinophils Relative: 0 % (ref 0–5)
HCT: 32.2 % — ABNORMAL LOW (ref 36.0–46.0)
Hemoglobin: 10.4 g/dL — ABNORMAL LOW (ref 12.0–15.0)
Lymphocytes Relative: 8 % — ABNORMAL LOW (ref 12–46)
Lymphs Abs: 0.8 10*3/uL (ref 0.7–4.0)
MCH: 27.9 pg (ref 26.0–34.0)
MCHC: 32.3 g/dL (ref 30.0–36.0)
MCV: 86.3 fL (ref 78.0–100.0)
Monocytes Absolute: 0.6 10*3/uL (ref 0.1–1.0)
Monocytes Relative: 6 % (ref 3–12)
Neutro Abs: 9 10*3/uL — ABNORMAL HIGH (ref 1.7–7.7)
Neutrophils Relative %: 86 % — ABNORMAL HIGH (ref 43–77)
Platelets: 281 10*3/uL (ref 150–400)
RBC: 3.73 MIL/uL — ABNORMAL LOW (ref 3.87–5.11)
RDW: 14.2 % (ref 11.5–15.5)
WBC: 10.4 10*3/uL (ref 4.0–10.5)

## 2014-04-14 LAB — AMYLASE: Amylase: 333 U/L — ABNORMAL HIGH (ref 0–105)

## 2014-04-14 LAB — LIPASE, BLOOD: Lipase: 35 U/L (ref 11–59)

## 2014-04-14 LAB — URINALYSIS, ROUTINE W REFLEX MICROSCOPIC
Bilirubin Urine: NEGATIVE
Glucose, UA: NEGATIVE mg/dL
Hgb urine dipstick: NEGATIVE
Ketones, ur: NEGATIVE mg/dL
Leukocytes, UA: NEGATIVE
Nitrite: NEGATIVE
Protein, ur: 30 mg/dL — AB
Specific Gravity, Urine: 1.016 (ref 1.005–1.030)
Urobilinogen, UA: 0.2 mg/dL (ref 0.0–1.0)
pH: 8.5 — ABNORMAL HIGH (ref 5.0–8.0)

## 2014-04-14 LAB — URINE MICROSCOPIC-ADD ON

## 2014-04-14 SURGERY — APPENDECTOMY, LAPAROSCOPIC
Anesthesia: General | Site: Abdomen

## 2014-04-14 MED ORDER — 0.9 % SODIUM CHLORIDE (POUR BTL) OPTIME
TOPICAL | Status: DC | PRN
  Administered 2014-04-14: 1000 mL

## 2014-04-14 MED ORDER — DEXAMETHASONE SODIUM PHOSPHATE 10 MG/ML IJ SOLN
INTRAMUSCULAR | Status: DC | PRN
  Administered 2014-04-14: 10 mg via INTRAVENOUS

## 2014-04-14 MED ORDER — LACTATED RINGERS IR SOLN
Status: DC | PRN
  Administered 2014-04-14: 1000 mL

## 2014-04-14 MED ORDER — ONDANSETRON HCL 4 MG/2ML IJ SOLN
INTRAMUSCULAR | Status: DC | PRN
  Administered 2014-04-14: 4 mg via INTRAVENOUS

## 2014-04-14 MED ORDER — SUCCINYLCHOLINE CHLORIDE 20 MG/ML IJ SOLN
INTRAMUSCULAR | Status: DC | PRN
  Administered 2014-04-14: 100 mg via INTRAVENOUS

## 2014-04-14 MED ORDER — FENTANYL CITRATE 0.05 MG/ML IJ SOLN
INTRAMUSCULAR | Status: DC | PRN
Start: 2014-04-14 — End: 2014-04-14
  Administered 2014-04-14: 25 ug via INTRAVENOUS
  Administered 2014-04-14 (×2): 50 ug via INTRAVENOUS
  Administered 2014-04-14: 25 ug via INTRAVENOUS
  Administered 2014-04-14: 100 ug via INTRAVENOUS

## 2014-04-14 MED ORDER — FENTANYL CITRATE 0.05 MG/ML IJ SOLN
100.0000 ug | Freq: Once | INTRAMUSCULAR | Status: AC
  Administered 2014-04-14: 100 ug via INTRAVENOUS
  Filled 2014-04-14: qty 2

## 2014-04-14 MED ORDER — BUPIVACAINE-EPINEPHRINE (PF) 0.25% -1:200000 IJ SOLN
INTRAMUSCULAR | Status: AC
  Filled 2014-04-14: qty 30

## 2014-04-14 MED ORDER — HYDROMORPHONE HCL 1 MG/ML IJ SOLN
1.0000 mg | Freq: Once | INTRAMUSCULAR | Status: AC
  Administered 2014-04-14: 1 mg via INTRAVENOUS
  Filled 2014-04-14: qty 1

## 2014-04-14 MED ORDER — HYDROMORPHONE HCL 1 MG/ML IJ SOLN
INTRAMUSCULAR | Status: AC
  Filled 2014-04-14: qty 1

## 2014-04-14 MED ORDER — DIPHENHYDRAMINE HCL 25 MG PO CAPS
25.0000 mg | ORAL_CAPSULE | Freq: Four times a day (QID) | ORAL | Status: DC | PRN
  Filled 2014-04-14: qty 1

## 2014-04-14 MED ORDER — ONDANSETRON HCL 4 MG/2ML IJ SOLN
4.0000 mg | Freq: Once | INTRAMUSCULAR | Status: AC
  Administered 2014-04-14: 4 mg via INTRAVENOUS
  Filled 2014-04-14: qty 2

## 2014-04-14 MED ORDER — POTASSIUM CHLORIDE IN NACL 40-0.9 MEQ/L-% IV SOLN
INTRAVENOUS | Status: DC
  Filled 2014-04-14 (×2): qty 1000

## 2014-04-14 MED ORDER — DEXTROSE 5 % IV SOLN
1.0000 g | Freq: Four times a day (QID) | INTRAVENOUS | Status: DC
  Administered 2014-04-14 – 2014-04-15 (×5): 1 g via INTRAVENOUS
  Filled 2014-04-14 (×6): qty 1

## 2014-04-14 MED ORDER — LACTATED RINGERS IV SOLN
INTRAVENOUS | Status: DC
  Administered 2014-04-14: 13:00:00 via INTRAVENOUS
  Administered 2014-04-14: 1000 mL via INTRAVENOUS

## 2014-04-14 MED ORDER — MIDAZOLAM HCL 5 MG/5ML IJ SOLN
INTRAMUSCULAR | Status: DC | PRN
  Administered 2014-04-14: 2 mg via INTRAVENOUS

## 2014-04-14 MED ORDER — SODIUM CHLORIDE 0.9 % IV BOLUS (SEPSIS)
1000.0000 mL | Freq: Once | INTRAVENOUS | Status: AC
  Administered 2014-04-14: 1000 mL via INTRAVENOUS

## 2014-04-14 MED ORDER — ONDANSETRON HCL 4 MG PO TABS
4.0000 mg | ORAL_TABLET | Freq: Four times a day (QID) | ORAL | Status: DC | PRN
  Administered 2014-04-15: 4 mg via ORAL
  Filled 2014-04-14: qty 1

## 2014-04-14 MED ORDER — LACTATED RINGERS IV SOLN: INTRAVENOUS | Status: DC

## 2014-04-14 MED ORDER — ONDANSETRON HCL 4 MG/2ML IJ SOLN: 4.0000 mg | Freq: Four times a day (QID) | INTRAMUSCULAR | Status: DC | PRN

## 2014-04-14 MED ORDER — DIPHENHYDRAMINE HCL 50 MG/ML IJ SOLN
INTRAMUSCULAR | Status: AC
  Filled 2014-04-14: qty 1

## 2014-04-14 MED ORDER — LIDOCAINE HCL (PF) 1 % IJ SOLN
INTRAMUSCULAR | Status: DC | PRN
  Administered 2014-04-14: 7.5 mL

## 2014-04-14 MED ORDER — LIDOCAINE HCL 1 % IJ SOLN
INTRAMUSCULAR | Status: AC
  Filled 2014-04-14: qty 20

## 2014-04-14 MED ORDER — NEOSTIGMINE METHYLSULFATE 10 MG/10ML IV SOLN
INTRAVENOUS | Status: DC | PRN
  Administered 2014-04-14: 4 mg via INTRAVENOUS

## 2014-04-14 MED ORDER — TRAZODONE HCL 100 MG PO TABS
100.0000 mg | ORAL_TABLET | Freq: Every day | ORAL | Status: DC
  Administered 2014-04-14: 100 mg via ORAL
  Filled 2014-04-14: qty 1
  Filled 2014-04-14 (×2): qty 2
  Filled 2014-04-14: qty 1

## 2014-04-14 MED ORDER — DEXAMETHASONE SODIUM PHOSPHATE 10 MG/ML IJ SOLN
INTRAMUSCULAR | Status: AC
  Filled 2014-04-14: qty 1

## 2014-04-14 MED ORDER — GLYCOPYRROLATE 0.2 MG/ML IJ SOLN
INTRAMUSCULAR | Status: AC
  Filled 2014-04-14: qty 3

## 2014-04-14 MED ORDER — SODIUM CHLORIDE 0.9 % IV SOLN
INTRAVENOUS | Status: DC
  Administered 2014-04-14: 09:00:00 via INTRAVENOUS

## 2014-04-14 MED ORDER — MORPHINE SULFATE 2 MG/ML IJ SOLN: 1.0000 mg | INTRAMUSCULAR | Status: DC | PRN

## 2014-04-14 MED ORDER — HEPARIN SODIUM (PORCINE) 5000 UNIT/ML IJ SOLN
5000.0000 [IU] | Freq: Three times a day (TID) | INTRAMUSCULAR | Status: DC
  Administered 2014-04-15: 5000 [IU] via SUBCUTANEOUS
  Filled 2014-04-14 (×4): qty 1

## 2014-04-14 MED ORDER — DIPHENHYDRAMINE HCL 50 MG/ML IJ SOLN: 12.5000 mg | Freq: Four times a day (QID) | INTRAMUSCULAR | Status: DC | PRN

## 2014-04-14 MED ORDER — PROPOFOL 10 MG/ML IV BOLUS
INTRAVENOUS | Status: DC | PRN
  Administered 2014-04-14: 100 mg via INTRAVENOUS

## 2014-04-14 MED ORDER — HYDROMORPHONE HCL 1 MG/ML IJ SOLN
0.2500 mg | INTRAMUSCULAR | Status: DC | PRN
  Administered 2014-04-14 (×2): 0.5 mg via INTRAVENOUS

## 2014-04-14 MED ORDER — OXYCODONE-ACETAMINOPHEN 5-325 MG PO TABS
1.0000 | ORAL_TABLET | ORAL | Status: DC | PRN
  Administered 2014-04-14 – 2014-04-15 (×3): 2 via ORAL
  Filled 2014-04-14 (×4): qty 2

## 2014-04-14 MED ORDER — KCL IN DEXTROSE-NACL 20-5-0.45 MEQ/L-%-% IV SOLN
INTRAVENOUS | Status: DC
  Administered 2014-04-14 – 2014-04-15 (×2): via INTRAVENOUS
  Filled 2014-04-14 (×4): qty 1000

## 2014-04-14 MED ORDER — ONDANSETRON HCL 4 MG/2ML IJ SOLN
INTRAMUSCULAR | Status: AC
  Filled 2014-04-14: qty 2

## 2014-04-14 MED ORDER — CISATRACURIUM BESYLATE (PF) 10 MG/5ML IV SOLN
INTRAVENOUS | Status: DC | PRN
  Administered 2014-04-14: 4 mg via INTRAVENOUS

## 2014-04-14 MED ORDER — BUPIVACAINE-EPINEPHRINE 0.25% -1:200000 IJ SOLN
INTRAMUSCULAR | Status: DC | PRN
  Administered 2014-04-14: 7.5 mL

## 2014-04-14 MED ORDER — MIDAZOLAM HCL 2 MG/2ML IJ SOLN
INTRAMUSCULAR | Status: AC
  Filled 2014-04-14: qty 2

## 2014-04-14 MED ORDER — PROPOFOL 10 MG/ML IV BOLUS
INTRAVENOUS | Status: AC
  Filled 2014-04-14: qty 20

## 2014-04-14 MED ORDER — GLYCOPYRROLATE 0.2 MG/ML IJ SOLN
INTRAMUSCULAR | Status: DC | PRN
  Administered 2014-04-14: 0.6 mg via INTRAVENOUS

## 2014-04-14 MED ORDER — DIPHENHYDRAMINE HCL 12.5 MG/5ML PO ELIX: 12.5000 mg | ORAL_SOLUTION | Freq: Four times a day (QID) | ORAL | Status: DC | PRN

## 2014-04-14 MED ORDER — MORPHINE SULFATE 2 MG/ML IJ SOLN
1.0000 mg | INTRAMUSCULAR | Status: DC | PRN
  Administered 2014-04-14: 1 mg via INTRAVENOUS
  Administered 2014-04-14: 2 mg via INTRAVENOUS
  Filled 2014-04-14: qty 2

## 2014-04-14 MED ORDER — DIPHENHYDRAMINE HCL 50 MG/ML IJ SOLN
12.5000 mg | INTRAMUSCULAR | Status: DC | PRN
  Administered 2014-04-14: 12.5 mg via INTRAVENOUS

## 2014-04-14 MED ORDER — FENTANYL CITRATE 0.05 MG/ML IJ SOLN
INTRAMUSCULAR | Status: AC
  Filled 2014-04-14: qty 5

## 2014-04-14 MED ORDER — NEOSTIGMINE METHYLSULFATE 10 MG/10ML IV SOLN
INTRAVENOUS | Status: AC
Start: 2014-04-14 — End: 2014-04-14
  Filled 2014-04-14: qty 1

## 2014-04-14 SURGICAL SUPPLY — 32 items
APPLIER CLIP ROT 10 11.4 M/L (STAPLE)
CLIP APPLIE ROT 10 11.4 M/L (STAPLE) IMPLANT
CUTTER FLEX LINEAR 45M (STAPLE) IMPLANT
DECANTER SPIKE VIAL GLASS SM (MISCELLANEOUS) ×2 IMPLANT
DRAPE LAPAROSCOPIC ABDOMINAL (DRAPES) ×2 IMPLANT
DRAPE WARM FLUID 44X44 (DRAPE) ×2 IMPLANT
ELECT REM PT RETURN 9FT ADLT (ELECTROSURGICAL) ×2
ELECTRODE REM PT RTRN 9FT ADLT (ELECTROSURGICAL) ×1 IMPLANT
ENDOLOOP SUT PDS II  0 18 (SUTURE)
ENDOLOOP SUT PDS II 0 18 (SUTURE) IMPLANT
GLOVE BIO SURGEON STRL SZ 6 (GLOVE) ×8 IMPLANT
GLOVE INDICATOR 6.5 STRL GRN (GLOVE) ×6 IMPLANT
GOWN SPEC L4 XLG W/TWL (GOWN DISPOSABLE) ×6 IMPLANT
GOWN STRL REUS W/ TWL XL LVL3 (GOWN DISPOSABLE) ×3 IMPLANT
GOWN STRL REUS W/TWL 2XL LVL3 (GOWN DISPOSABLE) ×2 IMPLANT
GOWN STRL REUS W/TWL XL LVL3 (GOWN DISPOSABLE) ×3
KIT BASIN OR (CUSTOM PROCEDURE TRAY) ×2 IMPLANT
LIQUID BAND (GAUZE/BANDAGES/DRESSINGS) ×2 IMPLANT
POUCH SPECIMEN RETRIEVAL 10MM (ENDOMECHANICALS) ×2 IMPLANT
RELOAD 45 VASCULAR/THIN (ENDOMECHANICALS) IMPLANT
RELOAD STAPLE TA45 3.5 REG BLU (ENDOMECHANICALS) ×2 IMPLANT
SET IRRIG TUBING LAPAROSCOPIC (IRRIGATION / IRRIGATOR) ×2 IMPLANT
SHEARS HARMONIC ACE PLUS 36CM (ENDOMECHANICALS) ×2 IMPLANT
SLEEVE XCEL OPT CAN 5 100 (ENDOMECHANICALS) ×2 IMPLANT
SOLUTION ANTI FOG 6CC (MISCELLANEOUS) ×2 IMPLANT
SUT MNCRL AB 4-0 PS2 18 (SUTURE) ×2 IMPLANT
TOWEL OR 17X26 10 PK STRL BLUE (TOWEL DISPOSABLE) ×2 IMPLANT
TRAY FOLEY CATH 14FRSI W/METER (CATHETERS) IMPLANT
TRAY LAPAROSCOPIC (CUSTOM PROCEDURE TRAY) ×2 IMPLANT
TROCAR BLADELESS OPT 5 100 (ENDOMECHANICALS) ×2 IMPLANT
TROCAR XCEL BLUNT TIP 100MML (ENDOMECHANICALS) ×2 IMPLANT
TUBING INSUFFLATION 10FT LAP (TUBING) ×2 IMPLANT

## 2014-04-14 NOTE — Progress Notes (Signed)
Patient resting in bed with family in room.  No signs of distress noted.  Incision sites CD & I with no signs of swelling, redness, or discharge.  Patient rates minimal abdominal pain at 4.  Patient told to call nurse if pain increases.  Patient verbalized understanding.

## 2014-04-14 NOTE — ED Provider Notes (Signed)
CSN: [id number]     Arrival date & time 04/14/14  0600 History   First MD Initiated Contact with Patient 04/14/14 0735     Chief Complaint  Patient presents with  . Flank Pain     (Consider location/radiation/quality/duration/timing/severity/associated sxs/prior Treatment) HPI Comments: Pt here with sudden onset of right flank and lower quadrant abd pain x 1 day--sx described and colicky and sharp--no prior h/o renal stone--non bilious/bloody emesis noted without fever--denies dysuria--on her menses currently--no associated with food--no tx used pta--h/o of uti but this is different, given pain meds here on arrival and feels slightly better  Patient is a 44 y.o. female presenting with flank pain. The history is provided by the patient.  Flank Pain    Past Medical History  Diagnosis Date  . Kidney infection   . Hemorrhoids     bleeding  . Depression   . Anxiety    Past Surgical History  Procedure Laterality Date  . Tubal ligation    . Cesarean section    . Wisdon teeth     Family History  Problem Relation Age of Onset  . Mental illness Mother   . Heart disease Father   . Diabetes Sister   . Diabetes Brother   . Heart disease Paternal Grandfather    History  Substance Use Topics  . Smoking status: Former Smoker    Quit date: 12/19/2007  . Smokeless tobacco: Not on file  . Alcohol Use: Yes     Comment: Hx of alcohol dependence-now reports that she only drinks etoh occasionally   OB History    No data available     Review of Systems  Genitourinary: Positive for flank pain.  All other systems reviewed and are negative.     Allergies  Review of patient's allergies indicates no known allergies.  Home Medications   Prior to Admission medications   Medication Sig Start Date End Date Taking? Authorizing Provider  b complex vitamins capsule Take 1 capsule by mouth every morning.   Yes Historical Provider, MD  Multiple Vitamin (MULTIVITAMIN WITH MINERALS) TABS  tablet Take 1 tablet by mouth daily.   Yes Historical Provider, MD  potassium chloride (K-DUR) 10 MEQ tablet Take 10 mEq by mouth 2 (two) times daily.   Yes Historical Provider, MD  simethicone (MYLICON) 80 MG chewable tablet Chew 160 mg by mouth every 6 (six) hours as needed for flatulence.   Yes Historical Provider, MD  traZODone (DESYREL) 100 MG tablet Take 100 mg by mouth at bedtime.   Yes Historical Provider, MD  HYDROcodone-acetaminophen (NORCO/VICODIN) 5-325 MG per tablet Take 1 tablet by mouth every 6 (six) hours as needed for moderate pain. Patient not taking: Reported on 04/14/2014 05/25/13   Resa Miner Lawyer, PA-C   BP 125/86 mmHg  Pulse 60  Temp(Src) 98.5 F (36.9 C) (Oral)  Resp 18  Ht 5\' 6"  (1.676 m)  Wt 110 lb (49.896 kg)  BMI 17.76 kg/m2  SpO2 100%  LMP 04/14/2014 Physical Exam  Constitutional: She is oriented to person, place, and time. She appears well-developed and well-nourished.  Non-toxic appearance. No distress.  HENT:  Head: Normocephalic and atraumatic.  Eyes: Conjunctivae, EOM and lids are normal. Pupils are equal, round, and reactive to light.  Neck: Normal range of motion. Neck supple. No tracheal deviation present. No thyroid mass present.  Cardiovascular: Normal rate, regular rhythm and normal heart sounds.  Exam reveals no gallop.   No murmur heard. Pulmonary/Chest: Effort normal and breath sounds  normal. No stridor. No respiratory distress. She has no decreased breath sounds. She has no wheezes. She has no rhonchi. She has no rales.  Abdominal: Soft. Normal appearance and bowel sounds are normal. She exhibits no distension. There is tenderness in the right lower quadrant. There is no rigidity, no rebound, no guarding and no CVA tenderness.    Musculoskeletal: Normal range of motion. She exhibits no edema or tenderness.  Neurological: She is alert and oriented to person, place, and time. She has normal strength. No cranial nerve deficit or sensory  deficit. GCS eye subscore is 4. GCS verbal subscore is 5. GCS motor subscore is 6.  Skin: Skin is warm and dry. No abrasion and no rash noted.  Psychiatric: She has a normal mood and affect. Her speech is normal and behavior is normal.  Nursing note and vitals reviewed.   ED Course  Procedures (including critical care time) Labs Review Labs Reviewed  AMYLASE - Abnormal; Notable for the following:    Amylase 333 (*)    All other components within normal limits  CBC WITH DIFFERENTIAL/PLATELET - Abnormal; Notable for the following:    RBC 3.73 (*)    Hemoglobin 10.4 (*)    HCT 32.2 (*)    Neutrophils Relative % 86 (*)    Neutro Abs 9.0 (*)    Lymphocytes Relative 8 (*)    All other components within normal limits  I-STAT CHEM 8, ED - Abnormal; Notable for the following:    Potassium 3.1 (*)    Glucose, Bld 109 (*)    All other components within normal limits  LIPASE, BLOOD  URINALYSIS, ROUTINE W REFLEX MICROSCOPIC    Imaging Review No results found.   EKG Interpretation None      MDM   Final diagnoses:  Abdominal pain  Pain    Pt given pain meds here, ct with likely appy, surgery to see    Leota Jacobsen, MD 04/14/14 [phone]

## 2014-04-14 NOTE — Progress Notes (Signed)
Pt itching all over; anesthes, Dr. Orene Desanctis notified, order rec'd and med given

## 2014-04-14 NOTE — Op Note (Signed)
Appendectomy, Lap, Procedure Note  Indications: The patient presented with a history of right-sided abdominal pain. A CT revealed findings consistent with acute appendicitis.  Pre-operative Diagnosis: Acute supporative appendicitis with localized peritonitis  Post-operative Diagnosis: Same  Surgeon: Stark Klein   Assistants: Chester Holstein, PA-S  Anesthesia: General endotracheal anesthesia and Local anesthesia 1% plain lidocaine, 0.25.% bupivacaine, with epinephrine  ASA Class: 2  Procedure Details  The patient was seen again in the Holding Room. The risks, benefits, complications, treatment options, and expected outcomes were discussed with the patient and/or family. The possibilities of perforation of viscus, bleeding, recurrent infection, the need for additional procedures, failure to diagnose a condition, and creating a complication requiring transfusion or operation were discussed. There was concurrence with the proposed plan and informed consent was obtained. The site of surgery was properly noted. The patient was taken to Operating Room, identified as Cassandra Cunningham and the procedure verified as Appendectomy. A Time Out was held and the above information confirmed.  The patient was placed in the supine position and general anesthesia was induced, along with placement of orogastric tube, Venodyne boots, and a Foley catheter. The abdomen was prepped and draped in a sterile fashion. Local anesthetic was infiltrated in the infraumbilical region.  A 1.5 cm curvilinear transverse incision was made just below the umbilicus.  The Kelly clamp was used to spread the subcutaneous tissues.  The fascia was elevated with 2 Kocher clamps and incised with the #11 blade.  A Claiborne Billings was used to confirm entrance into the peritoneal cavity.  A pursestring suture was placed around the fascial incision.  The Hasson trocar was inserted into the abdomen and held in place with the tails of the suture.  The  pneumoperitoneum was then established to steady pressure of 15 mmHg.     Additional 5 mm cannulas then placed in the left lower quadrant of the abdomen and the suprapubic region under direct visualization.  A careful evaluation of the entire abdomen was carried out. The patient was placed in Trendelenburg and rotated to the left.  The small intestines were retracted in the cephalad and left lateral direction away from the pelvis and right lower quadrant. The patient was found to have an enlarged and inflamed appendix that was extending into the pelvis. There was no evidence of perforation.  The appendix was carefully dissected. The appendix was was skeletonized with the harmonic scalpel.   The appendix was divided at its base using an endo-GIA stapler. Minimal appendiceal stump was left in place. The appendix was removed from the abdomen with an Endocatch bag through the left subcostal port.  There was no evidence of bleeding, leakage, or complication after division of the appendix. Irrigation was also performed and irrigate suctioned from the abdomen as well.  The 5 mm trocars were removed.  The pneumoperitoneum was evacuated from the abdomen.    The trocar site skin wounds were closed with 4-0 Monocryl and dressed with Dermabond.  Instrument, sponge, and needle counts were correct at the conclusion of the case.   Findings: The appendix was found to be inflamed. There were signs of necrosis.  There was not perforation. There was not abscess formation, however there was supporative fluid throughout the pelvis.    Estimated Blood Loss:  Minimal        Specimens: appendix         Complications:  None; patient tolerated the procedure well.         Disposition: PACU - hemodynamically  stable.         Condition: stable

## 2014-04-14 NOTE — ED Notes (Signed)
Pt complains of right sided pain since yesterday, no hematuria, no hx of kidney stones, pt states shes had kidney infection but the pain has never been this bad.

## 2014-04-14 NOTE — Anesthesia Procedure Notes (Signed)
Performed by: Dione Booze Pre-anesthesia Checklist: Patient identified, Emergency Drugs available and Suction available Patient Re-evaluated:Patient Re-evaluated prior to inductionOxygen Delivery Method: Circle system utilized Preoxygenation: Pre-oxygenation with 100% oxygen Intubation Type: IV induction, Inhalational induction with existing ETT, Rapid sequence and Cricoid Pressure applied Laryngoscope Size: Mac and 4 Grade View: Grade I Tube type: Oral Tube size: 7.5 mm Number of attempts: 1 Airway Equipment and Method: Stylet Placement Confirmation: ETT inserted through vocal cords under direct vision,  positive ETCO2 and breath sounds checked- equal and bilateral Secured at: 21 cm Tube secured with: Tape Dental Injury: Teeth and Oropharynx as per pre-operative assessment

## 2014-04-14 NOTE — Anesthesia Postprocedure Evaluation (Signed)
  Anesthesia Post-op Note  Patient: Cassandra Cunningham  Procedure(s) Performed: Procedure(s): APPENDECTOMY LAPAROSCOPIC (N/A)  Patient Location: PACU  Anesthesia Type:General  Level of Consciousness: awake and sedated  Airway and Oxygen Therapy: Patient Spontanous Breathing  Post-op Pain: mild  Post-op Assessment: Post-op Vital signs reviewed  Post-op Vital Signs: stable  Last Vitals:  Filed Vitals:   04/14/14 1430  BP: 93/56  Pulse: 53  Temp: 36.6 C  Resp: 16    Complications: No apparent anesthesia complications

## 2014-04-14 NOTE — H&P (Signed)
Cassandra Cunningham is an 44 y.o. female.   PCP:  Benito Mccreedy, MD  Chief Complaint:  Right sided pain,  HPI: Pt started having some pain right side yesterday around 2 PM.  She was worried it might be kidney stones or a UTI, but it didn't feel like that.  She was able to eat and drink last PM, and last ate about 8 pm yesterday.  She awoke with more pain about MN-1AM.  She had nausea and vomiting with it.. It has not gotten better.  Work up in the ED.  UA shows some protein, K+ 3.1, amalyase 333, lipase 35.  WBC 10.4 with left shift.  Renal CT protocol shows no renal stone, but appendix containing large appendicoliths. Inflammation with haziness of fat planes greatest in the right lower quadrant and lower pelvis without drainable abscess identified. The presence of adjacent fluid filled loops of small bowel.  It looks very much like appendicitis. She also has a Large septated cystic structure posterior to the pancreatic body and tail in the splenic hilar region with extension into the left pericolic gutter reaching the level just below the left iliac wing. This has changed configuration slightly since 2013 but has an appearance most suggestive of lymphangioma as previously noted. This spans over 18 cm.  We are ask to see.  Past Medical History  Diagnosis Date  Hx of Uti   Hx of tobacco use  Depression  (hospitalized 10/9-10/15/2014 for Major depressive illness) She has follow up scheduled, off most meds since Christmas.  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 Tubal ligation    . Cesarean section    . Wisdon teeth      Family History  Problem Relation Age of Onset  . Mental illness Mother   . Heart disease Father   . Diabetes Sister   . Diabetes Brother   . Heart disease Paternal Grandfather    Social History:  reports that she quit  smoking about 6 years ago. She does not have any smokeless tobacco history on file. She reports that she drinks alcohol. She reports that she does not use illicit drugs. Hx of tobacco and ETOH use-  Heavy in the past  None for 6 years.  Street drugs as a teen, none since.   Allergies: No Known Allergies  Prior to Admission medications   Medication Sig Start Date End Date Taking? Authorizing Provider  b complex vitamins capsule Take 1 capsule by mouth every morning.   Yes Historical Provider, MD  Multiple Vitamin (MULTIVITAMIN WITH MINERALS) TABS tablet Take 1 tablet by mouth daily.   Yes Historical Provider, MD  potassium chloride (K-DUR) 10 MEQ tablet Take 10 mEq by mouth 2 (two) times daily.   Yes Historical Provider, MD  simethicone (MYLICON) 80 MG chewable tablet Chew 160 mg by mouth every 6 (six) hours as needed for flatulence.   Yes Historical Provider, MD  traZODone (DESYREL) 100 MG tablet Take 100 mg by mouth at bedtime.   Yes Historical Provider, MD  HYDROcodone-acetaminophen (NORCO/VICODIN) 5-325 MG per tablet Take 1 tablet by mouth every 6 (six) hours as needed for moderate pain. Patient not taking: Reported on 04/14/2014 05/25/13   Brent General, PA-C     Results for orders placed or  performed during the hospital encounter of 04/14/14 (from the past 48 hour(s))  Amylase     Status: Abnormal   Collection Time: 04/14/14  6:39 AM  Result Value Ref Range   Amylase 333 (H) 0 - 105 U/L  Lipase, blood     Status: None   Collection Time: 04/14/14  6:39 AM  Result Value Ref Range   Lipase 35 11 - 59 U/L  CBC WITH DIFFERENTIAL     Status: Abnormal   Collection Time: 04/14/14  6:39 AM  Result Value Ref Range   WBC 10.4 4.0 - 10.5 K/uL   RBC 3.73 (L) 3.87 - 5.11 MIL/uL   Hemoglobin 10.4 (L) 12.0 - 15.0 g/dL   HCT 32.2 (L) 36.0 - 46.0 %   MCV 86.3 78.0 - 100.0 fL   MCH 27.9 26.0 - 34.0 pg   MCHC 32.3 30.0 - 36.0 g/dL   RDW 14.2 11.5 - 15.5 %   Platelets 281 150 - 400 K/uL    Neutrophils Relative % 86 (H) 43 - 77 %   Neutro Abs 9.0 (H) 1.7 - 7.7 K/uL   Lymphocytes Relative 8 (L) 12 - 46 %   Lymphs Abs 0.8 0.7 - 4.0 K/uL   Monocytes Relative 6 3 - 12 %   Monocytes Absolute 0.6 0.1 - 1.0 K/uL   Eosinophils Relative 0 0 - 5 %   Eosinophils Absolute 0.0 0.0 - 0.7 K/uL   Basophils Relative 0 0 - 1 %   Basophils Absolute 0.0 0.0 - 0.1 K/uL  I-Stat Chem 8, ED     Status: Abnormal   Collection Time: 04/14/14  7:05 AM  Result Value Ref Range   Sodium 136 135 - 145 mmol/L   Potassium 3.1 (L) 3.5 - 5.1 mmol/L   Chloride 97 96 - 112 mmol/L   BUN 12 6 - 23 mg/dL   Creatinine, Ser 0.90 0.50 - 1.10 mg/dL   Glucose, Bld 109 (H) 70 - 99 mg/dL   Calcium, Ion 1.12 1.12 - 1.23 mmol/L   TCO2 24 0 - 100 mmol/L   Hemoglobin 12.2 12.0 - 15.0 g/dL   HCT 36.0 36.0 - 46.0 %  Urinalysis with microscopic     Status: Abnormal   Collection Time: 04/14/14  9:07 AM  Result Value Ref Range   Color, Urine YELLOW YELLOW   APPearance CLOUDY (A) CLEAR   Specific Gravity, Urine 1.016 1.005 - 1.030   pH 8.5 (H) 5.0 - 8.0   Glucose, UA NEGATIVE NEGATIVE mg/dL   Hgb urine dipstick NEGATIVE NEGATIVE   Bilirubin Urine NEGATIVE NEGATIVE   Ketones, ur NEGATIVE NEGATIVE mg/dL   Protein, ur 30 (A) NEGATIVE mg/dL   Urobilinogen, UA 0.2 0.0 - 1.0 mg/dL   Nitrite NEGATIVE NEGATIVE   Leukocytes, UA NEGATIVE NEGATIVE  Urine microscopic-add on     Status: None   Collection Time: 04/14/14  9:07 AM  Result Value Ref Range   RBC / HPF 0-2 <3 RBC/hpf   Ct Renal Stone Study  04/14/2014   CLINICAL DATA:  44 year old female with nausea and vomiting and right abdominal pain extending to right flank since 04/13/2014. Constipation. History of UTI. Initial encounter.  EXAM: CT ABDOMEN AND PELVIS WITHOUT CONTRAST  TECHNIQUE: Multidetector CT imaging of the abdomen and pelvis was performed following the standard protocol without IV contrast.  COMPARISON:  05/25/2013 and 07/07/2011 CT.  FINDINGS: No renal or  ureteral obstructing stone or evidence of hydronephrosis. Tiny nonobstructing right renal calculi. Scarring  of kidneys noted bilaterally.  Findings suspicious for appendicitis with appendix containing large appendicoliths. Inflammation with haziness of fat planes greatest in the right lower quadrant and lower pelvis without drainable abscess identified. The presence of adjacent fluid filled loops of small bowel slightly limits evaluation however, per discussion with Dr. Zenia Resides, appendicitis correlates clinically with patient's exam.  No free intraperitoneal air.  Large septated cystic structure posterior to the pancreatic body and tail in the splenic hilar region with extension into the left pericolic gutter reaching the level just below the left iliac wing. This has changed configuration slightly since 2013 but has an appearance most suggestive of lymphangioma as previously noted. This spans over 18 cm.  Taking into account limitation by non contrast imaging, no worrisome hepatic, splenic, pancreatic or adrenal lesion.  No abdominal aortic aneurysm.  No obvious adnexal abnormality or bladder abnormality.  No osseous abnormality.  Lung bases clear.  IMPRESSION: Findings suspicious for appendicitis as detailed above.  No renal or ureteral obstructing stone or evidence hydronephrosis. Tiny nonobstructing right renal calculi. Scarring of the kidneys.  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   Electronically Signed   By: Chauncey Cruel M.D.   On: 04/14/2014 09:04    Review of Systems  Constitutional: Positive for fever (99.3 at home) and chills. Negative for weight loss, malaise/fatigue and diaphoresis.  HENT: Negative.   Eyes: Negative.   Respiratory: Negative.   Cardiovascular: Positive for leg swelling (She has frequent leg swelling).  Gastrointestinal: Positive for nausea, vomiting, abdominal pain (pain is mostly on right side.), constipation (chronic) and blood in stool (with constipation and  hemorrhoids.). Negative for heartburn and diarrhea.  Genitourinary: Negative.   Musculoskeletal: Negative.   Skin: Negative.   Neurological: Negative.  Negative for weakness.  Endo/Heme/Allergies: Negative.   Psychiatric/Behavioral: Positive for depression (hx of depression/Bipolar and currently mania is main issue, she has not been able to sleep well for several weeks.  Just started on Trazadone.) and substance abuse (prior history, none for about 6 years.). Negative for suicidal ideas. The patient is nervous/anxious.     Blood pressure 125/86, pulse 60, temperature 98.5 F (36.9 C), temperature source Oral, resp. rate 18, height 5\' 6"  (1.676 m), weight 49.896 kg (110 lb), last menstrual period 04/11/2014, SpO2 100 %. Physical Exam  Constitutional: She is oriented to person, place, and time. No distress.  Thin frail 44 y/o female, having a good deal of discomfort right side.  HENT:  Head: Normocephalic and atraumatic.  Nose: Nose normal.  Eyes: Conjunctivae and EOM are normal. Pupils are equal, round, and reactive to light. Right eye exhibits discharge. Left eye exhibits no discharge. No scleral icterus.  Neck: Normal range of motion. Neck supple. No JVD present. No tracheal deviation present. No thyromegaly present.  Cardiovascular: Normal rate, regular rhythm, normal heart sounds and intact distal pulses.   No murmur heard. Respiratory: Effort normal and breath sounds normal. No respiratory distress. She has no wheezes. She has no rales. She exhibits no tenderness.  GI: Soft. Bowel sounds are normal. She exhibits no distension and no mass. There is tenderness (she is very tender right side, more in the RLQ than RUQ). There is no rebound and no guarding.  Hurts to sit up  Musculoskeletal: She exhibits no edema or tenderness.  Lymphadenopathy:    She has no cervical adenopathy.  Neurological: She is alert and oriented to person, place, and time. No cranial nerve deficit.  Skin: Skin is  warm and dry. No  rash noted. She is not diaphoretic. No erythema. No pallor.  Psychiatric: She has a normal mood and affect. Her behavior is normal. Judgment and thought content normal.     Assessment/Plan 1.  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  Hx of depression/OCD/PTSD/bIPOLAR 4.  Remote hx of substance abuse (tobacco/ETOH) 5.  Body mass index is 17.76 kg/(m^2)./malnourished.  Plan:  Antibiotics, hydrate and appendectomy today.  JENNINGS,WILLARD 04/14/2014, 10:11 AM   Seen, agree with above. Will need lap appy

## 2014-04-14 NOTE — Anesthesia Preprocedure Evaluation (Addendum)
Anesthesia Evaluation  Patient identified by MRN, date of birth, ID band Patient awake    Reviewed: Allergy & Precautions, H&P , NPO status , Patient's Chart, lab work & pertinent test results  Airway Mallampati: II  TM Distance: >3 FB Neck ROM: full    Dental no notable dental hx. (+) Teeth Intact, Dental Advisory Given   Pulmonary neg pulmonary ROS, former smoker,  breath sounds clear to auscultation  Pulmonary exam normal       Cardiovascular Exercise Tolerance: Good negative cardio ROS  Rhythm:regular Rate:Normal     Neuro/Psych Anxiety Depression Bipolar Disorder negative neurological ROS     GI/Hepatic negative GI ROS, Neg liver ROS,   Endo/Other  negative endocrine ROS  Renal/GU negative Renal ROS  negative genitourinary   Musculoskeletal   Abdominal   Peds  Hematology negative hematology ROS (+) anemia , hgb 10.4   Anesthesia Other Findings   Reproductive/Obstetrics negative OB ROS                            Anesthesia Physical Anesthesia Plan  ASA: II and emergent  Anesthesia Plan: General   Post-op Pain Management:    Induction: Intravenous, Rapid sequence and Cricoid pressure planned  Airway Management Planned: Oral ETT  Additional Equipment:   Intra-op Plan:   Post-operative Plan: Extubation in OR  Informed Consent: I have reviewed the patients History and Physical, chart, labs and discussed the procedure including the risks, benefits and alternatives for the proposed anesthesia with the patient or authorized representative who has indicated his/her understanding and acceptance.   Dental Advisory Given  Plan Discussed with: CRNA and Surgeon  Anesthesia Plan Comments:        Anesthesia Quick Evaluation

## 2014-04-14 NOTE — Transfer of Care (Signed)
Immediate Anesthesia Transfer of Care Note  Patient: Cassandra Cunningham  Procedure(s) Performed: Procedure(s): APPENDECTOMY LAPAROSCOPIC (N/A)  Patient Location: PACU  Anesthesia Type:General  Level of Consciousness: awake, alert , oriented and patient cooperative  Airway & Oxygen Therapy: Patient Spontanous Breathing and Patient connected to face mask oxygen  Post-op Assessment: Report given to PACU RN and Post -op Vital signs reviewed and stable  Post vital signs: Reviewed and stable  Complications: No apparent anesthesia complications

## 2014-04-15 ENCOUNTER — Encounter (HOSPITAL_COMMUNITY): Payer: Self-pay | Admitting: General Surgery

## 2014-04-15 LAB — BASIC METABOLIC PANEL
Anion gap: 9 (ref 5–15)
BUN: 8 mg/dL (ref 6–23)
CO2: 24 mmol/L (ref 19–32)
Calcium: 9.1 mg/dL (ref 8.4–10.5)
Chloride: 101 mmol/L (ref 96–112)
Creatinine, Ser: 0.99 mg/dL (ref 0.50–1.10)
GFR calc Af Amer: 81 mL/min — ABNORMAL LOW (ref >90)
GFR calc non Af Amer: 70 mL/min — ABNORMAL LOW (ref >90)
Glucose, Bld: 126 mg/dL — ABNORMAL HIGH (ref 70–99)
Potassium: 4.4 mmol/L (ref 3.5–5.1)
Sodium: 134 mmol/L — ABNORMAL LOW (ref 135–145)

## 2014-04-15 LAB — CBC
HCT: 30.8 % — ABNORMAL LOW (ref 36.0–46.0)
Hemoglobin: 9.5 g/dL — ABNORMAL LOW (ref 12.0–15.0)
MCH: 26.9 pg (ref 26.0–34.0)
MCHC: 30.8 g/dL (ref 30.0–36.0)
MCV: 87.3 fL (ref 78.0–100.0)
Platelets: 234 10*3/uL (ref 150–400)
RBC: 3.53 MIL/uL — ABNORMAL LOW (ref 3.87–5.11)
RDW: 14.8 % (ref 11.5–15.5)
WBC: 12.7 10*3/uL — ABNORMAL HIGH (ref 4.0–10.5)

## 2014-04-15 MED ORDER — POLYETHYLENE GLYCOL 3350 17 GM/SCOOP PO POWD: 8.5000 g | Freq: Two times a day (BID) | ORAL | Status: DC

## 2014-04-15 MED ORDER — DIPHENHYDRAMINE HCL 25 MG PO CAPS: 25.0000 mg | ORAL_CAPSULE | Freq: Four times a day (QID) | ORAL | Status: DC | PRN

## 2014-04-15 MED ORDER — OXYCODONE-ACETAMINOPHEN 5-325 MG PO TABS: 1.0000 | ORAL_TABLET | Freq: Four times a day (QID) | ORAL | Status: DC | PRN

## 2014-04-15 NOTE — Care Management Note (Addendum)
    Page 1 of 1   04/15/2014     1:50:21 PM CARE MANAGEMENT NOTE 04/15/2014  Patient:  Cassandra Cunningham, Cassandra Cunningham   Account Number:  [id number]  Date Initiated:  04/15/2014  Documentation initiated by:  Dessa Phi  Subjective/Objective Assessment:   44 y/o f admitted w/Acute appendicitis.     Action/Plan:   From home.   Anticipated DC Date:  04/15/2014   Anticipated DC Plan:  [place]  CM consult      Choice offered to / List presented to:             Status of service:  Completed, signed off Medicare Important Message given?   (If response is "NO", the following Medicare IM given date fields will be blank) Date Medicare IM given:   Medicare IM given by:   Date Additional Medicare IM given:   Additional Medicare IM given by:    Discharge Disposition:  HOME/SELF CARE  Per UR Regulation:  Reviewed for med. necessity/level of care/duration of stay  If discussed at [place] of Stay Meetings, dates discussed:    Comments:  04/15/14 Dessa Phi RN BSn NCM [phone] Patient notified of obsv status,HAR note placed,ccs- notified. POD#1 lap appy.No anticipated d/c needs.

## 2014-04-15 NOTE — Discharge Instructions (Signed)
Your appointment is at 2:15pm, please arrive at least 30 min before your appointment to complete your check in paperwork.  If you are unable to arrive 30 min prior to your appointment time we may have to cancel or reschedule you.  LAPAROSCOPIC SURGERY: POST OP INSTRUCTIONS  1. DIET: Follow a light bland diet the first 24 hours after arrival home, such as soup, liquids, crackers, etc. Be sure to include lots of fluids daily. Avoid fast food or heavy meals as your are more likely to get nauseated. Eat a low fat the next few days after surgery.  2. Take your usually prescribed home medications unless otherwise directed. 3. PAIN CONTROL:  1. Pain is best controlled by a usual combination of three different methods TOGETHER:  1. Ice/Heat 2. Over the counter pain medication 3. Prescription pain medication 2. Most patients will experience some swelling and bruising around the incisions. Ice packs or heating pads (30-60 minutes up to 6 times a day) will help. Use ice for the first few days to help decrease swelling and bruising, then switch to heat to help relax tight/sore spots and speed recovery. Some people prefer to use ice alone, heat alone, alternating between ice & heat. Experiment to what works for you. Swelling and bruising can take several weeks to resolve.  3. It is helpful to take an over-the-counter pain medication regularly for the first few weeks. Choose one of the following that works best for you:  1. Naproxen (Aleve, etc) Two 220mg  tabs twice a day 2. Ibuprofen (Advil, etc) Three 200mg  tabs four times a day (every meal & bedtime) 3. Acetaminophen (Tylenol, etc) 500-650mg  four times a day (every meal & bedtime) 4. A prescription for pain medication (such as oxycodone, hydrocodone, etc) should be given to you upon discharge. Take your pain medication as prescribed.  1. If you are having problems/concerns with the prescription medicine (does not control pain, nausea, vomiting, rash, itching,  etc), please call us [phone] to see if we need to switch you to a different pain medicine that will work better for you and/or control your side effect better. 2. If you need a refill on your pain medication, please contact your pharmacy. They will contact our office to request authorization. Prescriptions will not be filled after 5 pm or on week-ends. 4. Avoid getting constipated. Between the surgery and the pain medications, it is common to experience some constipation. Increasing fluid intake and taking a fiber supplement (such as Metamucil, Citrucel, FiberCon, MiraLax, etc) 1-2 times a day regularly will usually help prevent this problem from occurring. A mild laxative (prune juice, Milk of Magnesia, MiraLax, etc) should be taken according to package directions if there are no bowel movements after 48 hours.  5. Watch out for diarrhea. If you have many loose bowel movements, simplify your diet to bland foods & liquids for a few days. Stop any stool softeners and decrease your fiber supplement. Switching to mild anti-diarrheal medications (Kayopectate, Pepto Bismol) can help. If this worsens or does not improve, please call us. 6. Wash / shower every day. You may shower over the dressings as they are waterproof. Continue to shower over incision(s) after the dressing is off. 7. Remove your waterproof bandages 5 days after surgery. You may leave the incision open to air. You may replace a dressing/Band-Aid to cover the incision for comfort if you wish.  8. ACTIVITIES as tolerated:  1. You may resume regular (light) daily activities beginning the next day--such as  walking, climbing stairs--gradually increasing activities as tolerated. If you can walk 30 minutes without difficulty, it is safe to try more intense activity such as jogging, treadmill, bicycling, low-impact aerobics, swimming, etc. °2. Save the most intensive and strenuous activity for last such as sit-ups, heavy lifting,  contact sports, etc Refrain from any heavy lifting or straining until you are off narcotics for pain control.  °3. DO NOT PUSH THROUGH PAIN. Let pain be your guide: If it hurts to do something, don't do it. Pain is your body warning you to avoid that activity for another week until the pain goes down. °4. You may drive when you are no longer taking prescription pain medication, you can comfortably wear a seatbelt, and you can safely maneuver your car and apply brakes. °5. You may have sexual intercourse when it is comfortable.  °9. FOLLOW UP in our office  °1. Please call [facility] at [phone] to set up an appointment to see your surgeon in the office for a follow-up appointment approximately 2-3 weeks after your surgery. °2. Make sure that you call for this appointment the day you arrive home to insure a convenient appointment time. °     10. IF YOU HAVE DISABILITY OR FAMILY LEAVE FORMS, BRING THEM TO THE               OFFICE FOR PROCESSING.  ° °WHEN TO CALL US [phone]:  °1. Poor pain control °2. Reactions / problems with new medications (rash/itching, nausea, etc)  °3. Fever over 101.5 F (38.5 C) °4. Inability to urinate °5. Nausea and/or vomiting °6. Worsening swelling or bruising °7. Continued bleeding from incision. °8. Increased pain, redness, or drainage from the incision ° °The clinic staff is available to answer your questions during regular business hours (8:30am-5pm). Please don’t hesitate to call and ask to speak to one of our nurses for clinical concerns.  °If you have a medical emergency, go to the nearest emergency room or call 911.  °A surgeon from [facility] is always on call at the hospitals  ° °[facility]  °[address], [address], [place], [place] [address] ?  °MAIN: [phone] ? TOLL FREE: [phone] ?  °FAX [phone]  °[email or web address] ° °

## 2014-04-15 NOTE — Progress Notes (Signed)
1 Day Post-Op  Subjective: Pt is alert and awake sitting up in bed in no distress. Tolerating liquids and just ordered regular diet breakfast. States her pain has been minimal around 3/10. Nauseous last night, but no n/v today. Has been burping with a little flatus. Complains of new RUQ pain and "stones that feel like they move around" and asked for an ultrasound.  Objective: Vital signs in last 24 hours: Temp:  [97.4 F (36.3 C)-99.3 F (37.4 C)] 97.6 F (36.4 C) (01/27 0606) Pulse Rate:  [49-62] 49 (01/26 2213) Resp:  [11-20] 16 (01/27 0606) BP: (89-107)/(49-65) 107/65 mmHg (01/27 0606) SpO2:  [100 %] 100 % (01/27 0606)    Intake/Output from previous day: 01/26 0701 - 01/27 0700 In: 1100 [I.V.:1100] Out: 300 [Urine:250; Blood:50] Intake/Output this shift:    General appearance: alert, cooperative and no distress Resp: breathing comfortably GI: nontender, nondistended, incisions are c/d/i with no signs of infection.  Lab Results:   Recent Labs  04/14/14 0639 04/14/14 0705 04/15/14 0545  WBC 10.4  --  12.7*  HGB 10.4* 12.2 9.5*  HCT 32.2* 36.0 30.8*  PLT 281  --  234   BMET  Recent Labs  04/14/14 0705 04/15/14 0545  NA 136 134*  K 3.1* 4.4  CL 97 101  CO2  --  24  GLUCOSE 109* 126*  BUN 12 8  CREATININE 0.90 0.99  CALCIUM  --  9.1   PT/INR No results for input(s): LABPROT, INR in the last 72 hours. ABG No results for input(s): PHART, HCO3 in the last 72 hours.  Invalid input(s): PCO2, PO2  Studies/Results: Ct Renal Stone Study  04/14/2014   CLINICAL DATA:  44 year old female with nausea and vomiting and right abdominal pain extending to right flank since 04/13/2014. Constipation. History of UTI. Initial encounter.  EXAM: CT ABDOMEN AND PELVIS WITHOUT CONTRAST  TECHNIQUE: Multidetector CT imaging of the abdomen and pelvis was performed following the standard protocol without IV contrast.  COMPARISON:  05/25/2013 and 07/07/2011 CT.  FINDINGS: No renal or  ureteral obstructing stone or evidence of hydronephrosis. Tiny nonobstructing right renal calculi. Scarring of kidneys noted bilaterally.  Findings suspicious for appendicitis with appendix containing large appendicoliths. Inflammation with haziness of fat planes greatest in the right lower quadrant and lower pelvis without drainable abscess identified. The presence of adjacent fluid filled loops of small bowel slightly limits evaluation however, per discussion with Dr. Zenia Resides, appendicitis correlates clinically with patient's exam.  No free intraperitoneal air.  Large septated cystic structure posterior to the pancreatic body and tail in the splenic hilar region with extension into the left pericolic gutter reaching the level just below the left iliac wing. This has changed configuration slightly since 2013 but has an appearance most suggestive of lymphangioma as previously noted. This spans over 18 cm.  Taking into account limitation by non contrast imaging, no worrisome hepatic, splenic, pancreatic or adrenal lesion.  No abdominal aortic aneurysm.  No obvious adnexal abnormality or bladder abnormality.  No osseous abnormality.  Lung bases clear.  IMPRESSION: Findings suspicious for appendicitis as detailed above.  No renal or ureteral obstructing stone or evidence hydronephrosis. Tiny nonobstructing right renal calculi. Scarring of the kidneys.  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   Electronically Signed   By: Chauncey Cruel M.D.   On: 04/14/2014 09:04    Anti-infectives: Anti-infectives    Start     Dose/Rate Route Frequency Ordered Stop   04/14/14 1200  cefOXitin (MEFOXIN) 1 g  in dextrose 5 % 50 mL IVPB     1 g100 mL/hr over 30 Minutes Intravenous 4 times per day 04/14/14 1043        Assessment/Plan: s/p Procedure(s): APPENDECTOMY LAPAROSCOPIC (N/A)  Discharge today    LOS: 1 day    Cassandra Cunningham 04/15/2014

## 2014-04-15 NOTE — Discharge Summary (Signed)
[facility] Discharge Summary   Patient ID: Cassandra Cunningham MRN: [id number] DOB/AGE: [birth year] 44 y.o.  Admit date: 04/14/2014 Discharge date: 04/15/2014  Admitting Diagnosis: Acute appendicitis  Discharge Diagnosis Patient Active Problem List   Diagnosis Date Noted  . Acute appendicitis 04/14/2014  . MDD (major depressive disorder) 12/25/2012  . Suicidal ideation 12/25/2012  . Lumbar strain 12/25/2012  . Levator spasm 11/07/2012  . Internal and external bleeding hemorrhoids 05/07/2012  . Anorexia nervosa 09/13/2011  . 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 MANIC DEPRESSIVE ILLNESS 09/06/2006    Consultants None  Imaging: Ct Renal Stone Study  04/14/2014   CLINICAL DATA:  44 year old female with nausea and vomiting and right abdominal pain extending to right flank since 04/13/2014. Constipation. History of UTI. Initial encounter.  EXAM: CT ABDOMEN AND PELVIS WITHOUT CONTRAST  TECHNIQUE: Multidetector CT imaging of the abdomen and pelvis was performed following the standard protocol without IV contrast.  COMPARISON:  05/25/2013 and 07/07/2011 CT.  FINDINGS: No renal or ureteral obstructing stone or evidence of hydronephrosis. Tiny nonobstructing right renal calculi. Scarring of kidneys noted bilaterally.  Findings suspicious for appendicitis with appendix containing large appendicoliths. Inflammation with haziness of fat planes greatest in the right lower quadrant and lower pelvis without drainable abscess identified. The presence of adjacent fluid filled loops of small bowel slightly limits evaluation however, per discussion with Dr. Zenia Resides, appendicitis correlates clinically with patient's exam.  No free intraperitoneal air.  Large septated cystic structure posterior to the pancreatic body and tail in the splenic hilar region with extension into the left pericolic gutter reaching the level just below the left iliac wing. This has changed configuration  slightly since 2013 but has an appearance most suggestive of lymphangioma as previously noted. This spans over 18 cm.  Taking into account limitation by non contrast imaging, no worrisome hepatic, splenic, pancreatic or adrenal lesion.  No abdominal aortic aneurysm.  No obvious adnexal abnormality or bladder abnormality.  No osseous abnormality.  Lung bases clear.  IMPRESSION: Findings suspicious for appendicitis as detailed above.  No renal or ureteral obstructing stone or evidence hydronephrosis. Tiny nonobstructing right renal calculi. Scarring of the kidneys.  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   Electronically Signed   By: Chauncey Cruel M.D.   On: 04/14/2014 09:04    Procedures Dr. Barry Dienes (04/15/14) - Laparoscopic Appendectomy  Hospital Course:  44 y/o female started having some pain right side yesterday around 2 PM. She was worried it might be kidney stones or a UTI, but it didn't feel like that. She was able to eat and drink last PM, and last ate about 8 pm yesterday. She awoke with more pain about MN-1AM. She had nausea and vomiting with it.  It has not improved. Work up in the ED. UA shows some protein, K+ 3.1, amalyase 333, lipase 35. WBC 10.4 with left shift. Renal CT protocol shows no renal stone, but appendix containing large appendicoliths. Inflammation with haziness of fat planes greatest in the right lower quadrant and lower pelvis without drainable abscess identified. The presence of adjacent fluid filled loops of small bowel. It looks very much like appendicitis. She also has a Large septated cystic structure posterior to the pancreatic body and tail in the splenic hilar region with extension into the left pericolic gutter reaching the level just below the left iliac wing. This has changed configuration slightly since 2013 but has an appearance most suggestive of lymphangioma as previously noted. This spans over 18  cm.   Patient was admitted and underwent procedure listed  above.  Tolerated procedure well and was transferred to the floor.  Diet was advanced as tolerated.  On POD #1, the patient was voiding well, tolerating diet, ambulating well, pain well controlled, vital signs stable, incisions c/d/i and felt stable for discharge home.  Patient will follow up in our office in 3 weeks and knows to call with questions or concerns.  She will need to follow up regarding her lymphangioma and can discuss this with Dr. Barry Dienes at that time.        Medication List    STOP taking these medications        HYDROcodone-acetaminophen 5-325 MG per tablet  Commonly known as:  NORCO/VICODIN      TAKE these medications        b complex vitamins capsule  Take 1 capsule by mouth every morning.     diphenhydrAMINE 25 mg capsule  Commonly known as:  BENADRYL  Take 1 capsule (25 mg total) by mouth every 6 (six) hours as needed for itching, allergies or sleep.     multivitamin with minerals Tabs tablet  Take 1 tablet by mouth daily.     oxyCODONE-acetaminophen 5-325 MG per tablet  Commonly known as:  PERCOCET/ROXICET  Take 1-2 tablets by mouth every 6 (six) hours as needed for moderate pain.     polyethylene glycol powder powder  Commonly known as:  MIRALAX  Take 8.5-34 g by mouth 2 (two) times daily. To correct constipation.  Adjust dose over 1-2 months.  Goal = ~1 bowel movement / day     potassium chloride 10 MEQ tablet  Commonly known as:  K-DUR  Take 10 mEq by mouth 2 (two) times daily.     simethicone 80 MG chewable tablet  Commonly known as:  MYLICON  Chew 694 mg by mouth every 6 (six) hours as needed for flatulence.     traZODone 100 MG tablet  Commonly known as:  DESYREL  Take 100 mg by mouth at bedtime.         Follow-up Information    Follow up with [facility], [place] In 3 weeks.   Specialty:  General Surgery   Why:  For post-operation check and follow up about your lymph tumor in your abdomen   Contact information:   [place] [address] [phone]       Signed: [facility] [phone]  04/15/2014, 1:00 PM

## 2014-04-20 HISTORY — PX: TUMOR REMOVAL: SHX12

## 2014-05-05 ENCOUNTER — Ambulatory Visit (INDEPENDENT_AMBULATORY_CARE_PROVIDER_SITE_OTHER): Payer: Medicare Other | Admitting: Psychiatry

## 2014-05-05 ENCOUNTER — Other Ambulatory Visit (INDEPENDENT_AMBULATORY_CARE_PROVIDER_SITE_OTHER): Payer: Self-pay | Admitting: General Surgery

## 2014-05-05 ENCOUNTER — Encounter (HOSPITAL_COMMUNITY): Payer: Self-pay | Admitting: Psychiatry

## 2014-05-05 VITALS — BP 100/60 | HR 53 | Ht 66.5 in | Wt 113.6 lb

## 2014-05-05 DIAGNOSIS — Z0000 Encounter for general adult medical examination without abnormal findings: Secondary | ICD-10-CM | POA: Insufficient documentation

## 2014-05-05 DIAGNOSIS — R1902 Left upper quadrant abdominal swelling, mass and lump: Secondary | ICD-10-CM

## 2014-05-05 DIAGNOSIS — F502 Bulimia nervosa: Secondary | ICD-10-CM | POA: Insufficient documentation

## 2014-05-05 DIAGNOSIS — F1021 Alcohol dependence, in remission: Secondary | ICD-10-CM | POA: Insufficient documentation

## 2014-05-05 DIAGNOSIS — F411 Generalized anxiety disorder: Secondary | ICD-10-CM

## 2014-05-05 DIAGNOSIS — F250 Schizoaffective disorder, bipolar type: Secondary | ICD-10-CM

## 2014-05-05 MED ORDER — RISPERIDONE 1 MG PO TABS: 1.0000 mg | ORAL_TABLET | Freq: Two times a day (BID) | ORAL | Status: DC

## 2014-05-05 NOTE — Addendum Note (Signed)
Addended by: Stark Klein on: 05/05/2014 03:11 PM   Modules accepted: Orders
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
## 2014-05-06 ENCOUNTER — Ambulatory Visit (HOSPITAL_COMMUNITY)
Admission: RE | Admit: 2014-05-06 | Discharge: 2014-05-06 | Disposition: A | Discharge status: Home / Self Care | Payer: Medicaid Other | Source: Ambulatory Visit | Attending: Psychiatry | Admitting: Psychiatry

## 2014-05-06 DIAGNOSIS — Z0000 Encounter for general adult medical examination without abnormal findings: Secondary | ICD-10-CM | POA: Insufficient documentation

## 2014-05-08 ENCOUNTER — Other Ambulatory Visit (INDEPENDENT_AMBULATORY_CARE_PROVIDER_SITE_OTHER): Payer: Self-pay | Admitting: General Surgery

## 2014-05-08 DIAGNOSIS — R1902 Left upper quadrant abdominal swelling, mass and lump: Secondary | ICD-10-CM

## 2014-05-15 ENCOUNTER — Ambulatory Visit
Admission: RE | Admit: 2014-05-15 | Discharge: 2014-05-15 | Disposition: A | Discharge status: Home / Self Care | Payer: Medicare Other | Source: Ambulatory Visit | Attending: General Surgery | Admitting: General Surgery

## 2014-05-15 MED ORDER — IOHEXOL 300 MG/ML  SOLN
100.0000 mL | Freq: Once | INTRAMUSCULAR | Status: AC | PRN
  Administered 2014-05-15: 100 mL via INTRAVENOUS

## 2014-05-29 ENCOUNTER — Other Ambulatory Visit (INDEPENDENT_AMBULATORY_CARE_PROVIDER_SITE_OTHER): Payer: Self-pay | Admitting: General Surgery

## 2014-05-29 NOTE — Progress Notes (Signed)
Please put orders in Epic surgery 06-10-14 pre op 06-05-14 Thanks

## 2014-06-01 ENCOUNTER — Telehealth (HOSPITAL_COMMUNITY): Payer: Self-pay | Admitting: *Deleted

## 2014-06-01 DIAGNOSIS — F250 Schizoaffective disorder, bipolar type: Secondary | ICD-10-CM

## 2014-06-01 NOTE — Telephone Encounter (Signed)
Dr. Doyne Keel,  Patient called today.  Patient stated that she stopped her Risperidone and Trazodone one week after taking medication(05-12-2014). Patient stated that she stopped the medication because it was making her fat.  Patient stated that she was scared to call but her daughter made her because she needs another type of medication that will not cause her to get fat.  Please advise.  *Patient aware that you will be in the office Tuesday to respond to call.

## 2014-06-02 MED ORDER — ZIPRASIDONE HCL 20 MG PO CAPS: ORAL_CAPSULE | ORAL | Status: DC

## 2014-06-02 NOTE — Telephone Encounter (Signed)
Pt states Trazodone and Risperdal caused her gain 10 lbs in 1.5 weeks and was causing stomach issues. Pt has been off for a couple of weeks. Mood was good with meds but now mood has declined. Pt doesn't want to take meds that cause weight gain.  A/P:   Schiozoaffective disorder- Bipolar type current episode unspecified, GAD, Bulemia, alcohol dependence in remission 1. D/c Trazodone due to SE 2. D/c Risperdal due to weight gain 3. Start trial of Geodon 20mg  po qD for 5 days then increase to 20mg  BID for mood lability

## 2014-06-04 NOTE — Patient Instructions (Addendum)
[place]  06/04/2014   Your procedure is scheduled on:   06-10-2014 Wednesday  Enter through [facility]  Entrance and follow signs to [facility]. Arrive at      1000  AM .  Call this number if you have problems the morning of surgery: [phone]  Or Presurgical Testing [phone].   For Living Will and/or Health Care Power Attorney Forms: please provide copy for your medical record,may bring AM of surgery(Forms should be already notarized -we do not provide this service).(06-05-14  No, information provided  today as requested).  Remember: Follow any bowel prep instructions per MD office.(Fleet Enema once night before surgery).    Do not eat food/ or drink: After Midnight.  Exception: may have clear liquids:up to 6 Hours before arrival. Nothing after: 0600 AM.  Clear liquids include soda, tea, black coffee, apple or grape juice, broth.  Take these medicines the morning of surgery with A SIP OF WATER: Ziprasidone.   Do not wear jewelry, make-up or nail polish.  Do not wear deodorant, lotions, powders, or perfumes.   Do not shave legs and under arms- 48 hours(2 days) prior to first CHG shower.(Shaving face and neck okay.)  Do not bring valuables to the hospital.(Hospital is not responsible for lost valuables).  Contacts, dentures or removable bridgework, body piercing, hair pins may not be worn into surgery.  Leave suitcase in the car. After surgery it may be brought to your room.  For patients admitted to the hospital, checkout time is 11:00 AM the day of discharge.(Restricted visitors-Any Persons displaying flu-like symptoms or illness).    Patients discharged the day of surgery will not be allowed to drive home. Must have responsible person with you x 24 hours once discharged.  Name and phone number of your driver: sister - Cassandra Cunningham -[phone] cell     Please read over the following fact sheets that you were given:  CHG(Chlorhexidine Gluconate 4% Surgical  Soap) use .  Remember : Type/Screen "Blue armbands" - may not be removed once applied(would result in being retested AM of surgery, if removed).         [facility] - Preparing for Surgery Before surgery, you can play an important role.  Because skin is not sterile, your skin needs to be as free of germs as possible.  You can reduce the number of germs on your skin by washing with CHG (chlorahexidine gluconate) soap before surgery.  CHG is an antiseptic cleaner which kills germs and bonds with the skin to continue killing germs even after washing. Please DO NOT use if you have an allergy to CHG or antibacterial soaps.  If your skin becomes reddened/irritated stop using the CHG and inform your nurse when you arrive at Short Stay. Do not shave (including legs and underarms) for at least 48 hours prior to the first CHG shower.  You may shave your face/neck. Please follow these instructions carefully:  1.  Shower with CHG Soap the night before surgery and the  morning of Surgery.  2.  If you choose to wash your hair, wash your hair first as usual with your  normal  shampoo.  3.  After you shampoo, rinse your hair and body thoroughly to remove the  shampoo.                           4.  Use CHG as you would any other liquid soap.  You can apply chg directly  to the skin and wash                       Gently with a scrungie or clean washcloth.  5.  Apply the CHG Soap to your body ONLY FROM THE NECK DOWN.   Do not use on face/ open                           Wound or open sores. Avoid contact with eyes, ears mouth and genitals (private parts).                       Wash face,  Genitals (private parts) with your normal soap.             6.  Wash thoroughly, paying special attention to the area where your surgery  will be performed.  7.  Thoroughly rinse your body with warm water from the neck down.  8.  DO NOT shower/wash with your normal soap after using and rinsing off  the CHG Soap.                9.   Pat yourself dry with a clean towel.            10.  Wear clean pajamas.            11.  Place clean sheets on your bed the night of your first shower and do not  sleep with pets. Day of Surgery : Do not apply any lotions/deodorants the morning of surgery.  Please wear clean clothes to the hospital/surgery center.  FAILURE TO FOLLOW THESE INSTRUCTIONS MAY RESULT IN THE CANCELLATION OF YOUR SURGERY PATIENT SIGNATURE_________________________________  NURSE SIGNATURE__________________________________  ________________________________________________________________________

## 2014-06-05 ENCOUNTER — Encounter (HOSPITAL_COMMUNITY)
Admission: RE | Admit: 2014-06-05 | Discharge: 2014-06-05 | Disposition: A | Discharge status: Home / Self Care | Payer: Medicare Other | Source: Ambulatory Visit | Attending: General Surgery | Admitting: General Surgery

## 2014-06-05 ENCOUNTER — Encounter (HOSPITAL_COMMUNITY): Payer: Self-pay

## 2014-06-05 DIAGNOSIS — Z01812 Encounter for preprocedural laboratory examination: Secondary | ICD-10-CM | POA: Insufficient documentation

## 2014-06-05 HISTORY — DX: Bradycardia, unspecified: R00.1

## 2014-06-05 LAB — URINALYSIS, ROUTINE W REFLEX MICROSCOPIC
Bilirubin Urine: NEGATIVE
Glucose, UA: NEGATIVE mg/dL
Hgb urine dipstick: NEGATIVE
Ketones, ur: NEGATIVE mg/dL
Leukocytes, UA: NEGATIVE
Nitrite: NEGATIVE
Protein, ur: NEGATIVE mg/dL
Specific Gravity, Urine: 1.007 (ref 1.005–1.030)
Urobilinogen, UA: 0.2 mg/dL (ref 0.0–1.0)
pH: 7.5 (ref 5.0–8.0)

## 2014-06-05 LAB — CBC WITH DIFFERENTIAL/PLATELET
Basophils Absolute: 0 10*3/uL (ref 0.0–0.1)
Basophils Relative: 1 % (ref 0–1)
Eosinophils Absolute: 0.1 10*3/uL (ref 0.0–0.7)
Eosinophils Relative: 1 % (ref 0–5)
HCT: 35.7 % — ABNORMAL LOW (ref 36.0–46.0)
Hemoglobin: 10.9 g/dL — ABNORMAL LOW (ref 12.0–15.0)
Lymphocytes Relative: 25 % (ref 12–46)
Lymphs Abs: 1.2 10*3/uL (ref 0.7–4.0)
MCH: 25.6 pg — ABNORMAL LOW (ref 26.0–34.0)
MCHC: 30.5 g/dL (ref 30.0–36.0)
MCV: 84 fL (ref 78.0–100.0)
Monocytes Absolute: 0.4 10*3/uL (ref 0.1–1.0)
Monocytes Relative: 8 % (ref 3–12)
Neutro Abs: 3.2 10*3/uL (ref 1.7–7.7)
Neutrophils Relative %: 65 % (ref 43–77)
Platelets: 272 10*3/uL (ref 150–400)
RBC: 4.25 MIL/uL (ref 3.87–5.11)
RDW: 13.8 % (ref 11.5–15.5)
WBC: 4.9 10*3/uL (ref 4.0–10.5)

## 2014-06-05 LAB — BASIC METABOLIC PANEL
Anion gap: 13 (ref 5–15)
BUN: 14 mg/dL (ref 6–23)
CO2: 32 mmol/L (ref 19–32)
Calcium: 9.6 mg/dL (ref 8.4–10.5)
Chloride: 92 mmol/L — ABNORMAL LOW (ref 96–112)
Creatinine, Ser: 0.75 mg/dL (ref 0.50–1.10)
GFR calc Af Amer: >90 mL/min (ref >90)
GFR calc non Af Amer: >90 mL/min (ref >90)
Glucose, Bld: 88 mg/dL (ref 70–99)
Potassium: 3.1 mmol/L — ABNORMAL LOW (ref 3.5–5.1)
Sodium: 137 mmol/L (ref 135–145)

## 2014-06-05 LAB — HCG, SERUM, QUALITATIVE: Preg, Serum: NEGATIVE

## 2014-06-05 LAB — PROTIME-INR
INR: 0.93 (ref 0.00–1.49)
Prothrombin Time: 12.6 seconds (ref 11.6–15.2)

## 2014-06-05 LAB — ABO/RH: ABO/RH(D): B POS

## 2014-06-05 NOTE — Progress Notes (Signed)
06-05-14 1525 Labs viewable in Epic-note BMP -Potassium 3.1.

## 2014-06-05 NOTE — Pre-Procedure Instructions (Signed)
06-05-14 EKG 2'16. CT abd/pelvis 2'16 Epic.

## 2014-06-09 ENCOUNTER — Telehealth (HOSPITAL_COMMUNITY): Payer: Self-pay

## 2014-06-09 NOTE — Telephone Encounter (Signed)
Called and left voice message for call back to the clinic.

## 2014-06-10 ENCOUNTER — Ambulatory Visit (HOSPITAL_COMMUNITY): Payer: Medicare Other | Admitting: Anesthesiology

## 2014-06-10 ENCOUNTER — Encounter (HOSPITAL_COMMUNITY): Payer: Self-pay | Admitting: *Deleted

## 2014-06-10 ENCOUNTER — Encounter (HOSPITAL_COMMUNITY)
Admission: RE | Disposition: A | Discharge status: Home / Self Care | Payer: Self-pay | Source: Ambulatory Visit | Attending: General Surgery

## 2014-06-10 ENCOUNTER — Inpatient Hospital Stay (HOSPITAL_COMMUNITY)
Admission: RE | Admit: 2014-06-10 | Discharge: 2014-06-16 | DRG: 803 | Disposition: A | Discharge status: Home / Self Care | Payer: Medicare Other | Source: Ambulatory Visit | Attending: General Surgery | Admitting: General Surgery

## 2014-06-10 DIAGNOSIS — Z01812 Encounter for preprocedural laboratory examination: Secondary | ICD-10-CM | POA: Diagnosis not present

## 2014-06-10 DIAGNOSIS — Z833 Family history of diabetes mellitus: Secondary | ICD-10-CM | POA: Diagnosis not present

## 2014-06-10 DIAGNOSIS — F250 Schizoaffective disorder, bipolar type: Secondary | ICD-10-CM | POA: Diagnosis present

## 2014-06-10 DIAGNOSIS — F411 Generalized anxiety disorder: Secondary | ICD-10-CM | POA: Diagnosis present

## 2014-06-10 DIAGNOSIS — Z808 Family history of malignant neoplasm of other organs or systems: Secondary | ICD-10-CM | POA: Diagnosis not present

## 2014-06-10 DIAGNOSIS — Z8041 Family history of malignant neoplasm of ovary: Secondary | ICD-10-CM | POA: Diagnosis not present

## 2014-06-10 DIAGNOSIS — R1900 Intra-abdominal and pelvic swelling, mass and lump, unspecified site: Secondary | ICD-10-CM | POA: Diagnosis present

## 2014-06-10 DIAGNOSIS — D181 Lymphangioma, any site: Secondary | ICD-10-CM | POA: Diagnosis present

## 2014-06-10 DIAGNOSIS — Z8249 Family history of ischemic heart disease and other diseases of the circulatory system: Secondary | ICD-10-CM | POA: Diagnosis not present

## 2014-06-10 DIAGNOSIS — F1021 Alcohol dependence, in remission: Secondary | ICD-10-CM

## 2014-06-10 DIAGNOSIS — Z811 Family history of alcohol abuse and dependence: Secondary | ICD-10-CM

## 2014-06-10 DIAGNOSIS — K5900 Constipation, unspecified: Secondary | ICD-10-CM | POA: Diagnosis present

## 2014-06-10 DIAGNOSIS — Z86010 Personal history of colonic polyps: Secondary | ICD-10-CM

## 2014-06-10 DIAGNOSIS — Z87891 Personal history of nicotine dependence: Secondary | ICD-10-CM

## 2014-06-10 DIAGNOSIS — D62 Acute posthemorrhagic anemia: Secondary | ICD-10-CM | POA: Diagnosis not present

## 2014-06-10 DIAGNOSIS — F502 Bulimia nervosa: Secondary | ICD-10-CM | POA: Diagnosis present

## 2014-06-10 HISTORY — DX: Major depressive disorder, single episode, unspecified: F32.9

## 2014-06-10 HISTORY — DX: Unspecified acute appendicitis: K35.80

## 2014-06-10 HISTORY — PX: INCISION AND DRAINAGE OF WOUND: SHX1803

## 2014-06-10 LAB — CREATININE, SERUM
Creatinine, Ser: 0.8 mg/dL (ref 0.50–1.10)
GFR calc Af Amer: >90 mL/min (ref >90)
GFR calc non Af Amer: 90 mL/min — ABNORMAL LOW (ref >90)

## 2014-06-10 LAB — CBC
HCT: 34.2 % — ABNORMAL LOW (ref 36.0–46.0)
Hemoglobin: 10.8 g/dL — ABNORMAL LOW (ref 12.0–15.0)
MCH: 26.3 pg (ref 26.0–34.0)
MCHC: 31.6 g/dL (ref 30.0–36.0)
MCV: 83.2 fL (ref 78.0–100.0)
Platelets: 228 10*3/uL (ref 150–400)
RBC: 4.11 MIL/uL (ref 3.87–5.11)
RDW: 13.7 % (ref 11.5–15.5)
WBC: 14.1 10*3/uL — ABNORMAL HIGH (ref 4.0–10.5)

## 2014-06-10 LAB — TYPE AND SCREEN
ABO/RH(D): B POS
Antibody Screen: NEGATIVE

## 2014-06-10 SURGERY — IRRIGATION AND DEBRIDEMENT WOUND
Anesthesia: General | Site: Abdomen | Laterality: Left

## 2014-06-10 MED ORDER — ONDANSETRON HCL 4 MG PO TABS
4.0000 mg | ORAL_TABLET | Freq: Four times a day (QID) | ORAL | Status: DC | PRN
  Filled 2014-06-10: qty 1

## 2014-06-10 MED ORDER — DIPHENHYDRAMINE HCL 12.5 MG/5ML PO ELIX: 12.5000 mg | ORAL_SOLUTION | Freq: Four times a day (QID) | ORAL | Status: DC | PRN

## 2014-06-10 MED ORDER — HYDROMORPHONE HCL 2 MG/ML IJ SOLN
INTRAMUSCULAR | Status: AC
  Filled 2014-06-10: qty 1

## 2014-06-10 MED ORDER — ENOXAPARIN SODIUM 40 MG/0.4ML ~~LOC~~ SOLN
40.0000 mg | SUBCUTANEOUS | Status: DC
  Administered 2014-06-11 – 2014-06-16 (×6): 40 mg via SUBCUTANEOUS
  Filled 2014-06-10 (×7): qty 0.4

## 2014-06-10 MED ORDER — SODIUM CHLORIDE 0.9 % IJ SOLN: 9.0000 mL | INTRAMUSCULAR | Status: DC | PRN

## 2014-06-10 MED ORDER — MORPHINE SULFATE (PF) 1 MG/ML IV SOLN
INTRAVENOUS | Status: DC
  Administered 2014-06-10: 16.5 mg via INTRAVENOUS
  Administered 2014-06-10: 1.5 mg via INTRAVENOUS
  Administered 2014-06-10: 23.42 mg via INTRAVENOUS
  Administered 2014-06-11: 0 mg via INTRAVENOUS
  Administered 2014-06-11: 09:00:00 via INTRAVENOUS
  Administered 2014-06-11: 4.5 mg via INTRAVENOUS
  Administered 2014-06-11: 13.5 mg via INTRAVENOUS
  Administered 2014-06-11: 1.5 mg via INTRAVENOUS
  Administered 2014-06-11: 10.5 mg via INTRAVENOUS
  Administered 2014-06-12: 3 mg via INTRAVENOUS
  Administered 2014-06-12: 4.5 mg via INTRAVENOUS
  Administered 2014-06-12: 13.5 mg via INTRAVENOUS
  Administered 2014-06-12: 4.5 mg via INTRAVENOUS
  Administered 2014-06-12: 7.5 mg via INTRAVENOUS
  Administered 2014-06-13: 10.5 mg via INTRAVENOUS
  Administered 2014-06-13: 16.49 mg via INTRAVENOUS
  Administered 2014-06-13: 3 mg via INTRAVENOUS
  Administered 2014-06-13: 7.5 mg via INTRAVENOUS
  Administered 2014-06-13: 6 mg via INTRAVENOUS
  Administered 2014-06-14: 01:00:00 via INTRAVENOUS
  Filled 2014-06-10 (×6): qty 25

## 2014-06-10 MED ORDER — EPHEDRINE SULFATE 50 MG/ML IJ SOLN
INTRAMUSCULAR | Status: DC | PRN
  Administered 2014-06-10: 10 mg via INTRAVENOUS

## 2014-06-10 MED ORDER — ADULT MULTIVITAMIN W/MINERALS CH
1.0000 | ORAL_TABLET | Freq: Every day | ORAL | Status: DC
  Administered 2014-06-10 – 2014-06-13 (×4): 1 via ORAL
  Filled 2014-06-10 (×7): qty 1

## 2014-06-10 MED ORDER — PROMETHAZINE HCL 25 MG/ML IJ SOLN
INTRAMUSCULAR | Status: AC
  Administered 2014-06-10: 6.25 mg
  Filled 2014-06-10: qty 1

## 2014-06-10 MED ORDER — MIDAZOLAM HCL 5 MG/5ML IJ SOLN
INTRAMUSCULAR | Status: DC | PRN
  Administered 2014-06-10: 2 mg via INTRAVENOUS

## 2014-06-10 MED ORDER — BUPIVACAINE 0.25 % ON-Q PUMP DUAL CATH 300 ML
300.0000 mL | INJECTION | Status: DC
  Administered 2014-06-10: 300 mL
  Filled 2014-06-10: qty 300

## 2014-06-10 MED ORDER — KCL IN DEXTROSE-NACL 20-5-0.45 MEQ/L-%-% IV SOLN
INTRAVENOUS | Status: DC
  Administered 2014-06-10 – 2014-06-12 (×4): via INTRAVENOUS
  Administered 2014-06-13: 100 mL/h via INTRAVENOUS
  Administered 2014-06-14: 01:00:00 via INTRAVENOUS
  Administered 2014-06-14: 100 mL/h via INTRAVENOUS
  Administered 2014-06-15: 23:00:00 via INTRAVENOUS
  Filled 2014-06-10 (×13): qty 1000

## 2014-06-10 MED ORDER — CEFAZOLIN SODIUM-DEXTROSE 2-3 GM-% IV SOLR
2.0000 g | INTRAVENOUS | Status: AC
  Administered 2014-06-10: 2 g via INTRAVENOUS

## 2014-06-10 MED ORDER — POLYETHYLENE GLYCOL 3350 17 G PO PACK
17.0000 g | PACK | Freq: Two times a day (BID) | ORAL | Status: DC
  Administered 2014-06-10 – 2014-06-13 (×7): 17 g via ORAL
  Filled 2014-06-10 (×12): qty 1

## 2014-06-10 MED ORDER — ONDANSETRON HCL 4 MG/2ML IJ SOLN: 4.0000 mg | Freq: Four times a day (QID) | INTRAMUSCULAR | Status: DC | PRN

## 2014-06-10 MED ORDER — FENTANYL CITRATE 0.05 MG/ML IJ SOLN
INTRAMUSCULAR | Status: DC | PRN
  Administered 2014-06-10 (×5): 50 ug via INTRAVENOUS

## 2014-06-10 MED ORDER — PROPOFOL 10 MG/ML IV BOLUS
INTRAVENOUS | Status: AC
  Filled 2014-06-10: qty 20

## 2014-06-10 MED ORDER — MIDAZOLAM HCL 2 MG/2ML IJ SOLN: 0.5000 mg | INTRAMUSCULAR | Status: DC | PRN

## 2014-06-10 MED ORDER — FENTANYL CITRATE 0.05 MG/ML IJ SOLN: 25.0000 ug | INTRAMUSCULAR | Status: DC | PRN

## 2014-06-10 MED ORDER — ONDANSETRON HCL 4 MG/2ML IJ SOLN
INTRAMUSCULAR | Status: DC | PRN
  Administered 2014-06-10: 4 mg via INTRAVENOUS

## 2014-06-10 MED ORDER — SUCCINYLCHOLINE CHLORIDE 20 MG/ML IJ SOLN
INTRAMUSCULAR | Status: DC | PRN
  Administered 2014-06-10: 100 mg via INTRAVENOUS

## 2014-06-10 MED ORDER — GLYCOPYRROLATE 0.2 MG/ML IJ SOLN
INTRAMUSCULAR | Status: DC | PRN
  Administered 2014-06-10: 0.6 mg via INTRAVENOUS

## 2014-06-10 MED ORDER — PROPOFOL 10 MG/ML IV BOLUS
INTRAVENOUS | Status: DC | PRN
  Administered 2014-06-10: 150 mg via INTRAVENOUS

## 2014-06-10 MED ORDER — DIPHENHYDRAMINE HCL 50 MG/ML IJ SOLN
12.5000 mg | Freq: Four times a day (QID) | INTRAMUSCULAR | Status: DC | PRN
  Administered 2014-06-11: 12.5 mg via INTRAVENOUS
  Filled 2014-06-10: qty 1

## 2014-06-10 MED ORDER — DEXAMETHASONE SODIUM PHOSPHATE 10 MG/ML IJ SOLN
INTRAMUSCULAR | Status: AC
  Filled 2014-06-10: qty 1

## 2014-06-10 MED ORDER — PROMETHAZINE HCL 25 MG/ML IJ SOLN
6.2500 mg | INTRAMUSCULAR | Status: AC | PRN
  Administered 2014-06-10 (×2): 6.25 mg via INTRAVENOUS

## 2014-06-10 MED ORDER — ONDANSETRON HCL 4 MG/2ML IJ SOLN
4.0000 mg | Freq: Four times a day (QID) | INTRAMUSCULAR | Status: DC | PRN
  Administered 2014-06-12 – 2014-06-13 (×2): 4 mg via INTRAVENOUS
  Filled 2014-06-10 (×2): qty 2

## 2014-06-10 MED ORDER — POLYETHYLENE GLYCOL 3350 17 GM/SCOOP PO POWD: 8.5000 g | Freq: Two times a day (BID) | ORAL | Status: DC

## 2014-06-10 MED ORDER — BUPIVACAINE ON-Q PAIN PUMP (FOR ORDER SET NO CHG)
INJECTION | Status: DC
  Filled 2014-06-10: qty 1

## 2014-06-10 MED ORDER — 0.9 % SODIUM CHLORIDE (POUR BTL) OPTIME
TOPICAL | Status: DC | PRN
  Administered 2014-06-10: 2000 mL

## 2014-06-10 MED ORDER — ZIPRASIDONE HCL 20 MG PO CAPS
20.0000 mg | ORAL_CAPSULE | Freq: Two times a day (BID) | ORAL | Status: DC
  Administered 2014-06-10 – 2014-06-11 (×2): 20 mg via ORAL
  Filled 2014-06-10 (×5): qty 1

## 2014-06-10 MED ORDER — OXYCODONE-ACETAMINOPHEN 5-325 MG PO TABS
1.0000 | ORAL_TABLET | Freq: Four times a day (QID) | ORAL | Status: DC | PRN
  Administered 2014-06-14 – 2014-06-16 (×5): 2 via ORAL
  Filled 2014-06-10 (×6): qty 2

## 2014-06-10 MED ORDER — NALOXONE HCL 0.4 MG/ML IJ SOLN: 0.4000 mg | INTRAMUSCULAR | Status: DC | PRN

## 2014-06-10 MED ORDER — ACETAMINOPHEN 500 MG PO TABS
1000.0000 mg | ORAL_TABLET | Freq: Four times a day (QID) | ORAL | Status: AC
  Administered 2014-06-10 – 2014-06-11 (×3): 1000 mg via ORAL
  Filled 2014-06-10 (×3): qty 2

## 2014-06-10 MED ORDER — MIDAZOLAM HCL 2 MG/2ML IJ SOLN
INTRAMUSCULAR | Status: AC
  Filled 2014-06-10: qty 2

## 2014-06-10 MED ORDER — MORPHINE SULFATE (PF) 1 MG/ML IV SOLN
INTRAVENOUS | Status: AC
  Filled 2014-06-10: qty 25

## 2014-06-10 MED ORDER — ROCURONIUM BROMIDE 100 MG/10ML IV SOLN
INTRAVENOUS | Status: DC | PRN
  Administered 2014-06-10: 30 mg via INTRAVENOUS
  Administered 2014-06-10: 10 mg via INTRAVENOUS

## 2014-06-10 MED ORDER — DEXAMETHASONE SODIUM PHOSPHATE 10 MG/ML IJ SOLN
INTRAMUSCULAR | Status: DC | PRN
  Administered 2014-06-10: 10 mg via INTRAVENOUS

## 2014-06-10 MED ORDER — PEG 3350-KCL-NA BICARB-NACL 420 G PO SOLR: 250.0000 mL | Freq: Three times a day (TID) | ORAL | Status: DC

## 2014-06-10 MED ORDER — CEFAZOLIN SODIUM 1-5 GM-% IV SOLN
1.0000 g | Freq: Four times a day (QID) | INTRAVENOUS | Status: AC
  Administered 2014-06-10 – 2014-06-11 (×3): 1 g via INTRAVENOUS
  Filled 2014-06-10 (×3): qty 50

## 2014-06-10 MED ORDER — ROCURONIUM BROMIDE 100 MG/10ML IV SOLN
INTRAVENOUS | Status: AC
  Filled 2014-06-10: qty 1

## 2014-06-10 MED ORDER — KETOROLAC TROMETHAMINE 30 MG/ML IJ SOLN
30.0000 mg | Freq: Once | INTRAMUSCULAR | Status: AC | PRN
  Administered 2014-06-10: 30 mg via INTRAVENOUS

## 2014-06-10 MED ORDER — GLYCOPYRROLATE 0.2 MG/ML IJ SOLN
INTRAMUSCULAR | Status: AC
  Filled 2014-06-10: qty 3

## 2014-06-10 MED ORDER — POTASSIUM CHLORIDE ER 10 MEQ PO TBCR
10.0000 meq | EXTENDED_RELEASE_TABLET | Freq: Two times a day (BID) | ORAL | Status: DC
  Administered 2014-06-10 – 2014-06-16 (×10): 10 meq via ORAL
  Filled 2014-06-10 (×13): qty 1

## 2014-06-10 MED ORDER — BUPIVACAINE-EPINEPHRINE (PF) 0.25% -1:200000 IJ SOLN
INTRAMUSCULAR | Status: AC
  Filled 2014-06-10: qty 30

## 2014-06-10 MED ORDER — HYDROMORPHONE HCL 1 MG/ML IJ SOLN
INTRAMUSCULAR | Status: DC | PRN
  Administered 2014-06-10 (×3): 0.5 mg via INTRAVENOUS
  Administered 2014-06-10 (×3): 1 mg via INTRAVENOUS
  Administered 2014-06-10: 0.5 mg via INTRAVENOUS
  Administered 2014-06-10: 1 mg via INTRAVENOUS

## 2014-06-10 MED ORDER — FENTANYL CITRATE 0.05 MG/ML IJ SOLN
INTRAMUSCULAR | Status: AC
  Filled 2014-06-10: qty 5

## 2014-06-10 MED ORDER — LIDOCAINE HCL (CARDIAC) 20 MG/ML IV SOLN
INTRAVENOUS | Status: DC | PRN
  Administered 2014-06-10: 100 mg via INTRAVENOUS

## 2014-06-10 MED ORDER — LIDOCAINE HCL 1 % IJ SOLN
INTRAMUSCULAR | Status: AC
  Filled 2014-06-10: qty 20

## 2014-06-10 MED ORDER — LIDOCAINE HCL (CARDIAC) 20 MG/ML IV SOLN
INTRAVENOUS | Status: AC
  Filled 2014-06-10: qty 5

## 2014-06-10 MED ORDER — KETOROLAC TROMETHAMINE 15 MG/ML IJ SOLN
15.0000 mg | Freq: Four times a day (QID) | INTRAMUSCULAR | Status: AC
  Administered 2014-06-10 – 2014-06-11 (×4): 15 mg via INTRAVENOUS
  Filled 2014-06-10 (×4): qty 1

## 2014-06-10 MED ORDER — MORPHINE SULFATE 2 MG/ML IJ SOLN: 1.0000 mg | INTRAMUSCULAR | Status: DC | PRN

## 2014-06-10 MED ORDER — ONDANSETRON HCL 4 MG/2ML IJ SOLN
INTRAMUSCULAR | Status: AC
  Filled 2014-06-10: qty 2

## 2014-06-10 MED ORDER — NEOSTIGMINE METHYLSULFATE 10 MG/10ML IV SOLN
INTRAVENOUS | Status: DC | PRN
  Administered 2014-06-10: 4 mg via INTRAVENOUS

## 2014-06-10 MED ORDER — DIPHENHYDRAMINE HCL 25 MG PO CAPS: 25.0000 mg | ORAL_CAPSULE | Freq: Four times a day (QID) | ORAL | Status: DC | PRN

## 2014-06-10 MED ORDER — SIMETHICONE 80 MG PO CHEW
160.0000 mg | CHEWABLE_TABLET | Freq: Four times a day (QID) | ORAL | Status: DC | PRN
  Administered 2014-06-10 – 2014-06-16 (×10): 160 mg via ORAL
  Filled 2014-06-10 (×13): qty 2

## 2014-06-10 MED ORDER — LACTATED RINGERS IV SOLN
INTRAVENOUS | Status: DC | PRN
  Administered 2014-06-10 (×2): via INTRAVENOUS

## 2014-06-10 MED ORDER — KETOROLAC TROMETHAMINE 30 MG/ML IJ SOLN
INTRAMUSCULAR | Status: AC
  Filled 2014-06-10: qty 1

## 2014-06-10 MED ORDER — CEFAZOLIN SODIUM-DEXTROSE 2-3 GM-% IV SOLR
INTRAVENOUS | Status: AC
  Filled 2014-06-10: qty 50

## 2014-06-10 SURGICAL SUPPLY — 47 items
BNDG GAUZE ELAST 4 BULKY (GAUZE/BANDAGES/DRESSINGS) IMPLANT
CATH KIT ON-Q SILVERSOAK 5IN (CATHETERS) ×4 IMPLANT
COVER SURGICAL LIGHT HANDLE (MISCELLANEOUS) ×2 IMPLANT
DRAIN CHANNEL 19F RND (DRAIN) ×2 IMPLANT
DRAIN PENROSE 18X1/2 LTX STRL (DRAIN) ×2 IMPLANT
DRAPE EXTREMITY T 121X128X90 (DRAPE) IMPLANT
DRAPE SHEET LG 3/4 BI-LAMINATE (DRAPES) ×2 IMPLANT
DRSG TELFA 4X10 ISLAND STR (GAUZE/BANDAGES/DRESSINGS) ×2 IMPLANT
ELECT BLADE 6.5 EXT (BLADE) ×2 IMPLANT
ELECT REM PT RETURN 9FT ADLT (ELECTROSURGICAL) ×2
ELECTRODE REM PT RTRN 9FT ADLT (ELECTROSURGICAL) ×1 IMPLANT
GAUZE SPONGE 4X4 12PLY STRL (GAUZE/BANDAGES/DRESSINGS) IMPLANT
GLOVE BIO SURGEON STRL SZ 6.5 (GLOVE) ×2 IMPLANT
GLOVE BIOGEL PI IND STRL 6.5 (GLOVE) ×1 IMPLANT
GLOVE BIOGEL PI IND STRL 7.0 (GLOVE) ×2 IMPLANT
GLOVE BIOGEL PI INDICATOR 6.5 (GLOVE) ×1
GLOVE BIOGEL PI INDICATOR 7.0 (GLOVE) ×2
GLOVE ECLIPSE 8.0 STRL XLNG CF (GLOVE) IMPLANT
GLOVE INDICATOR 8.0 STRL GRN (GLOVE) ×4 IMPLANT
GLOVE SURG SS PI 6.5 STRL IVOR (GLOVE) ×2 IMPLANT
GLOVE SURG SS PI 7.5 STRL IVOR (GLOVE) ×4 IMPLANT
GOWN STRL REUS W/ TWL XL LVL3 (GOWN DISPOSABLE) ×3 IMPLANT
GOWN STRL REUS W/TWL 2XL LVL3 (GOWN DISPOSABLE) ×4 IMPLANT
GOWN STRL REUS W/TWL LRG LVL3 (GOWN DISPOSABLE) ×4 IMPLANT
GOWN STRL REUS W/TWL XL LVL3 (GOWN DISPOSABLE) ×3
KIT BASIN OR (CUSTOM PROCEDURE TRAY) ×2 IMPLANT
NS IRRIG 1000ML POUR BTL (IV SOLUTION) ×2 IMPLANT
PACK GENERAL/GYN (CUSTOM PROCEDURE TRAY) ×2 IMPLANT
PUMP PAIN ON-Q (MISCELLANEOUS) ×2 IMPLANT
SHEARS HARMONIC 9CM CVD (BLADE) ×2 IMPLANT
SPONGE DRAIN TRACH 4X4 STRL 2S (GAUZE/BANDAGES/DRESSINGS) ×2 IMPLANT
STOCKINETTE 8 INCH (MISCELLANEOUS) IMPLANT
SUT ETHILON 2 0 PS N (SUTURE) ×2 IMPLANT
SUT MNCRL AB 4-0 PS2 18 (SUTURE) ×2 IMPLANT
SUT PDS AB 1 TP1 96 (SUTURE) ×4 IMPLANT
SUT SILK 2 0 (SUTURE) ×2
SUT SILK 2-0 18XBRD TIE 12 (SUTURE) ×1 IMPLANT
SUT SILK 2-0 30XBRD TIE 12 (SUTURE) ×1 IMPLANT
SUT SILK 3 0 (SUTURE) ×1
SUT SILK 3 0 SH CR/8 (SUTURE) ×2 IMPLANT
SUT SILK 3-0 18XBRD TIE 12 (SUTURE) ×1 IMPLANT
SWAB COLLECTION DEVICE MRSA (MISCELLANEOUS) IMPLANT
SYR BULB IRRIGATION 50ML (SYRINGE) ×2 IMPLANT
TOWEL OR 17X26 10 PK STRL BLUE (TOWEL DISPOSABLE) ×2 IMPLANT
TUBE ANAEROBIC SPECIMEN COL (MISCELLANEOUS) IMPLANT
TUNNELER SHEATH ON-Q 16GX12 DP (PAIN MANAGEMENT) ×2 IMPLANT
UNDERPAD 30X30 INCONTINENT (UNDERPADS AND DIAPERS) IMPLANT

## 2014-06-10 NOTE — Anesthesia Procedure Notes (Signed)
Procedure Name: Intubation Date/Time: 06/10/2014 11:38 AM Performed by: Maxwell Caul Pre-anesthesia Checklist: Patient identified, Emergency Drugs available, Suction available and Patient being monitored Patient Re-evaluated:Patient Re-evaluated prior to inductionOxygen Delivery Method: Circle System Utilized Preoxygenation: Pre-oxygenation with 100% oxygen Intubation Type: IV induction Ventilation: Mask ventilation without difficulty Laryngoscope Size: Mac and 4 Grade View: Grade I Tube type: Oral Tube size: 7.0 mm Number of attempts: 1 Airway Equipment and Method: Stylet and Oral airway Placement Confirmation: ETT inserted through vocal cords under direct vision,  positive ETCO2 and breath sounds checked- equal and bilateral Secured at: 21 cm Tube secured with: Tape Dental Injury: Teeth and Oropharynx as per pre-operative assessment

## 2014-06-10 NOTE — Transfer of Care (Signed)
Immediate Anesthesia Transfer of Care Note  Patient: Cassandra Cunningham  Procedure(s) Performed: Procedure(s) (LRB): EXCISION OF LEFT RETROPERITONEAL MASS (Left)  Patient Location: PACU  Anesthesia Type: General  Level of Consciousness: sedated, patient cooperative and responds to stimulation  Airway & Oxygen Therapy: Patient Spontanous Breathing and Patient connected to face mask oxgen  Post-op Assessment: Report given to PACU RN and Post -op Vital signs reviewed and stable  Post vital signs: Reviewed and stable  Complications: No apparent anesthesia complications

## 2014-06-10 NOTE — H&P (Signed)
Cassandra Cunningham. [name]  Location: [facility] Patient #: [id number] DOB: 1970-07-06 Divorced / Language: Cassandra Cunningham / Race: White Female  History of Present Illness The patient is a 44 year old female who presents for a laparoscopy post-op. History of Present Illness: This patient presents today status post lap appy by Dr. Barry Cunningham. 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 No cancer noted. The patient is tolerating a regular diet, having normal bowel movements, has good pain control. The patient is back to most normal activities. She would like to have her lymphangioma removed.    Other Problems Anxiety Disorder Depression Hemorrhoids Kidney Stone Other disease, cancer, significant illness  Past Surgical History  Cesarean Section - 1 Colon Polyp Removal - Colonoscopy Oral Surgery  Diagnostic Studies History Colonoscopy 1-5 years ago Mammogram within last year Pap Smear 1-5 years ago  Allergies No Known Drug Allergies02/16/2016  Medication History Vitamins & Minerals (Oral) Active. Benadryl Allergy (25MG  Tablet, Oral) Active. Oxycodone-Acetaminophen (5-325MG  Tablet, Oral) Active. MiraLax (Oral) Active. Potassium Chloride ER (10MEQ Capsule ER, Oral) Active. Mylicon-80 (80MG  Tablet Chewable, Oral) Active. Desyrel (100MG  Tablet, Oral) Active.  Social History Alcohol use Occasional alcohol use. Caffeine use Carbonated beverages, Coffee, Tea. Illicit drug use Remotely quit drug use. Tobacco use Former smoker.  Family History Alcohol Abuse Father. Colon Polyps Family Members In General. Depression Brother, Family Members In General, Father, Sister. Diabetes Mellitus Brother, Family Members In General. Heart Disease Family Members In General, Father. Heart disease in female family member before age 75 Hypertension Father. Kidney Disease Daughter, Family Members In General, Mother. Melanoma  Sister. Migraine Headache Daughter, Mother, Cassandra Cunningham, Cassandra Cunningham. Ovarian Cancer Family Members In General. Thyroid problems Sister.  Pregnancy / Birth History  Gravida 4 Irregular periods Maternal age 57-20 Para 2  Review of Systems General Present- Weight Gain. Not Present- Appetite Loss, Chills, Fatigue, Fever, Night Sweats and Weight Loss. Skin Present- Dryness. Not Present- Change in Wart/Mole, Hives, Jaundice, New Lesions, Non-Healing Wounds, Rash and Ulcer. HEENT Not Present- Earache, Hearing Loss, Hoarseness, Nose Bleed, Oral Ulcers, Ringing in the Ears, Seasonal Allergies, Sinus Pain, Sore Throat, Visual Disturbances, Wears glasses/contact lenses and Yellow Eyes. Respiratory Not Present- Bloody sputum, Chronic Cough, Difficulty Breathing, Snoring and Wheezing. Breast Not Present- Breast Mass, Breast Pain, Nipple Discharge and Skin Changes. Cardiovascular Present- Swelling of Extremities. Not Present- Chest Pain, Difficulty Breathing Lying Down, Leg Cramps, Palpitations, Rapid Heart Rate and Shortness of Breath. Gastrointestinal Present- Bloating, Constipation, Excessive gas, Hemorrhoids and Rectal Pain. Not Present- Abdominal Pain, Bloody Stool, Change in Bowel Habits, Chronic diarrhea, Difficulty Swallowing, Gets full quickly at meals, Indigestion, Nausea and Vomiting. Female Genitourinary Not Present- Frequency, Nocturia, Painful Urination, Pelvic Pain and Urgency. Musculoskeletal Present- Muscle Pain and Swelling of Extremities. Not Present- Back Pain, Joint Pain, Joint Stiffness and Muscle Weakness. Neurological Not Present- Decreased Memory, Fainting, Headaches, Numbness, Seizures, Tingling, Tremor, Trouble walking and Weakness. Psychiatric Present- Anxiety, Bipolar, Change in Sleep Pattern, Depression, Fearful and Frequent crying. Endocrine Not Present- Cold Intolerance, Excessive Hunger, Hair Changes, Heat Intolerance, Hot flashes and New Diabetes. Hematology Present- Gland  problems. Not Present- Easy Bruising, Excessive bleeding, HIV and Persistent Infections.   Vitals Wt Readings from Last 3 Encounters:  05/05/14 51.529 kg (113 lb 9.6 oz)  04/14/14 49.896 kg (110 lb)  05/25/13 48.535 kg (107 lb)   Temp Readings from Last 3 Encounters:  04/15/14 97.6 F (36.4 C) Oral  05/25/13 97.6 F (36.4 C) Oral  01/01/13 97.4 F (36.3 C) Oral  BP Readings from Last 3 Encounters:  05/05/14 100/60  04/15/14 107/65  05/25/13 106/68   Pulse Readings from Last 3 Encounters:  05/05/14 53  04/14/14 49  05/25/13 43       Physical Exam The physical exam findings are as follows: Note:Abdomen: Soft, NT/ND, dermabond in place was removed, incisions well healed. No HSM or hernias.    Assessment & Plan  S/P LAPAROSCOPIC APPENDECTOMY (V45.89  Z90.49) Current Plans  Resume all normal activities Follow up as needed Started MiraLax, 1 (one) Tablespoon two times daily, as needed, 1 Container, 05/05/2014, Ref. x3. 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

## 2014-06-10 NOTE — Anesthesia Preprocedure Evaluation (Addendum)
Anesthesia Evaluation  Patient identified by MRN, date of birth, ID band Patient awake    Reviewed: Allergy & Precautions, NPO status , Patient's Chart, lab work & pertinent test results  Airway Mallampati: II  TM Distance: >3 FB Neck ROM: Full    Dental no notable dental hx.    Pulmonary neg pulmonary ROS, former smoker,  breath sounds clear to auscultation  Pulmonary exam normal       Cardiovascular negative cardio ROS  Rhythm:Regular Rate:Normal     Neuro/Psych Bipolar Disorder Schizophrenia negative neurological ROS     GI/Hepatic negative GI ROS, (+)     substance abuse  alcohol use,   Endo/Other  negative endocrine ROS  Renal/GU negative Renal ROS  negative genitourinary   Musculoskeletal negative musculoskeletal ROS (+)   Abdominal   Peds negative pediatric ROS (+)  Hematology  (+) anemia ,   Anesthesia Other Findings   Reproductive/Obstetrics negative OB ROS                            Anesthesia Physical Anesthesia Plan  ASA: II  Anesthesia Plan: General   Post-op Pain Management:    Induction: Intravenous  Airway Management Planned: LMA and Oral ETT  Additional Equipment:   Intra-op Plan:   Post-operative Plan: Extubation in OR  Informed Consent: I have reviewed the patients History and Physical, chart, labs and discussed the procedure including the risks, benefits and alternatives for the proposed anesthesia with the patient or authorized representative who has indicated his/her understanding and acceptance.   Dental advisory given  Plan Discussed with: CRNA and Surgeon  Anesthesia Plan Comments:         Anesthesia Quick Evaluation

## 2014-06-10 NOTE — Op Note (Signed)
PRE-OPERATIVE DIAGNOSIS: Left cystic retroperitoneal mass, 12 x 5 cm  POST-OPERATIVE DIAGNOSIS:  Same  PROCEDURE:  Procedure(s): Excision of left retroperitoneal mass.  SURGEON:  Surgeon(s): Stark Klein, MD  Assistant: Surgeon: Leighton Ruff M.D.  ANESTHESIA:   local and general  DRAINS: (19 Fr) Blake drain(s) in the LUQ   LOCAL MEDICATIONS USED:  BUPIVICAINE  and LIDOCAINE   SPECIMEN:  Source of Specimen:  left retroperitoneal mass  DISPOSITION OF SPECIMEN:  PATHOLOGY  COUNTS:  YES  DICTATION: .Dragon Dictation  PLAN OF CARE: Admit to inpatient   PATIENT DISPOSITION:  PACU - hemodynamically stable.  FINDINGS:  Cystic retroperitoneal mass very intimately associated with the tail of the pancreas and the mesentery of the distal transverse colon.    EBL: ,50  PROCEDURE:  Patient was identified in the holding area and taken to the operating room where she was placed supine on the operating table.  General endotracheal anesthesia was induced. A Foley catheter was placed as well as an orogastric tube. The patient's abdomen was then prepped and draped in sterile fashion. A timeout was performed according to the surgical safety checklist. When all was correct, we continued.  A midline incision was made in the upper abdomen between the xiphoid and the umbilicus. This was started several centimeters below the xiphoid. The subcutaneous tissues were divided with the cautery. The fascia was opened in the midline and the fascial incision was opened the length of the skin incision. The peritoneum was entered sharply with the scissors. The liver appeared normal. The gallbladder was a nice robin's egg blue with no sign of inflammation and no stones.  The stomach and spleen appear normal. The colon appeared normal. The mass was immediately visible behind the splenic flexure. It was pushing the splenic flexure anteriorly. The omentum was taken off of the mass. The splenic flexure and distal  transverse colon were reflected inferiorly off the mass. The attachments to the spleen were taken down with the harmonic scalpel. This was done very painstakingly in order to minimize risk of damage to the mass and adjacent structures. The tail the pancreas was very intimately associated with the mass. This was able to be carefully separated. The mass was also very close to the IMV. This also was taken off painstakingly but not divided. The Overholt clamp and the harmonic scalpel was used to perform the dissection.    The mass was extremely lobulated. There was a portion of the mass that did go behind the IMV and close to the aorta. This was taken along with the rest of the mass. It was taken all in one piece.  The mass was carefully taken up out of the abdomen. The mesocolon was closed using interrupted 2-0 and 3-0 silk sutures. The lesser sac was closed as well there was just one small defect in the gastro-colic ligament. The abdomen was irrigated and inspected for hemostasis.   A 19 [place] Blake drain was placed near the tail of the pancreas. This was allowed to exit the abdominal wall in the left lower quadrant. The On-Q tunnelers were placed on either side of the fascial incision. The fascia was closed using #1 looped PDS suture. The knot was buried. The skin was then irrigated and closed with 4-0 Monocryl and running subcuticular fashion. The wounds were cleaned, dried, and dressed with Tegaderm. The On-Q catheters were advanced through the tunnelers and these were secured with Steri-Strips and Tegaderm.  The patient was allowed to emerge from anesthesia and  taken to the PACU in stable condition. Needle, sponge, and instrument counts were correct 2.

## 2014-06-10 NOTE — Interval H&P Note (Signed)
History and Physical Interval Note:  06/10/2014 11:27 AM  Cassandra Cunningham  has presented today for surgery, with the diagnosis of LEFT UPPER QUADRANT MASS  The various methods of treatment have been discussed with the patient and family. After consideration of risks, benefits and other options for treatment, the patient has consented to  Procedure(s): EXCISION OF LEFT UPPER QUADRANT MASS (Left) as a surgical intervention .  The patient's history has been reviewed, patient examined, no change in status, stable for surgery.  I have reviewed the patient's chart and labs.  Questions were answered to the patient's satisfaction.     Cleofas Hudgins

## 2014-06-10 NOTE — Interval H&P Note (Signed)
History and Physical Interval Note:  06/10/2014 11:29 AM  Cassandra Cunningham  has presented today for surgery, with the diagnosis of LEFT UPPER QUADRANT MASS  The various methods of treatment have been discussed with the patient and family. After consideration of risks, benefits and other options for treatment, the patient has consented to  Procedure(s): EXCISION OF LEFT UPPER QUADRANT MASS (Left) as a surgical intervention .  The patient's history has been reviewed, patient examined, no change in status, stable for surgery.  I have reviewed the patient's chart and labs.  Questions were answered to the patient's satisfaction.     Chloeanne Poteet

## 2014-06-10 NOTE — Discharge Instructions (Signed)
[facility]      [facility] °[phone] ° °ABDOMINAL SURGERY: POST OP INSTRUCTIONS ° °Always review your discharge instruction sheet given to you by the facility where your surgery was performed. ° °IF YOU HAVE DISABILITY OR FAMILY LEAVE FORMS, YOU MUST BRING THEM TO THE OFFICE FOR PROCESSING.  PLEASE DO NOT GIVE THEM TO YOUR DOCTOR. ° °1. A prescription for pain medication may be given to you upon discharge.  Take your pain medication as prescribed, if needed.  If narcotic pain medicine is not needed, then you may take acetaminophen (Tylenol) or ibuprofen (Advil) as needed. °2. Take your usually prescribed medications unless otherwise directed. °3. If you need a refill on your pain medication, please contact your pharmacy. They will contact our office to request authorization.  Prescriptions will not be filled after 5pm or on week-ends. °4. You should follow a light diet the first few days after arrival home, such as soup and crackers, pudding, etc.unless your doctor has advised otherwise. A high-fiber, low fat diet can be resumed as tolerated.   Be sure to include lots of fluids daily. Most patients will experience some swelling and bruising on the chest and neck area.  Ice packs will help.  Swelling and bruising can take several days to resolve °5. Most patients will experience some swelling and bruising in the area of the incision. Ice pack will help. Swelling and bruising can take several days to resolve..  °6. It is common to experience some constipation if taking pain medication after surgery.  Increasing fluid intake and taking a stool softener will usually help or prevent this problem from occurring.  A mild laxative (Milk of Magnesia or Miralax) should be taken according to package directions if there are no bowel movements after 48 hours. °7.  You may have steri-strips (small skin tapes) in place directly over the incision.  These strips should be left on the skin for 10-14 days.  If your  surgeon used skin glue on the incision, you may shower in 48 hours.  The glue will flake off over the next 2-3 weeks.  Any sutures or staples will be removed at the office during your follow-up visit. You may find that a light gauze bandage over your incision may keep your staples from being rubbed or pulled. You may shower and replace the bandage daily. °8. ACTIVITIES:  You may resume regular (light) daily activities beginning the next day--such as daily self-care, walking, climbing stairs--gradually increasing activities as tolerated.  You may have sexual intercourse when it is comfortable.  Refrain from any heavy lifting or straining until approved by your doctor. °a. You may drive when you no longer are taking prescription pain medication, you can comfortably wear a seatbelt, and you can safely maneuver your car and apply brakes °b. Return to Work: __________4-8 weeks if applicable_________________________ °9. You should see your doctor in the office for a follow-up appointment approximately two weeks after your surgery.  Make sure that you call for this appointment within a day or two after you arrive home to insure a convenient appointment time. °OTHER INSTRUCTIONS:  °_____________________________________________________________ °_____________________________________________________________ ° °WHEN TO CALL YOUR DOCTOR: °1. Fever over 101.0 °2. Inability to urinate °3. Nausea and/or vomiting °4. Extreme swelling or bruising °5. Continued bleeding from incision. °6. Increased pain, redness, or drainage from the incision. °7. Difficulty swallowing or breathing °8. Muscle cramping or spasms. °9. Numbness or tingling in hands or feet or around lips. ° °The clinic staff is   available to answer your questions during regular business hours.  Please don’t hesitate to call and ask to speak to one of the nurses if you have concerns. ° °For further questions, please visit [email or web address] ° ° ° °

## 2014-06-10 NOTE — Progress Notes (Signed)
Patient states there is absolutely no way she could be pregnant. Ua Preg test done 06/05/14  Negative

## 2014-06-10 NOTE — Anesthesia Postprocedure Evaluation (Signed)
  Anesthesia Post-op Note  Patient: Cassandra Cunningham  Procedure(s) Performed: Procedure(s): EXCISION OF LEFT RETROPERITONEAL MASS (Left)  Patient Location: PACU  Anesthesia Type:General  Level of Consciousness: awake, alert  and oriented  Airway and Oxygen Therapy: Patient Spontanous Breathing  Post-op Pain: mild, moderate  Post-op Assessment: Post-op Vital signs reviewed, Patient's Cardiovascular Status Stable, Respiratory Function Stable, Patent Airway, No signs of Nausea or vomiting and Pain level controlled  Post-op Vital Signs: Reviewed and stable  Last Vitals:  Filed Vitals:   06/10/14 1615  BP: 102/56  Pulse: 54  Temp:   Resp: 5    Complications: No apparent anesthesia complications

## 2014-06-11 ENCOUNTER — Telehealth (HOSPITAL_COMMUNITY): Payer: Self-pay

## 2014-06-11 ENCOUNTER — Encounter (HOSPITAL_COMMUNITY): Payer: Self-pay | Admitting: General Surgery

## 2014-06-11 DIAGNOSIS — F250 Schizoaffective disorder, bipolar type: Secondary | ICD-10-CM

## 2014-06-11 LAB — BASIC METABOLIC PANEL
Anion gap: 7 (ref 5–15)
BUN: 10 mg/dL (ref 6–23)
CO2: 25 mmol/L (ref 19–32)
Calcium: 8.6 mg/dL (ref 8.4–10.5)
Chloride: 100 mmol/L (ref 96–112)
Creatinine, Ser: 0.72 mg/dL (ref 0.50–1.10)
GFR calc Af Amer: >90 mL/min (ref >90)
GFR calc non Af Amer: >90 mL/min (ref >90)
Glucose, Bld: 104 mg/dL — ABNORMAL HIGH (ref 70–99)
Potassium: 4.2 mmol/L (ref 3.5–5.1)
Sodium: 132 mmol/L — ABNORMAL LOW (ref 135–145)

## 2014-06-11 LAB — CBC
HCT: 29.2 % — ABNORMAL LOW (ref 36.0–46.0)
Hemoglobin: 9.1 g/dL — ABNORMAL LOW (ref 12.0–15.0)
MCH: 25.9 pg — ABNORMAL LOW (ref 26.0–34.0)
MCHC: 31.2 g/dL (ref 30.0–36.0)
MCV: 83.2 fL (ref 78.0–100.0)
Platelets: 194 10*3/uL (ref 150–400)
RBC: 3.51 MIL/uL — ABNORMAL LOW (ref 3.87–5.11)
RDW: 14 % (ref 11.5–15.5)
WBC: 10 10*3/uL (ref 4.0–10.5)

## 2014-06-11 MED ORDER — FLUOXETINE HCL 20 MG PO CAPS: 20.0000 mg | ORAL_CAPSULE | Freq: Every day | ORAL | Status: DC

## 2014-06-11 MED ORDER — TRAZODONE HCL 100 MG PO TABS
100.0000 mg | ORAL_TABLET | Freq: Every day | ORAL | Status: DC
  Administered 2014-06-11 – 2014-06-13 (×3): 100 mg via ORAL
  Filled 2014-06-11 (×5): qty 1
  Filled 2014-06-11: qty 2
  Filled 2014-06-11: qty 1

## 2014-06-11 MED ORDER — RISPERIDONE 1 MG PO TABS: ORAL_TABLET | ORAL | Status: DC

## 2014-06-11 MED ORDER — RISPERIDONE 1 MG PO TABS
1.0000 mg | ORAL_TABLET | Freq: Two times a day (BID) | ORAL | Status: DC
  Administered 2014-06-11: 1 mg via ORAL
  Filled 2014-06-11 (×3): qty 1

## 2014-06-11 NOTE — Telephone Encounter (Signed)
Pt is states in the hospital for tumor removal. Pt states she is depressed and is not doing well. Pt doesn't want to get out of bed, has no motivation and sad mood. States AH are more frequent and occur more when alone. States Geodon is helping than later corrected herself to say Risperdal. States her self control is better. Anxiety and fear level are higher. She is afraid to go to sleep and is afraid to be alone.  A/P:  Schiozoaffective disorder- Bipolar type current episode depressed, GAD, Bulemia, alcohol dependence in remission 1. Continue Trazodone 2. Increase Risperdal to 1mg  qAM and 2mg  qPM. Pt advised to d/c Geodon.  3. Start trial of Prozac 20mg  po qAM for depression

## 2014-06-11 NOTE — Addendum Note (Signed)
Addended by: Charlcie Cradle on: 06/11/2014 11:07 AM   Modules accepted: Orders, Medications

## 2014-06-11 NOTE — Telephone Encounter (Signed)
Called and left a voice message for call back to the clinic.

## 2014-06-11 NOTE — Progress Notes (Signed)
Paged MD on call regarding patient's concern of psych med Geodon discontinued and patient now has nothing ordered and patient is concerned [name] RN 06-11-2014 5:22 PM

## 2014-06-11 NOTE — Progress Notes (Signed)
1 Day Post-Op  Subjective: Doing pretty well overall.    Objective: Vital signs in last 24 hours: Temp:  [97.4 F (36.3 C)-98.3 F (36.8 C)] 98.3 F (36.8 C) (03/24 0531) Pulse Rate:  [48-64] 52 (03/24 0531) Resp:  [5-20] 12 (03/24 0531) BP: (100-132)/(56-80) 100/66 mmHg (03/24 0531) SpO2:  [100 %] 100 % (03/24 0531) FiO2 (%):  [33 %-36 %] 36 % (03/24 0407) Weight:  [48.79 kg (107 lb 9 oz)] 48.79 kg (107 lb 9 oz) (03/23 1022) Last BM Date: 06/09/14  Intake/Output from previous day: 03/23 0701 - 03/24 0700 In: 3093.3 [I.V.:3093.3] Out: 0388 [Urine:1700; Drains:80; Blood:50] Intake/Output this shift:    General appearance: alert, cooperative and no distress Resp: breathing comfortably GI: soft, non distended, approp tender, some oozing on dressing.  drain serosang  Lab Results:   Recent Labs  06/10/14 1844 06/11/14 0505  WBC 14.1* 10.0  HGB 10.8* 9.1*  HCT 34.2* 29.2*  PLT 228 194   BMET  Recent Labs  06/10/14 1844 06/11/14 0505  NA  --  132*  K  --  4.2  CL  --  100  CO2  --  25  GLUCOSE  --  104*  BUN  --  10  CREATININE 0.80 0.72  CALCIUM  --  8.6   PT/INR No results for input(s): LABPROT, INR in the last 72 hours. ABG No results for input(s): PHART, HCO3 in the last 72 hours.  Invalid input(s): PCO2, PO2  Studies/Results: No results found.  Anti-infectives: Anti-infectives    Start     Dose/Rate Route Frequency Ordered Stop   06/10/14 1800  ceFAZolin (ANCEF) IVPB 1 g/50 mL premix     1 g 100 mL/hr over 30 Minutes Intravenous Every 6 hours 06/10/14 1744 06/11/14 1359   06/10/14 0952  ceFAZolin (ANCEF) IVPB 2 g/50 mL premix     2 g 100 mL/hr over 30 Minutes Intravenous On call to O.R. 06/10/14 0952 06/10/14 1145      Assessment/Plan: s/p Procedure(s): EXCISION OF LEFT RETROPERITONEAL MASS (Left) pulmonary toilet.  OOB Diet - clear liq Plan to clean her out with golytely once she is passing gas.  She has significant constipation.  She  has diarrhea around her firm stools.     LOS: 1 day    [facility] 06/11/2014

## 2014-06-12 ENCOUNTER — Encounter (HOSPITAL_COMMUNITY): Payer: Self-pay | Admitting: Surgery

## 2014-06-12 LAB — CBC
HCT: 30.1 % — ABNORMAL LOW (ref 36.0–46.0)
Hemoglobin: 9.3 g/dL — ABNORMAL LOW (ref 12.0–15.0)
MCH: 26 pg (ref 26.0–34.0)
MCHC: 30.9 g/dL (ref 30.0–36.0)
MCV: 84.1 fL (ref 78.0–100.0)
Platelets: 183 10*3/uL (ref 150–400)
RBC: 3.58 MIL/uL — ABNORMAL LOW (ref 3.87–5.11)
RDW: 14.1 % (ref 11.5–15.5)
WBC: 4.5 10*3/uL (ref 4.0–10.5)

## 2014-06-12 LAB — BASIC METABOLIC PANEL
Anion gap: 5 (ref 5–15)
BUN: 7 mg/dL (ref 6–23)
CO2: 26 mmol/L (ref 19–32)
Calcium: 8.5 mg/dL (ref 8.4–10.5)
Chloride: 107 mmol/L (ref 96–112)
Creatinine, Ser: 0.66 mg/dL (ref 0.50–1.10)
GFR calc Af Amer: >90 mL/min (ref >90)
GFR calc non Af Amer: >90 mL/min (ref >90)
Glucose, Bld: 97 mg/dL (ref 70–99)
Potassium: 5.1 mmol/L (ref 3.5–5.1)
Sodium: 138 mmol/L (ref 135–145)

## 2014-06-12 MED ORDER — ZIPRASIDONE HCL 20 MG PO CAPS
20.0000 mg | ORAL_CAPSULE | Freq: Two times a day (BID) | ORAL | Status: DC
  Administered 2014-06-12 – 2014-06-16 (×8): 20 mg via ORAL
  Filled 2014-06-12 (×11): qty 1

## 2014-06-12 MED ORDER — BISACODYL 10 MG RE SUPP
10.0000 mg | Freq: Every day | RECTAL | Status: DC | PRN
  Administered 2014-06-12: 10 mg via RECTAL
  Filled 2014-06-12: qty 1

## 2014-06-12 NOTE — Progress Notes (Signed)
General Surgery Note  LOS: 2 days  POD -  2 Days Post-Op  Assessment/Plan: 1.  EXCISION OF LEFT RETROPERITONEAL MASS - [place] - 06/10/2014  Path pending  On clear liquids - no flatus  2.  Schizoaffective disorder, bipolar  3.  DVT prophylaxis - Lovenox 4.  Anemia, acute blood loss -   Hgb - 9.3 - 06/12/2014   Principal Problem:   Retroperitoneal mass s/p excision 06/10/2014 Active Problems:   Schizoaffective disorder, bipolar type   GAD (generalized anxiety disorder)   Bulimia nervosa   Alcohol dependence in remission  Subjective:  Worried about not having a BM  But looks good.  By herself in the room. Objective:   Filed Vitals:   06/12/14 0723  BP:   Pulse:   Temp:   Resp: 14     Intake/Output from previous day:  03/24 0701 - 03/25 0700 In: 2720 [P.O.:720; I.V.:2000] Out: 7282 [Urine:7150; Drains:132]  Intake/Output this shift:  Total I/O In: -  Out: 850 [Urine:850]   Physical Exam:   General: Thinner WN WF who is alert and oriented.    HEENT: Normal. Pupils equal. .   Lungs: Clear.  IS to 1,200 cc   Abdomen: Mild distention   Wound: Clean.  Still has On Q pump.    Drain - 132 recorded last 24 hours   Lab Results:    Recent Labs  06/11/14 0505 06/12/14 0445  WBC 10.0 4.5  HGB 9.1* 9.3*  HCT 29.2* 30.1*  PLT 194 183    BMET   Recent Labs  06/11/14 0505 06/12/14 0445  NA 132* 138  K 4.2 5.1  CL 100 107  CO2 25 26  GLUCOSE 104* 97  BUN 10 7  CREATININE 0.72 0.66  CALCIUM 8.6 8.5    PT/INR  No results for input(s): LABPROT, INR in the last 72 hours.  ABG  No results for input(s): PHART, HCO3 in the last 72 hours.  Invalid input(s): PCO2, PO2   Studies/Results:  No results found.   Anti-infectives:   Anti-infectives    Start     Dose/Rate Route Frequency Ordered Stop   06/10/14 1800  ceFAZolin (ANCEF) IVPB 1 g/50 mL premix     1 g 100 mL/hr over 30 Minutes Intravenous Every 6 hours 06/10/14 1744 06/11/14 0920   06/10/14 0952   ceFAZolin (ANCEF) IVPB 2 g/50 mL premix     2 g 100 mL/hr over 30 Minutes Intravenous On call to O.R. 06/10/14 0952 06/10/14 1145      Alphonsa Overall, MD, FACS Pager: [facility] Office: [phone] 06/12/2014

## 2014-06-13 LAB — BASIC METABOLIC PANEL
Anion gap: 6 (ref 5–15)
BUN: <5 mg/dL — ABNORMAL LOW (ref 6–23)
CO2: 27 mmol/L (ref 19–32)
Calcium: 8.7 mg/dL (ref 8.4–10.5)
Chloride: 106 mmol/L (ref 96–112)
Creatinine, Ser: 0.67 mg/dL (ref 0.50–1.10)
GFR calc Af Amer: >90 mL/min (ref >90)
GFR calc non Af Amer: >90 mL/min (ref >90)
Glucose, Bld: 94 mg/dL (ref 70–99)
Potassium: 4.3 mmol/L (ref 3.5–5.1)
Sodium: 139 mmol/L (ref 135–145)

## 2014-06-13 LAB — CBC
HCT: 31.2 % — ABNORMAL LOW (ref 36.0–46.0)
Hemoglobin: 9.6 g/dL — ABNORMAL LOW (ref 12.0–15.0)
MCH: 26.2 pg (ref 26.0–34.0)
MCHC: 30.8 g/dL (ref 30.0–36.0)
MCV: 85 fL (ref 78.0–100.0)
Platelets: 227 10*3/uL (ref 150–400)
RBC: 3.67 MIL/uL — ABNORMAL LOW (ref 3.87–5.11)
RDW: 14.2 % (ref 11.5–15.5)
WBC: 3.6 10*3/uL — ABNORMAL LOW (ref 4.0–10.5)

## 2014-06-13 MED ORDER — BUPIVACAINE ON-Q PAIN PUMP (FOR ORDER SET NO CHG)
INJECTION | Status: AC
Start: 2014-06-13 — End: 2014-06-14
  Filled 2014-06-13: qty 1

## 2014-06-13 MED ORDER — BISACODYL 10 MG RE SUPP
10.0000 mg | Freq: Every day | RECTAL | Status: DC
  Administered 2014-06-13 – 2014-06-15 (×2): 10 mg via RECTAL
  Filled 2014-06-13 (×3): qty 1

## 2014-06-13 MED ORDER — SORBITOL 70 % SOLN
960.0000 mL | TOPICAL_OIL | Freq: Once | ORAL | Status: AC | PRN
  Filled 2014-06-13: qty 240

## 2014-06-13 MED ORDER — SORBITOL 70 % SOLN
960.0000 mL | TOPICAL_OIL | Freq: Once | ORAL | Status: AC
  Administered 2014-06-13: 960 mL via RECTAL
  Filled 2014-06-13: qty 240

## 2014-06-13 MED ORDER — BISACODYL 10 MG RE SUPP
10.0000 mg | Freq: Two times a day (BID) | RECTAL | Status: DC | PRN
  Administered 2014-06-14: 10 mg via RECTAL

## 2014-06-13 MED ORDER — LORAZEPAM 2 MG/ML IJ SOLN: 0.5000 mg | Freq: Four times a day (QID) | INTRAMUSCULAR | Status: DC | PRN

## 2014-06-13 NOTE — Progress Notes (Signed)
General Surgery Note  LOS: 3 days  POD -  3 Days Post-Op  Assessment/Plan: 1.  EXCISION OF LEFT RETROPERITONEAL MASS - [place] - 06/10/2014  Path pending  On clear liquids - no flatus - will leave on current diet until bowel function  1A.  OnQ removed  2.  Schizoaffective disorder, bipolar  Geodon restarted 3.  DVT prophylaxis - Lovenox 4.  Anemia, acute blood loss -   Hgb - 9.6 - 06/13/2014   Principal Problem:   Retroperitoneal mass s/p excision 06/10/2014 Active Problems:   Schizoaffective disorder, bipolar type   GAD (generalized anxiety disorder)   Bulimia nervosa   Alcohol dependence in remission  Subjective:  Looks better.  But still no BM or flatus.  She is getting daily miralax.  By herself in the room. Objective:   Filed Vitals:   06/13/14 0636  BP:   Pulse:   Temp:   Resp: 16     Intake/Output from previous day:  03/25 0701 - 03/26 0700 In: 4680 [P.O.:2280; I.V.:2400] Out: 8025 [Urine:7850; Drains:175]  Intake/Output this shift:      Physical Exam:   General: Thinner WN WF who is alert and oriented.    HEENT: Normal. Pupils equal. .   Lungs: Clear.     Abdomen: Mild distention.  I don't hear many bowel sounds.   Wound: Clean.  Removed On Q pump.    Drain - 175 recorded last 24 hours   Lab Results:     Recent Labs  06/12/14 0445 06/13/14 0510  WBC 4.5 3.6*  HGB 9.3* 9.6*  HCT 30.1* 31.2*  PLT 183 227    BMET    Recent Labs  06/12/14 0445 06/13/14 0510  NA 138 139  K 5.1 4.3  CL 107 106  CO2 26 27  GLUCOSE 97 94  BUN 7 <5*  CREATININE 0.66 0.67  CALCIUM 8.5 8.7    PT/INR  No results for input(s): LABPROT, INR in the last 72 hours.  ABG  No results for input(s): PHART, HCO3 in the last 72 hours.  Invalid input(s): PCO2, PO2   Studies/Results:  No results found.   Anti-infectives:   Anti-infectives    Start     Dose/Rate Route Frequency Ordered Stop   06/10/14 1800  ceFAZolin (ANCEF) IVPB 1 g/50 mL premix     1 g 100  mL/hr over 30 Minutes Intravenous Every 6 hours 06/10/14 1744 06/11/14 0920   06/10/14 0952  ceFAZolin (ANCEF) IVPB 2 g/50 mL premix     2 g 100 mL/hr over 30 Minutes Intravenous On call to O.R. 06/10/14 0952 06/10/14 1145      Alphonsa Overall, MD, FACS Pager: [facility] Office: [phone] 06/13/2014

## 2014-06-13 NOTE — Progress Notes (Signed)
Pt's PCA mechanism failed, reporting "unrecoverable error."  Unable to obtain pt's PCA morphine use ("received") for period between noon and 1600.  However, same PCA syringe used in replacement PCA; when this syringe replaced, pt will have received full syringe.  Pt reports that her PCA use during this time was similar to previous 4-hour period,

## 2014-06-14 LAB — CBC
HCT: 31 % — ABNORMAL LOW (ref 36.0–46.0)
Hemoglobin: 9.6 g/dL — ABNORMAL LOW (ref 12.0–15.0)
MCH: 25.7 pg — ABNORMAL LOW (ref 26.0–34.0)
MCHC: 31 g/dL (ref 30.0–36.0)
MCV: 83.1 fL (ref 78.0–100.0)
Platelets: 228 10*3/uL (ref 150–400)
RBC: 3.73 MIL/uL — ABNORMAL LOW (ref 3.87–5.11)
RDW: 14 % (ref 11.5–15.5)
WBC: 4.4 10*3/uL (ref 4.0–10.5)

## 2014-06-14 LAB — BASIC METABOLIC PANEL
Anion gap: 7 (ref 5–15)
BUN: <5 mg/dL — ABNORMAL LOW (ref 6–23)
CO2: 25 mmol/L (ref 19–32)
Calcium: 8.6 mg/dL (ref 8.4–10.5)
Chloride: 107 mmol/L (ref 96–112)
Creatinine, Ser: 0.69 mg/dL (ref 0.50–1.10)
GFR calc Af Amer: >90 mL/min (ref >90)
GFR calc non Af Amer: >90 mL/min (ref >90)
Glucose, Bld: 104 mg/dL — ABNORMAL HIGH (ref 70–99)
Potassium: 4.1 mmol/L (ref 3.5–5.1)
Sodium: 139 mmol/L (ref 135–145)

## 2014-06-14 NOTE — Progress Notes (Signed)
General Surgery Note  LOS: 4 days  POD -  4 Days Post-Op  Assessment/Plan: 1.  EXCISION OF LEFT RETROPERITONEAL MASS Barry Dienes - 06/10/2014  Path - benign lymphangioma (copy of path given to patient)  On clear liquids - no flatus - will leave on current diet until bowel function  2.  Chronic constipation - this predates her surgery and has been her major issue post op  3.  Schizoaffective disorder, bipolar  Geodon restarted (a side effect of Geodon is constipation) 4.  DVT prophylaxis - Lovenox 5.  Anemia, acute blood loss -   Hgb - 9.6 - 06/13/2014   Principal Problem:   Retroperitoneal mass s/p excision 06/10/2014 Active Problems:   Schizoaffective disorder, bipolar type   GAD (generalized anxiety disorder)   Bulimia nervosa   Alcohol dependence in remission  Subjective:  Somewhat emotional day.  Crying about bowels, though she had a BM yesterday with SMOG enema.  I stressed that she needs to ambulate.  Sister in room.  I answered questions about surgery as best I could. Objective:   Filed Vitals:   06/14/14 0621  BP: 108/62  Pulse: 62  Temp: 99.3 F (37.4 C)  Resp: 16     Intake/Output from previous day:  03/26 0701 - 03/27 0700 In: 5638 [P.O.:1740; I.V.:2400] Out: 7564 [Urine:3600; Drains:50; Stool:401]  Intake/Output this shift:      Physical Exam:   General: Thinner WN WF who is alert and oriented.    HEENT: Normal. Pupils equal. .   Lungs: Clear.     Abdomen: Mild distention.  I don't hear many bowel sounds.   Wound: Clean.      Drain - 50 recorded last 24 hours   Lab Results:     Recent Labs  06/13/14 0510 06/14/14 0532  WBC 3.6* 4.4  HGB 9.6* 9.6*  HCT 31.2* 31.0*  PLT 227 228    BMET    Recent Labs  06/13/14 0510 06/14/14 0532  NA 139 139  K 4.3 4.1  CL 106 107  CO2 27 25  GLUCOSE 94 104*  BUN <5* <5*  CREATININE 0.67 0.69  CALCIUM 8.7 8.6    PT/INR  No results for input(s): LABPROT, INR in the last 72 hours.  ABG  No results  for input(s): PHART, HCO3 in the last 72 hours.  Invalid input(s): PCO2, PO2   Studies/Results:  No results found.   Anti-infectives:   Anti-infectives    Start     Dose/Rate Route Frequency Ordered Stop   06/10/14 1800  ceFAZolin (ANCEF) IVPB 1 g/50 mL premix     1 g 100 mL/hr over 30 Minutes Intravenous Every 6 hours 06/10/14 1744 06/11/14 0920   06/10/14 0952  ceFAZolin (ANCEF) IVPB 2 g/50 mL premix     2 g 100 mL/hr over 30 Minutes Intravenous On call to O.R. 06/10/14 0952 06/10/14 1145      Cassandra Overall, MD, FACS Pager: [facility] Office: [phone] 06/14/2014

## 2014-06-14 NOTE — Progress Notes (Signed)
Pt called me in her room, she was crying saying she wanted to have another bowel movement. I offered her the SMOG enema but she refused. She did allow me to give her a suppository.I told her to call me when she was ready to use the [facility].

## 2014-06-14 NOTE — Progress Notes (Signed)
Pt stated that the Morphine pca doesent help her abd pain, and she hasn't not been using it  Anyway. She asks me if she could have something else for pain, I told her she had percocet ordered. I turned off PCA, and gave her 2 percocet per her request.

## 2014-06-14 NOTE — Progress Notes (Signed)
Pt called me to her room, she was crying saying that her belly felt full and it felt like she had to have a stool but when she got on the toilet nothing happened. She also c/o that she was cramping, and asked me if she could have something else to make her have a stool, but she didn't want any more liquid cause she already "felt full". I told her a suppository might help stimulate her rectum to help her go, and I could also checked to see if she was impacted. I notified MD on call, and he suggested I give her another SMOG enema, but pt refused. I also obtained order for a suppository and order I could attempt to disimpact her if necessary. Will continue to monitor.

## 2014-06-14 NOTE — Progress Notes (Signed)
Pt wanted me to check her for impaction. When I did so I did not feel any stool in her rectum. Pt stated she was belching and felt reflux coming from her stomach, and did not feel she was constipated from below but higher up. She also stated that she did not want to take the Miralax because it upset her stomach. Will continue to monitor.

## 2014-06-14 NOTE — Progress Notes (Signed)
Pt had small bowel movement and said she felt better, and wanted to wait on the suppository.

## 2014-06-15 LAB — BASIC METABOLIC PANEL
Anion gap: 6 (ref 5–15)
BUN: <5 mg/dL — ABNORMAL LOW (ref 6–23)
CO2: 25 mmol/L (ref 19–32)
Calcium: 8.7 mg/dL (ref 8.4–10.5)
Chloride: 109 mmol/L (ref 96–112)
Creatinine, Ser: 0.72 mg/dL (ref 0.50–1.10)
GFR calc Af Amer: >90 mL/min (ref >90)
GFR calc non Af Amer: >90 mL/min (ref >90)
Glucose, Bld: 101 mg/dL — ABNORMAL HIGH (ref 70–99)
Potassium: 4.4 mmol/L (ref 3.5–5.1)
Sodium: 140 mmol/L (ref 135–145)

## 2014-06-15 LAB — CBC
HCT: 30.1 % — ABNORMAL LOW (ref 36.0–46.0)
Hemoglobin: 9.3 g/dL — ABNORMAL LOW (ref 12.0–15.0)
MCH: 25.9 pg — ABNORMAL LOW (ref 26.0–34.0)
MCHC: 30.9 g/dL (ref 30.0–36.0)
MCV: 83.8 fL (ref 78.0–100.0)
Platelets: 235 10*3/uL (ref 150–400)
RBC: 3.59 MIL/uL — ABNORMAL LOW (ref 3.87–5.11)
RDW: 14.2 % (ref 11.5–15.5)
WBC: 3.3 10*3/uL — ABNORMAL LOW (ref 4.0–10.5)

## 2014-06-15 MED ORDER — CETYLPYRIDINIUM CHLORIDE 0.05 % MT LIQD
7.0000 mL | Freq: Two times a day (BID) | OROMUCOSAL | Status: DC
  Administered 2014-06-15: 7 mL via OROMUCOSAL

## 2014-06-15 NOTE — Progress Notes (Signed)
General Surgery Note  LOS: 5 days  POD -  5 Days Post-Op  Assessment/Plan: 1.  EXCISION OF LEFT RETROPERITONEAL MASS Barry Dienes - 06/10/2014  Path - benign lymphangioma (copy of path given to patient)  On clear liquids - no flatus but feels much better.  Will try a limited solid diet today  2.  Chronic constipation - this predates her surgery and has been her major issue post op  3.  Schizoaffective disorder, bipolar  Geodon restarted (a side effect of Geodon is constipation) 4.  DVT prophylaxis - Lovenox 5.  Anemia, acute blood loss -   Hgb - 9.6 - 06/13/2014   Principal Problem:   Retroperitoneal mass s/p excision 06/10/2014 Active Problems:   Schizoaffective disorder, bipolar type   GAD (generalized anxiety disorder)   Bulimia nervosa   Alcohol dependence in remission  Subjective: Pt states she is feeling much better today.  She has not passed any flatus but feels like things are moving through now.     Objective:   Filed Vitals:   06/15/14 0633  BP: 130/87  Pulse: 59  Temp: 98 F (36.7 C)  Resp: 16     Intake/Output from previous day:  03/27 0701 - 03/28 0700 In: 3600 [P.O.:1200; I.V.:2400] Out: 25 [Urine:3700; Drains:460; Stool:1]  Intake/Output this shift:  Total I/O In: -  Out: 800 [Urine:800]   Physical Exam:   General: Thinner WN WF who is alert and oriented.    HEENT: Normal. Pupils equal. .   Lungs: Clear.     Abdomen: Mild distention.    Wound: Clean.      Drain - clear fluid   Lab Results:     Recent Labs  06/14/14 0532 06/15/14 0450  WBC 4.4 3.3*  HGB 9.6* 9.3*  HCT 31.0* 30.1*  PLT 228 235    BMET    Recent Labs  06/14/14 0532 06/15/14 0450  NA 139 140  K 4.1 4.4  CL 107 109  CO2 25 25  GLUCOSE 104* 101*  BUN <5* <5*  CREATININE 0.69 0.72  CALCIUM 8.6 8.7    PT/INR  No results for input(s): LABPROT, INR in the last 72 hours.  ABG  No results for input(s): PHART, HCO3 in the last 72 hours.  Invalid input(s): PCO2,  PO2   Studies/Results:  No results found.   Anti-infectives:   Anti-infectives    Start     Dose/Rate Route Frequency Ordered Stop   06/10/14 1800  ceFAZolin (ANCEF) IVPB 1 g/50 mL premix     1 g 100 mL/hr over 30 Minutes Intravenous Every 6 hours 06/10/14 1744 06/11/14 0920   06/10/14 0952  ceFAZolin (ANCEF) IVPB 2 g/50 mL premix     2 g 100 mL/hr over 30 Minutes Intravenous On call to O.R. 06/10/14 0952 09/62/83 6629     Frantz Quattrone C Kaloni Bisaillon, MD  [facility] [place] [facility]

## 2014-06-15 NOTE — Progress Notes (Signed)
Pt not using her Morphine PCA  since Saturday day. Stated that it makes her "feel bad".  So it was turned off. Day nurse Sunday said she mentioned it to MD was not able to get an official order to discontinue it. Therefore the pump is still in the room with the med not being used.

## 2014-06-16 MED ORDER — OXYCODONE-ACETAMINOPHEN 5-325 MG PO TABS: 1.0000 | ORAL_TABLET | Freq: Four times a day (QID) | ORAL | Status: DC | PRN

## 2014-06-16 NOTE — Progress Notes (Signed)
Nurse reviewed discharge instructions with pt.  Pt verbalized understanding of discharge instructions, follow up appointments and new medications.  No concerns at time of discharge.  JP drain care teaching provided and pt demonstrated how to empty drain and recharge drain.  Pt aware of how to record drainage from JP drain and given a recording sheet along with care instructions. Prescription given at time of discharge.

## 2014-06-16 NOTE — Discharge Summary (Signed)
Physician Discharge Summary  Cassandra Cunningham YCX:[id number] DOB: [birth year] DOA: 06/10/2014  PCP: Benito Mccreedy, MD  Consultation: none  Admit date: 06/10/2014 Discharge date: 06/16/2014  Recommendations for Outpatient Follow-up:   Follow-up Information    Follow up with [facility], MD In 3 weeks.   Specialty:  General Surgery   Contact information:   [address] [place] [address] [phone]      Discharge Diagnoses:  1. Excision of left retroperitoneal mass 2. Chronic constipation 3. Schizoaffective disorder 4. ABL anemia 5. Anxiety disorder 6. Alcohol dependence, in remission   Surgical Procedure: excision of left retroperitoneal mass---Dr. Barry Dienes  Discharge Condition: stable Disposition: home  Diet recommendation: regular  Filed Weights   06/10/14 1022  Weight: 48.79 kg (107 lb 9 oz)       Hospital Course:  Cassandra Cunningham is a 44 year old female who underwent an elective excision of a left upper quadrant mass.  The patient tolerated the procedure well and was transferred to the floor.  She had issues with having a bowel movement and has chronic constipation.  She had several BMs during this admission.  She was started on her home medication.  Diet was advanced.  Her pathology was benign.  She remained stable. On POD#6 the patient was tolerating a diet, having BMs, ambulating, pain was controlled.  She wast therefore felt stable for discharge.  She was provided with #40 Rx of percocet.  I asked her to take miralax while on pain meds, however, she reports being allergic to this.  I then recommended otc stool softeners, high fiber diet and adequate oral hydration.  She was instructed on drain care and home health was established.  Medication risks, benefits and therapeutic alternatives were reviewed with the patient.  She verbalizes understanding. The patient knows to follow up with Dr. Barry Dienes in 2-3 weeks.  She was encouraged to call with questions or  concerns.     Physical Exam:  General appearance: alert and oriented. Calm and cooperative No acute distress. VSS. Afebrile.  Resp: clear to auscultation bilaterally  Cardio: S1S1 RRR without murmurs or gallops. No edema. GI: soft round and nontender. +BS x4 quadrants. No organomegaly, hernias or masses. Midline incision is healing well.  JP drain with serous output.   Pulses: +2 bilateral distal pulses without cyanosis  Psychiatric: calm and cooperative   Discharge Instructions     Medication List    STOP taking these medications        polyethylene glycol powder powder  Commonly known as:  MIRALAX      TAKE these medications        b complex vitamins capsule  Take 1 capsule by mouth every morning.     diphenhydrAMINE 25 mg capsule  Commonly known as:  BENADRYL  Take 1 capsule (25 mg total) by mouth every 6 (six) hours as needed for itching, allergies or sleep.     FLUoxetine 20 MG capsule  Commonly known as:  PROZAC  Take 1 capsule (20 mg total) by mouth daily.     multivitamin with minerals Tabs tablet  Take 1 tablet by mouth daily.     oxyCODONE-acetaminophen 5-325 MG per tablet  Commonly known as:  PERCOCET/ROXICET  Take 1-2 tablets by mouth every 6 (six) hours as needed for moderate pain.     potassium chloride 10 MEQ tablet  Commonly known as:  K-DUR  Take 10 mEq by mouth 2 (two) times daily.     risperiDONE  1 MG tablet  Commonly known as:  RISPERDAL  Take 1mg  po qAM and 2mg  po qPM     simethicone 80 MG chewable tablet  Commonly known as:  MYLICON  Chew 564 mg by mouth every 6 (six) hours as needed for flatulence.           Follow-up Information    Follow up with [facility], MD In 3 weeks.   Specialty:  General Surgery   Contact information:   [address] [place] [address] [phone]        The results of significant diagnostics from this hospitalization (including imaging, microbiology, ancillary and laboratory) are  listed below for reference.    Significant Diagnostic Studies: No results found.  Microbiology: No results found for this or any previous visit (from the past 240 hour(s)).   Labs: Basic Metabolic Panel:  Recent Labs Lab 06/11/14 0505 06/12/14 0445 06/13/14 0510 06/14/14 0532 06/15/14 0450  NA 132* 138 139 139 140  K 4.2 5.1 4.3 4.1 4.4  CL 100 107 106 107 109  CO2 25 26 27 25 25   GLUCOSE 104* 97 94 104* 101*  BUN 10 7 <5* <5* <5*  CREATININE 0.72 0.66 0.67 0.69 0.72  CALCIUM 8.6 8.5 8.7 8.6 8.7   Liver Function Tests: No results for input(s): AST, ALT, ALKPHOS, BILITOT, PROT, ALBUMIN in the last 168 hours. No results for input(s): LIPASE, AMYLASE in the last 168 hours. No results for input(s): AMMONIA in the last 168 hours. CBC:  Recent Labs Lab 06/11/14 0505 06/12/14 0445 06/13/14 0510 06/14/14 0532 06/15/14 0450  WBC 10.0 4.5 3.6* 4.4 3.3*  HGB 9.1* 9.3* 9.6* 9.6* 9.3*  HCT 29.2* 30.1* 31.2* 31.0* 30.1*  MCV 83.2 84.1 85.0 83.1 83.8  PLT 194 183 227 228 235   Cardiac Enzymes: No results for input(s): CKTOTAL, CKMB, CKMBINDEX, TROPONINI in the last 168 hours. BNP: BNP (last 3 results) No results for input(s): BNP in the last 8760 hours.  ProBNP (last 3 results) No results for input(s): PROBNP in the last 8760 hours.  CBG: No results for input(s): GLUCAP in the last 168 hours.  Principal Problem:   Retroperitoneal mass s/p excision 06/10/2014 Active Problems:   Schizoaffective disorder, bipolar type   GAD (generalized anxiety disorder)   Bulimia nervosa   Alcohol dependence in remission   Time coordinating discharge: <30 mins  Signed:  Mikaila Grunert, ANP-BC

## 2014-06-16 NOTE — Care Management Note (Signed)
    Page 1 of 1   06/16/2014     11:58:24 AM CARE MANAGEMENT NOTE 06/16/2014  Patient:  ADINE, HEIMANN R   Account Number:  [id number]  Date Initiated:  06/11/2014  Documentation initiated by:  Sunday Spillers  Subjective/Objective Assessment:   44 yo female admitted s/p excision of mass. PTA lived at home with alone.     Action/Plan:   home when stable   Anticipated DC Date:  06/15/2014   Anticipated DC Plan:  [place]  CM consult      [facility] Choice  HOME HEALTH   Choice offered to / List presented to:  C-1 Patient        [place] arranged  HH-1 RN  [place].   Status of service:  Completed, signed off Medicare Important Message given?   (If response is "NO", the following Medicare IM given date fields will be blank) Date Medicare IM given:   Medicare IM given by:   Date Additional Medicare IM given:   Additional Medicare IM given by:    Discharge Disposition:  [place]  Per UR Regulation:  Reviewed for med. necessity/level of care/duration of stay  If discussed at [place] of Stay Meetings, dates discussed:    Comments:  06-16-14 Sunday Spillers RN CM [id number] Recieved a referral to set up [facility] RN for drain care. Spoke with patient at bedside, no preference for [facility] agency. Contacted [facility] to provide [facility] services. Orders in place.

## 2014-07-07 ENCOUNTER — Ambulatory Visit (HOSPITAL_COMMUNITY): Payer: Self-pay | Admitting: Psychiatry

## 2014-08-20 ENCOUNTER — Emergency Department (HOSPITAL_COMMUNITY): Payer: Medicare Other

## 2014-08-20 ENCOUNTER — Emergency Department (HOSPITAL_COMMUNITY)
Admission: EM | Admit: 2014-08-20 | Discharge: 2014-08-20 | Disposition: A | Discharge status: Home / Self Care | Payer: Medicare Other | Attending: Emergency Medicine | Admitting: Emergency Medicine

## 2014-08-20 ENCOUNTER — Encounter (HOSPITAL_COMMUNITY): Payer: Self-pay | Admitting: Emergency Medicine

## 2014-08-20 DIAGNOSIS — Z9049 Acquired absence of other specified parts of digestive tract: Secondary | ICD-10-CM | POA: Insufficient documentation

## 2014-08-20 DIAGNOSIS — Z79899 Other long term (current) drug therapy: Secondary | ICD-10-CM | POA: Diagnosis not present

## 2014-08-20 DIAGNOSIS — F419 Anxiety disorder, unspecified: Secondary | ICD-10-CM | POA: Insufficient documentation

## 2014-08-20 DIAGNOSIS — Z9851 Tubal ligation status: Secondary | ICD-10-CM | POA: Diagnosis not present

## 2014-08-20 DIAGNOSIS — Z3202 Encounter for pregnancy test, result negative: Secondary | ICD-10-CM | POA: Diagnosis not present

## 2014-08-20 DIAGNOSIS — R109 Unspecified abdominal pain: Secondary | ICD-10-CM | POA: Diagnosis present

## 2014-08-20 DIAGNOSIS — Z8719 Personal history of other diseases of the digestive system: Secondary | ICD-10-CM | POA: Diagnosis not present

## 2014-08-20 DIAGNOSIS — Z87448 Personal history of other diseases of urinary system: Secondary | ICD-10-CM | POA: Diagnosis not present

## 2014-08-20 DIAGNOSIS — R1010 Upper abdominal pain, unspecified: Secondary | ICD-10-CM | POA: Insufficient documentation

## 2014-08-20 DIAGNOSIS — F329 Major depressive disorder, single episode, unspecified: Secondary | ICD-10-CM | POA: Diagnosis not present

## 2014-08-20 DIAGNOSIS — Z87891 Personal history of nicotine dependence: Secondary | ICD-10-CM | POA: Insufficient documentation

## 2014-08-20 LAB — CBC WITH DIFFERENTIAL/PLATELET
Basophils Absolute: 0.1 10*3/uL (ref 0.0–0.1)
Basophils Relative: 2 % — ABNORMAL HIGH (ref 0–1)
Eosinophils Absolute: 0.1 10*3/uL (ref 0.0–0.7)
Eosinophils Relative: 1 % (ref 0–5)
HCT: 32.2 % — ABNORMAL LOW (ref 36.0–46.0)
Hemoglobin: 9.9 g/dL — ABNORMAL LOW (ref 12.0–15.0)
Lymphocytes Relative: 30 % (ref 12–46)
Lymphs Abs: 1.2 10*3/uL (ref 0.7–4.0)
MCH: 25.3 pg — ABNORMAL LOW (ref 26.0–34.0)
MCHC: 30.7 g/dL (ref 30.0–36.0)
MCV: 82.4 fL (ref 78.0–100.0)
Monocytes Absolute: 0.3 10*3/uL (ref 0.1–1.0)
Monocytes Relative: 7 % (ref 3–12)
Neutro Abs: 2.4 10*3/uL (ref 1.7–7.7)
Neutrophils Relative %: 60 % (ref 43–77)
Platelets: 284 10*3/uL (ref 150–400)
RBC: 3.91 MIL/uL (ref 3.87–5.11)
RDW: 14.4 % (ref 11.5–15.5)
WBC: 4 10*3/uL (ref 4.0–10.5)

## 2014-08-20 LAB — COMPREHENSIVE METABOLIC PANEL
ALT: 24 U/L (ref 14–54)
AST: 25 U/L (ref 15–41)
Albumin: 4.1 g/dL (ref 3.5–5.0)
Alkaline Phosphatase: 49 U/L (ref 38–126)
Anion gap: 10 (ref 5–15)
BUN: 18 mg/dL (ref 6–20)
CO2: 24 mmol/L (ref 22–32)
Calcium: 8.9 mg/dL (ref 8.9–10.3)
Chloride: 102 mmol/L (ref 101–111)
Creatinine, Ser: 0.74 mg/dL (ref 0.44–1.00)
GFR calc Af Amer: >60 mL/min (ref >60)
GFR calc non Af Amer: >60 mL/min (ref >60)
Glucose, Bld: 86 mg/dL (ref 65–99)
Potassium: 3.9 mmol/L (ref 3.5–5.1)
Sodium: 136 mmol/L (ref 135–145)
Total Bilirubin: 0.5 mg/dL (ref 0.3–1.2)
Total Protein: 7.2 g/dL (ref 6.5–8.1)

## 2014-08-20 LAB — URINE MICROSCOPIC-ADD ON

## 2014-08-20 LAB — POC URINE PREG, ED: Preg Test, Ur: NEGATIVE

## 2014-08-20 LAB — URINALYSIS, ROUTINE W REFLEX MICROSCOPIC
Bilirubin Urine: NEGATIVE
Glucose, UA: NEGATIVE mg/dL
Ketones, ur: NEGATIVE mg/dL
Leukocytes, UA: NEGATIVE
Nitrite: NEGATIVE
Protein, ur: 30 mg/dL — AB
Specific Gravity, Urine: 1.006 (ref 1.005–1.030)
Urobilinogen, UA: 0.2 mg/dL (ref 0.0–1.0)
pH: 7 (ref 5.0–8.0)

## 2014-08-20 LAB — LIPASE, BLOOD: Lipase: 37 U/L (ref 22–51)

## 2014-08-20 MED ORDER — METHOCARBAMOL 500 MG PO TABS
500.0000 mg | ORAL_TABLET | Freq: Two times a day (BID) | ORAL | Status: DC
Start: 2014-08-20 — End: 2014-11-08

## 2014-08-20 MED ORDER — KETOROLAC TROMETHAMINE 30 MG/ML IJ SOLN
30.0000 mg | Freq: Once | INTRAMUSCULAR | Status: AC
  Administered 2014-08-20: 30 mg via INTRAVENOUS
  Filled 2014-08-20: qty 1

## 2014-08-20 MED ORDER — TRAMADOL HCL 50 MG PO TABS: 50.0000 mg | ORAL_TABLET | Freq: Four times a day (QID) | ORAL | Status: DC | PRN

## 2014-08-20 NOTE — ED Notes (Signed)
Pt stating pain has gotten worse.  Made Tatyana PA aware.

## 2014-08-20 NOTE — ED Notes (Signed)
Patient transported to CT 

## 2014-08-20 NOTE — ED Notes (Signed)
Pt returned from CT °

## 2014-08-20 NOTE — ED Notes (Signed)
Nurse currently drawing labs 

## 2014-08-20 NOTE — ED Provider Notes (Signed)
CSN: [id number]     Arrival date & time 08/20/14  7017 History   First MD Initiated Contact with Patient 08/20/14 1013     Chief Complaint  Patient presents with  . Abdominal Pain     (Consider location/radiation/quality/duration/timing/severity/associated sxs/prior Treatment) HPI Cassandra Cunningham is a 44 y.o. female with history of pyelonephritis, history kidney stones, alcohol abuse, eating disorder, depression, schizophrenia, presents to emergency department with complaint of abdominal pain. Patient states her abdominal pain started yesterday. Pain began up. Umbilical area radiating to the right upper abdomen and right flank. Patient with appendectomy 6 months ago and removal of retroperitoneal benign lymphangioma 3 months ago. She states she had a normal bowel movement today, she denies any nausea or vomiting. Pain is not worsened with eating. She denies any urinary frequency, urgency, dysuria. She denies any fever or chills. Denies any vaginal complaints. Patient denies history of similar pain in the past.  Past Medical History  Diagnosis Date  . Kidney infection   . Hemorrhoids     bleeding  . Depression   . Anxiety   . Eating disorder     may be an ongoing issue  . Bipolar disorder   . Kidney stones   . Alcohol abuse   . Bradycardia     history of slow heart rate  . Schizophrenia     BH visit Dr. Volanda Napoleon- 04-28-14 " feeling things and hearing things"- tx. meds has given some improvement- next monthly visit April 2015.  Marland Kitchen Acute appendicitis 04/14/2014  . MDD (major depressive disorder) 12/25/2012   Past Surgical History  Procedure Laterality Date  . Tubal ligation    . Cesarean section    . [place] teeth    . Laparoscopic appendectomy N/A 04/14/2014    Procedure: APPENDECTOMY LAPAROSCOPIC;  Surgeon: Stark Klein, MD;  Location: [facility] ORS;  Service: General;  Laterality: N/A;  . Appendectomy      1'16  . Incision and drainage of wound Left 06/10/2014    Procedure: EXCISION OF LEFT  RETROPERITONEAL MASS;  Surgeon: Stark Klein, MD;  Location: [facility] ORS;  Service: General;  Laterality: Left;   Family History  Problem Relation Age of Onset  . Mental illness Mother   . Heart disease Father   . Depression Father   . Alcohol abuse Father   . Diabetes Sister   . Bipolar disorder Sister   . Diabetes Brother   . Bipolar disorder Brother   . Heart disease Paternal Grandfather   . Eating disorder Father   . Suicidality Paternal Uncle    History  Substance Use Topics  . Smoking status: Former Smoker    Quit date: 12/19/2007  . Smokeless tobacco: Never Used  . Alcohol Use: Yes     Comment: Quit. Hx of alcohol dependence-now reports that she only drinks etoh occasionally (a few times a year)   OB History    No data available     Review of Systems  Constitutional: Negative for fever and chills.  Respiratory: Negative for cough, chest tightness and shortness of breath.   Cardiovascular: Negative for chest pain, palpitations and leg swelling.  Gastrointestinal: Positive for abdominal pain. Negative for nausea, vomiting and diarrhea.  Genitourinary: Positive for flank pain. Negative for dysuria, frequency, hematuria, vaginal bleeding, vaginal discharge, vaginal pain and pelvic pain.  Musculoskeletal: Negative for myalgias, arthralgias, neck pain and neck stiffness.  Skin: Negative for rash.  Neurological: Negative for dizziness, weakness and headaches.  All other systems reviewed and  are negative.     Allergies  Review of patient's allergies indicates no known allergies.  Home Medications   Prior to Admission medications   Medication Sig Start Date End Date Taking? Authorizing Provider  LINZESS 145 MCG CAPS capsule Take 145 mcg by mouth daily. 08/13/14  Yes Historical Provider, MD  Multiple Vitamin (MULTIVITAMIN WITH MINERALS) TABS tablet Take 1 tablet by mouth daily.   Yes Historical Provider, MD  potassium chloride (K-DUR) 10 MEQ tablet Take 10 mEq by mouth 2  (two) times daily.   Yes Historical Provider, MD  simethicone (MYLICON) 80 MG chewable tablet Chew 160 mg by mouth every 6 (six) hours as needed for flatulence.   Yes Historical Provider, MD  diphenhydrAMINE (BENADRYL) 25 mg capsule Take 1 capsule (25 mg total) by mouth every 6 (six) hours as needed for itching, allergies or sleep. Patient not taking: Reported on 05/05/2014 04/15/14   Nat Christen, PA-C  FLUoxetine (PROZAC) 20 MG capsule Take 1 capsule (20 mg total) by mouth daily. Patient not taking: Reported on 08/20/2014 06/11/14 06/11/15  Charlcie Cradle, MD  oxyCODONE-acetaminophen (PERCOCET/ROXICET) 5-325 MG per tablet Take 1-2 tablets by mouth every 6 (six) hours as needed for moderate pain. Patient not taking: Reported on 08/20/2014 06/16/14   Erby Pian, NP  risperiDONE (RISPERDAL) 1 MG tablet Take 1mg  po qAM and 2mg  po qPM Patient not taking: Reported on 08/20/2014 06/11/14   Charlcie Cradle, MD   BP 109/71 mmHg  Pulse 48  Temp(Src) 98.1 F (36.7 C) (Oral)  Resp 13  SpO2 100%  LMP 08/17/2014 Physical Exam  Constitutional: She is oriented to person, place, and time. She appears well-developed and well-nourished. No distress.  HENT:  Head: Normocephalic.  Eyes: Conjunctivae are normal.  Neck: Neck supple.  Cardiovascular: Normal rate, regular rhythm and normal heart sounds.   Pulmonary/Chest: Effort normal and breath sounds normal. No respiratory distress. She has no wheezes. She has no rales.  Abdominal: Soft. Bowel sounds are normal. She exhibits no distension. There is tenderness. There is no rebound.  Right tibia tenderness. Tender to palpation in right upper abdomen. Abdomen soft, no peritoneal signs, no guarding  Musculoskeletal: She exhibits no edema.  Neurological: She is alert and oriented to person, place, and time.  Skin: Skin is warm and dry.  Psychiatric: She has a normal mood and affect. Her behavior is normal.  Nursing note and vitals reviewed.   ED Course  Procedures  (including critical care time) Labs Review Labs Reviewed  CBC WITH DIFFERENTIAL/PLATELET - Abnormal; Notable for the following:    Hemoglobin 9.9 (*)    HCT 32.2 (*)    MCH 25.3 (*)    Basophils Relative 2 (*)    All other components within normal limits  URINALYSIS, ROUTINE W REFLEX MICROSCOPIC (NOT AT [facility]) - Abnormal; Notable for the following:    Color, Urine RED (*)    Hgb urine dipstick LARGE (*)    Protein, ur 30 (*)    All other components within normal limits  COMPREHENSIVE METABOLIC PANEL  LIPASE, BLOOD  URINE MICROSCOPIC-ADD ON  POC URINE PREG, ED    Imaging Review Ct Abdomen Pelvis Wo Contrast  08/20/2014   CLINICAL DATA:  Mid abdominal and right upper quadrant abdominal pain. Evaluate for kidney stone. History of removal of multiloculated fluid collection from the left upper quadrant in March of this year.  EXAM: CT ABDOMEN AND PELVIS WITHOUT CONTRAST  TECHNIQUE: Multidetector CT imaging of the abdomen and pelvis was  performed following the standard protocol without IV contrast.  COMPARISON:  CT abdomen pelvis - 05/15/2014; 04/14/2014  FINDINGS: The lack of intravenous contrast limits the ability to evaluate solid abdominal organs.  There has been interval complete surgical removal of previously noted loculated cystic mass within the left upper abdominal quadrant without evidence of residual or recurrent disease on this noncontrast examination.  Normal noncontrast appearance of the bilateral kidneys. No renal stones. No renal stones are seen along the expected course of either ureter or the urinary bladder. No evidence urinary obstruction.  Normal hepatic contour. Normal noncontrast appearance of the gallbladder. No radiopaque gallstones. No ascites.  Normal noncontrast appearance of the bilateral adrenal glands, pancreas and spleen.  Moderate colonic stool burden without evidence of enteric obstruction. The appendix is not visualized compatible with provided history. No  pneumoperitoneum, pneumatosis or portal venous gas.  Normal caliber of the abdominal aorta.  No definitive bulky retroperitoneal, mesenteric, pelvic or inguinal lymphadenopathy.  Normal noncontrast appearance of the pelvic organs. No free fluid in the pelvic cul-de-sac.  Limited visualization of lower thorax demonstrates minimal dependent subpleural ground-glass atelectasis. No discrete focal airspace opacities. No pleural effusion.  Normal heart size. There is mild diffuse decreased attenuation of the intra cardiac blood pool suggestive of anemia.  No acute or aggressive osseous abnormalities.  Well-healed midline ventral upper abdominal wall incision. Regional soft tissues appear otherwise normal.  IMPRESSION: 1. No definite explanation for patient's right upper quadrant and mid abdominal pain. Specifically, no evidence of nephrolithiasis, urinary or enteric obstruction. 2. Post resection of previously noted loculated cystic mass (proven to be a benign cystic lymphangioma) within the left upper abdominal quadrant without evidence of residual disease or recurrence.   Electronically Signed   By: Sandi Mariscal M.D.   On: 08/20/2014 13:45     EKG Interpretation None      MDM   Final diagnoses:  Pain of upper abdomen     patient emerged from the right flank and right upper quadrant pain. She does not appear to be in any distress. She does not want any medications. Will start with urinalysis, lab work. Will reassess.  3:17 PM Patient continued to have pain, labs unremarkable except for anemia which she is aware off. CT of abdomen and pelvis without contrast was obtained to rule out a kidney stone given history of the same. Urinalysis no signs of infection. CT came back unremarkable. Patient states pain worsened when she started moving around for CT scan. Question whether this could be musculoskeletal pain. Blood discharge home with a muscle relaxant, cultural, follow-up with primary care doctor. At this  time no evidence of infection. Abdomen is benign. She is stable for discharge home.  Filed Vitals:   08/20/14 1005 08/20/14 1412  BP: 109/71 99/66  Pulse: 48 50  Temp: 98.1 F (36.7 C)   TempSrc: Oral   Resp: 13 15  SpO2: 100% 100%     Jeannett Senior, PA-C 08/20/14 [place], MD 08/20/14 1614

## 2014-08-20 NOTE — Discharge Instructions (Signed)
Ultram as prescribed as needed for pain. Robaxin for spasms. Rest. Heating pads. Follow up with your doctor for recheck. Return if pain is worsening.    Abdominal Pain Many things can cause abdominal pain. Usually, abdominal pain is not caused by a disease and will improve without treatment. It can often be observed and treated at home. Your health care provider will do a physical exam and possibly order blood tests and X-rays to help determine the seriousness of your pain. However, in many cases, more time must pass before a clear cause of the pain can be found. Before that point, your health care provider may not know if you need more testing or further treatment. HOME CARE INSTRUCTIONS  Monitor your abdominal pain for any changes. The following actions may help to alleviate any discomfort you are experiencing:  Only take over-the-counter or prescription medicines as directed by your health care provider.  Do not take laxatives unless directed to do so by your health care provider.  Try a clear liquid diet (broth, tea, or water) as directed by your health care provider. Slowly move to a bland diet as tolerated. SEEK MEDICAL CARE IF:  You have unexplained abdominal pain.  You have abdominal pain associated with nausea or diarrhea.  You have pain when you urinate or have a bowel movement.  You experience abdominal pain that wakes you in the night.  You have abdominal pain that is worsened or improved by eating food.  You have abdominal pain that is worsened with eating fatty foods.  You have a fever. SEEK IMMEDIATE MEDICAL CARE IF:   Your pain does not go away within 2 hours.  You keep throwing up (vomiting).  Your pain is felt only in portions of the abdomen, such as the right side or the left lower portion of the abdomen.  You pass bloody or black tarry stools. MAKE SURE YOU:  Understand these instructions.   Will watch your condition.   Will get help right away if you  are not doing well or get worse.  Document Released: 12/14/2004 Document Revised: 03/11/2013 Document Reviewed: 11/13/2012 [facility] Patient Information 2015 [place], [place]. This information is not intended to replace advice given to you by your health care provider. Make sure you discuss any questions you have with your health care provider.

## 2014-08-20 NOTE — ED Notes (Signed)
Pt states that yesterday started having mid abd pain without and n/v/d.  Pt c/o pain in RUQ now without and n/v/d.  Pt states that she takes medications to help with BM, so she denies constipation.  Pt had appendix removed Apr 14, 2014 and had mass in abd that was removed in March 2016.  Pt denies anything that makes her pain worse or better.

## 2014-09-28 ENCOUNTER — Telehealth (HOSPITAL_COMMUNITY): Payer: Self-pay

## 2014-10-07 ENCOUNTER — Ambulatory Visit (INDEPENDENT_AMBULATORY_CARE_PROVIDER_SITE_OTHER): Payer: Medicare Other | Admitting: Psychiatry

## 2014-10-07 ENCOUNTER — Encounter (HOSPITAL_COMMUNITY): Payer: Self-pay | Admitting: Psychiatry

## 2014-10-07 VITALS — BP 96/62 | HR 50 | Ht 66.5 in | Wt 112.6 lb

## 2014-10-07 DIAGNOSIS — F250 Schizoaffective disorder, bipolar type: Secondary | ICD-10-CM

## 2014-10-07 DIAGNOSIS — F251 Schizoaffective disorder, depressive type: Secondary | ICD-10-CM | POA: Diagnosis not present

## 2014-10-07 MED ORDER — FLUOXETINE HCL 20 MG PO CAPS: 20.0000 mg | ORAL_CAPSULE | Freq: Every day | ORAL | Status: DC

## 2014-10-07 MED ORDER — RISPERIDONE 1 MG PO TABS: ORAL_TABLET | ORAL | Status: DC
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
## 2014-10-12 ENCOUNTER — Telehealth (HOSPITAL_COMMUNITY): Payer: Self-pay | Admitting: *Deleted

## 2014-10-12 NOTE — Telephone Encounter (Signed)
Prior authorization for Risperidone 1mg  received. Called spoke with Cassandra Cunningham who gave approval from 03/20/14-03/20/15. Approval letter will be faxed to the office but they did not have an approval # to give over the phone.

## 2014-10-15 ENCOUNTER — Telehealth (HOSPITAL_COMMUNITY): Payer: Self-pay

## 2014-10-15 NOTE — Telephone Encounter (Signed)
Attempted to reach patient back after she left a message earlier this date reporting she wanted to different antidepressant and was stopping her Prozac.  Patient stated she felt more depressed and anxious with the Prozac and wanted something else to help with anxiety.  Patient also stated on her message she was not taking Risperdal and wanted to return to taking Geodon.  Left patient a message to call back to discuss what she is experiencing and to make sure provider has all her concerns as she is requesting a new antidepressant and restart of Geodon at this time.

## 2014-11-05 ENCOUNTER — Encounter (HOSPITAL_COMMUNITY): Payer: Self-pay | Admitting: Emergency Medicine

## 2014-11-05 ENCOUNTER — Emergency Department (HOSPITAL_COMMUNITY)
Admission: EM | Admit: 2014-11-05 | Discharge: 2014-11-05 | Disposition: A | Discharge status: Home / Self Care | Payer: Medicare Other

## 2014-11-05 ENCOUNTER — Emergency Department (HOSPITAL_COMMUNITY)
Admission: EM | Admit: 2014-11-05 | Discharge: 2014-11-05 | Disposition: A | Discharge status: Home / Self Care | Payer: Medicare Other | Attending: Emergency Medicine | Admitting: Emergency Medicine

## 2014-11-05 ENCOUNTER — Emergency Department (HOSPITAL_COMMUNITY): Payer: Medicare Other

## 2014-11-05 DIAGNOSIS — Y9389 Activity, other specified: Secondary | ICD-10-CM | POA: Diagnosis not present

## 2014-11-05 DIAGNOSIS — F419 Anxiety disorder, unspecified: Secondary | ICD-10-CM | POA: Diagnosis not present

## 2014-11-05 DIAGNOSIS — Z87448 Personal history of other diseases of urinary system: Secondary | ICD-10-CM | POA: Diagnosis not present

## 2014-11-05 DIAGNOSIS — S80212A Abrasion, left knee, initial encounter: Secondary | ICD-10-CM

## 2014-11-05 DIAGNOSIS — S199XXA Unspecified injury of neck, initial encounter: Secondary | ICD-10-CM | POA: Diagnosis not present

## 2014-11-05 DIAGNOSIS — S40212A Abrasion of left shoulder, initial encounter: Secondary | ICD-10-CM | POA: Insufficient documentation

## 2014-11-05 DIAGNOSIS — Z87442 Personal history of urinary calculi: Secondary | ICD-10-CM | POA: Insufficient documentation

## 2014-11-05 DIAGNOSIS — S8992XA Unspecified injury of left lower leg, initial encounter: Secondary | ICD-10-CM | POA: Diagnosis present

## 2014-11-05 DIAGNOSIS — S0993XA Unspecified injury of face, initial encounter: Secondary | ICD-10-CM

## 2014-11-05 DIAGNOSIS — Y999 Unspecified external cause status: Secondary | ICD-10-CM | POA: Insufficient documentation

## 2014-11-05 DIAGNOSIS — F319 Bipolar disorder, unspecified: Secondary | ICD-10-CM | POA: Diagnosis not present

## 2014-11-05 DIAGNOSIS — Z23 Encounter for immunization: Secondary | ICD-10-CM | POA: Insufficient documentation

## 2014-11-05 DIAGNOSIS — S99922A Unspecified injury of left foot, initial encounter: Secondary | ICD-10-CM | POA: Diagnosis not present

## 2014-11-05 DIAGNOSIS — Z87891 Personal history of nicotine dependence: Secondary | ICD-10-CM | POA: Insufficient documentation

## 2014-11-05 DIAGNOSIS — F209 Schizophrenia, unspecified: Secondary | ICD-10-CM | POA: Insufficient documentation

## 2014-11-05 DIAGNOSIS — S00531A Contusion of lip, initial encounter: Secondary | ICD-10-CM

## 2014-11-05 DIAGNOSIS — Z8719 Personal history of other diseases of the digestive system: Secondary | ICD-10-CM | POA: Insufficient documentation

## 2014-11-05 DIAGNOSIS — Y92410 Unspecified street and highway as the place of occurrence of the external cause: Secondary | ICD-10-CM | POA: Diagnosis not present

## 2014-11-05 DIAGNOSIS — Z79899 Other long term (current) drug therapy: Secondary | ICD-10-CM | POA: Insufficient documentation

## 2014-11-05 LAB — I-STAT BETA HCG BLOOD, ED (MC, WL, AP ONLY): I-stat hCG, quantitative: <5 m[IU]/mL (ref <5)

## 2014-11-05 MED ORDER — ONDANSETRON HCL 4 MG/2ML IJ SOLN
4.0000 mg | Freq: Once | INTRAMUSCULAR | Status: AC
  Administered 2014-11-05: 4 mg via INTRAVENOUS
  Filled 2014-11-05: qty 2

## 2014-11-05 MED ORDER — HYDROCODONE-HOMATROPINE 5-1.5 MG/5ML PO SYRP: 5.0000 mL | ORAL_SOLUTION | Freq: Four times a day (QID) | ORAL | Status: DC | PRN

## 2014-11-05 MED ORDER — HYDROMORPHONE HCL 1 MG/ML IJ SOLN
1.0000 mg | Freq: Once | INTRAMUSCULAR | Status: AC
  Administered 2014-11-05: 1 mg via INTRAVENOUS
  Filled 2014-11-05: qty 1

## 2014-11-05 MED ORDER — HYDROCODONE-ACETAMINOPHEN 5-325 MG PO TABS: 1.0000 | ORAL_TABLET | ORAL | Status: DC | PRN

## 2014-11-05 MED ORDER — SODIUM CHLORIDE 0.9 % IV BOLUS (SEPSIS)
1000.0000 mL | Freq: Once | INTRAVENOUS | Status: AC
  Administered 2014-11-05: 1000 mL via INTRAVENOUS

## 2014-11-05 MED ORDER — HYDROMORPHONE HCL 1 MG/ML IJ SOLN
1.0000 mg | Freq: Once | INTRAMUSCULAR | Status: AC
Start: 2014-11-05 — End: 2014-11-05
  Administered 2014-11-05: 1 mg via INTRAVENOUS
  Filled 2014-11-05: qty 1

## 2014-11-05 MED ORDER — TETANUS-DIPHTH-ACELL PERTUSSIS 5-2.5-18.5 LF-MCG/0.5 IM SUSP
0.5000 mL | Freq: Once | INTRAMUSCULAR | Status: AC
  Administered 2014-11-05: 0.5 mL via INTRAMUSCULAR
  Filled 2014-11-05: qty 0.5

## 2014-11-05 NOTE — ED Notes (Addendum)
Pt states she has not eaten this morning but did have morning coffee with milk in it. She is unable to close her mouth, not able to speak. Blood pooling in front of mouth, mouth suctioned.

## 2014-11-05 NOTE — ED Notes (Signed)
Left knee irrigated with sterile saline and cleaned. Abrasion to left knee covered with bacitracin and non-adhesive telfa dressing, then covered with gauze wrap.

## 2014-11-05 NOTE — Care Management (Addendum)
ED CM received call from patient regarding prescription patient received in the ED today for hycodan not being covered by her insurance.  Patient requesting tablet form. CM spoke with EDP prescription written for tablets patient made aware to return prescription to [facility] ED  To switch out for new prescription. CM received old prescription and shredded. No further CM needs identified.

## 2014-11-05 NOTE — Discharge Instructions (Signed)
Please follow up with your primary care physician in 1-2 days. If you do not have one please call the [place] number listed above. Please follow up with Dr. Ashley Mariner to schedule a follow up appointment.  Please follow a soft food diet for at least one week. Please keep all abrasions clean, dry, and covered. Please read all discharge instructions and return precautions.   Facial or Scalp Contusion A facial or scalp contusion is a deep bruise on the face or head. Injuries to the face and head generally cause a lot of swelling, especially around the eyes. Contusions are the result of an injury that caused bleeding under the skin. The contusion may turn blue, purple, or yellow. Minor injuries will give you a painless contusion, but more severe contusions may stay painful and swollen for a few weeks.  CAUSES  A facial or scalp contusion is caused by a blunt injury or trauma to the face or head area.  SIGNS AND SYMPTOMS   Swelling of the injured area.   Discoloration of the injured area.   Tenderness, soreness, or pain in the injured area.  DIAGNOSIS  The diagnosis can be made by taking a medical history and doing a physical exam. An X-ray exam, CT scan, or MRI may be needed to determine if there are any associated injuries, such as broken bones (fractures). TREATMENT  Often, the best treatment for a facial or scalp contusion is applying cold compresses to the injured area. Over-the-counter medicines may also be recommended for pain control.  HOME CARE INSTRUCTIONS   Only take over-the-counter or prescription medicines as directed by your health care provider.   Apply ice to the injured area.   Put ice in a plastic bag.   Place a towel between your skin and the bag.   Leave the ice on for 20 minutes, 2-3 times a day.  SEEK MEDICAL CARE IF:  You have bite problems.   You have pain with chewing.   You are concerned about facial defects. SEEK IMMEDIATE  MEDICAL CARE IF:  You have severe pain or a headache that is not relieved by medicine.   You have unusual sleepiness, confusion, or personality changes.   You throw up (vomit).   You have a persistent nosebleed.   You have double vision or blurred vision.   You have fluid drainage from your nose or ear.   You have difficulty walking or using your arms or legs.  MAKE SURE YOU:   Understand these instructions.  Will watch your condition.  Will get help right away if you are not doing well or get worse. Document Released: 04/13/2004 Document Revised: 12/25/2012 Document Reviewed: 10/17/2012 [facility] Patient Information 2015 [place], [place]. This information is not intended to replace advice given to you by your health care provider. Make sure you discuss any questions you have with your health care provider. Mandibular Contusion A mandibular contusion is a deep bruise of your jaw. Contusions are the result of an injury that caused bleeding under the skin. The contusion may turn blue, purple, or yellow. Minor injuries will give you a painless contusion, but more severe contusions may stay painful and swollen for a few weeks.  CAUSES A mandibular contusion comes from a direct force to that area, such as falling or a punch to the jaw. SYMPTOMS   Jaw pain.  Jaw swelling.  Jaw bruising.  Jaw tenderness. DIAGNOSIS  The diagnosis can be made by taking your history and  doing a physical exam. You may need an X-ray of your jaw to look for a broken bone (fracture). TREATMENT Often, the best treatment for a mandibular contusion is applying cold compresses to the injured area and eating a soft diet. Over-the-counter medicines may also be recommended for pain control.  HOME CARE INSTRUCTIONS   Put ice on the injured area.  Put ice in a plastic bag.  Place a towel between your skin and the bag.  Leave the ice on for 15-20 minutes, 03-04 times a day.  Eat soft foods for 1 week.  Soft foods include baby food, gelatin, cooked cereal, ice cream, applesauce, bananas, eggs, pasta, cottage cheese, soups, and yogurt. Cut food into smaller pieces for less chewing. Avoid chewing gum or ice.  Only take over-the-counter or prescription medicines for pain, discomfort, or fever as directed by your caregiver.  Avoid opening your mouth widely. This includes opening your mouth to eat large pieces of food or to yawn, scream, yell, or sing. SEEK IMMEDIATE MEDICAL CARE IF:   Your swelling or pain is not relieved with medicines.  You are not improving.  You have any cracking or clicking (crepitation) in the jaw joint. MAKE SURE YOU:   Understand these instructions.  Will watch your condition.  Will get help right away if you are not doing well or get worse. Document Released: 05/27/2003 Document Revised: 05/29/2011 Document Reviewed: 01/20/2011 [facility] Patient Information 2015 [place], [place]. This information is not intended to replace advice given to you by your health care provider. Make sure you discuss any questions you have with your health care provider.
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
## 2014-11-08 ENCOUNTER — Emergency Department (HOSPITAL_COMMUNITY)
Admission: EM | Admit: 2014-11-08 | Discharge: 2014-11-08 | Disposition: A | Discharge status: Home / Self Care | Payer: Medicare Other | Attending: Emergency Medicine | Admitting: Emergency Medicine

## 2014-11-08 ENCOUNTER — Encounter (HOSPITAL_COMMUNITY): Payer: Self-pay | Admitting: Emergency Medicine

## 2014-11-08 DIAGNOSIS — Z8719 Personal history of other diseases of the digestive system: Secondary | ICD-10-CM | POA: Insufficient documentation

## 2014-11-08 DIAGNOSIS — F319 Bipolar disorder, unspecified: Secondary | ICD-10-CM | POA: Insufficient documentation

## 2014-11-08 DIAGNOSIS — Z87891 Personal history of nicotine dependence: Secondary | ICD-10-CM | POA: Diagnosis not present

## 2014-11-08 DIAGNOSIS — Z87442 Personal history of urinary calculi: Secondary | ICD-10-CM | POA: Diagnosis not present

## 2014-11-08 DIAGNOSIS — S01511D Laceration without foreign body of lip, subsequent encounter: Secondary | ICD-10-CM | POA: Diagnosis not present

## 2014-11-08 DIAGNOSIS — Z87448 Personal history of other diseases of urinary system: Secondary | ICD-10-CM | POA: Insufficient documentation

## 2014-11-08 DIAGNOSIS — S0993XD Unspecified injury of face, subsequent encounter: Secondary | ICD-10-CM | POA: Diagnosis present

## 2014-11-08 DIAGNOSIS — F419 Anxiety disorder, unspecified: Secondary | ICD-10-CM | POA: Diagnosis not present

## 2014-11-08 DIAGNOSIS — R519 Headache, unspecified: Secondary | ICD-10-CM

## 2014-11-08 DIAGNOSIS — R51 Headache: Secondary | ICD-10-CM

## 2014-11-08 DIAGNOSIS — S01512D Laceration without foreign body of oral cavity, subsequent encounter: Secondary | ICD-10-CM

## 2014-11-08 DIAGNOSIS — Z79899 Other long term (current) drug therapy: Secondary | ICD-10-CM | POA: Insufficient documentation

## 2014-11-08 MED ORDER — TRAMADOL HCL 50 MG PO TABS: 50.0000 mg | ORAL_TABLET | Freq: Four times a day (QID) | ORAL | Status: DC | PRN
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
## 2014-11-08 NOTE — Discharge Instructions (Signed)
1. Medications: Tramadol, usual home medications 2. Treatment: rest, drink plenty of fluids,  3. Follow Up: Please followup with ENT 2 days for discussion of your diagnoses and further evaluation after today's visit;
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
## 2014-11-21 ENCOUNTER — Encounter (HOSPITAL_COMMUNITY): Payer: Self-pay | Admitting: Emergency Medicine

## 2014-11-21 ENCOUNTER — Emergency Department (HOSPITAL_COMMUNITY)
Admission: EM | Admit: 2014-11-21 | Discharge: 2014-11-21 | Disposition: A | Discharge status: Home / Self Care | Payer: Medicare Other | Attending: Emergency Medicine | Admitting: Emergency Medicine

## 2014-11-21 DIAGNOSIS — Z87891 Personal history of nicotine dependence: Secondary | ICD-10-CM | POA: Diagnosis not present

## 2014-11-21 DIAGNOSIS — Z8719 Personal history of other diseases of the digestive system: Secondary | ICD-10-CM | POA: Diagnosis not present

## 2014-11-21 DIAGNOSIS — F329 Major depressive disorder, single episode, unspecified: Secondary | ICD-10-CM | POA: Diagnosis not present

## 2014-11-21 DIAGNOSIS — F419 Anxiety disorder, unspecified: Secondary | ICD-10-CM | POA: Insufficient documentation

## 2014-11-21 DIAGNOSIS — Z87442 Personal history of urinary calculi: Secondary | ICD-10-CM | POA: Insufficient documentation

## 2014-11-21 DIAGNOSIS — L0201 Cutaneous abscess of face: Secondary | ICD-10-CM

## 2014-11-21 DIAGNOSIS — L089 Local infection of the skin and subcutaneous tissue, unspecified: Secondary | ICD-10-CM | POA: Diagnosis present

## 2014-11-21 DIAGNOSIS — Z79899 Other long term (current) drug therapy: Secondary | ICD-10-CM | POA: Insufficient documentation

## 2014-11-21 DIAGNOSIS — Z87448 Personal history of other diseases of urinary system: Secondary | ICD-10-CM | POA: Diagnosis not present

## 2014-11-21 LAB — BASIC METABOLIC PANEL
Anion gap: 6 (ref 5–15)
BUN: 9 mg/dL (ref 6–20)
CO2: 28 mmol/L (ref 22–32)
Calcium: 9.1 mg/dL (ref 8.9–10.3)
Chloride: 103 mmol/L (ref 101–111)
Creatinine, Ser: 0.63 mg/dL (ref 0.44–1.00)
GFR calc Af Amer: >60 mL/min (ref >60)
GFR calc non Af Amer: >60 mL/min (ref >60)
Glucose, Bld: 71 mg/dL (ref 65–99)
Potassium: 3.4 mmol/L — ABNORMAL LOW (ref 3.5–5.1)
Sodium: 137 mmol/L (ref 135–145)

## 2014-11-21 LAB — URINALYSIS, ROUTINE W REFLEX MICROSCOPIC
Bilirubin Urine: NEGATIVE
Glucose, UA: NEGATIVE mg/dL
Hgb urine dipstick: NEGATIVE
Ketones, ur: NEGATIVE mg/dL
Leukocytes, UA: NEGATIVE
Nitrite: NEGATIVE
Protein, ur: NEGATIVE mg/dL
Specific Gravity, Urine: 1.005 (ref 1.005–1.030)
Urobilinogen, UA: 0.2 mg/dL (ref 0.0–1.0)
pH: 6.5 (ref 5.0–8.0)

## 2014-11-21 LAB — CBC
HCT: 37.9 % (ref 36.0–46.0)
Hemoglobin: 12.3 g/dL (ref 12.0–15.0)
MCH: 28.9 pg (ref 26.0–34.0)
MCHC: 32.5 g/dL (ref 30.0–36.0)
MCV: 89.2 fL (ref 78.0–100.0)
Platelets: 294 10*3/uL (ref 150–400)
RBC: 4.25 MIL/uL (ref 3.87–5.11)
RDW: 16.3 % — ABNORMAL HIGH (ref 11.5–15.5)
WBC: 11.7 10*3/uL — ABNORMAL HIGH (ref 4.0–10.5)

## 2014-11-21 MED ORDER — KETOROLAC TROMETHAMINE 60 MG/2ML IM SOLN
60.0000 mg | Freq: Once | INTRAMUSCULAR | Status: AC
  Administered 2014-11-21: 60 mg via INTRAMUSCULAR
  Filled 2014-11-21: qty 2

## 2014-11-21 MED ORDER — ACETAMINOPHEN 500 MG PO TABS
1000.0000 mg | ORAL_TABLET | Freq: Once | ORAL | Status: AC
  Administered 2014-11-21: 1000 mg via ORAL
  Filled 2014-11-21: qty 2

## 2014-11-21 MED ORDER — CLINDAMYCIN HCL 150 MG PO CAPS: 450.0000 mg | ORAL_CAPSULE | Freq: Three times a day (TID) | ORAL | Status: DC

## 2014-11-21 MED ORDER — CLINDAMYCIN HCL 300 MG PO CAPS
600.0000 mg | ORAL_CAPSULE | Freq: Once | ORAL | Status: AC
  Administered 2014-11-21: 600 mg via ORAL
  Filled 2014-11-21: qty 2

## 2014-11-21 MED ORDER — ONDANSETRON 8 MG PO TBDP
8.0000 mg | ORAL_TABLET | Freq: Once | ORAL | Status: AC
  Administered 2014-11-21: 8 mg via ORAL
  Filled 2014-11-21: qty 1

## 2014-11-21 MED ORDER — LIDOCAINE-EPINEPHRINE 2 %-1:100000 IJ SOLN
20.0000 mL | Freq: Once | INTRAMUSCULAR | Status: AC
  Administered 2014-11-21: 20 mL
  Filled 2014-11-21: qty 1

## 2014-11-21 MED ORDER — ONDANSETRON 8 MG PO TBDP: ORAL_TABLET | ORAL | Status: DC

## 2014-11-21 NOTE — Discharge Instructions (Signed)
1. Medications: Tramadol for pain, Clindamycin, usual home medications 2. Treatment: rest, drink plenty of fluids, use warm compresses, flush abscess with warm water several times per day 3. Follow Up: Please followup with your primary doctor in 2-3 days for discussion of your diagnoses and further evaluation after today's visit; if you do not have a primary care doctor use the resource guide provided to find one; Please return to the ER for fevers, chills, nausea, vomiting or other signs of infection    Abscess An abscess is an infected area that contains a collection of pus and debris.It can occur in almost any part of the body. An abscess is also known as a furuncle or boil. CAUSES  An abscess occurs when tissue gets infected. This can occur from blockage of oil or sweat glands, infection of hair follicles, or a minor injury to the skin. As the body tries to fight the infection, pus collects in the area and creates pressure under the skin. This pressure causes pain. People with weakened immune systems have difficulty fighting infections and get certain abscesses more often.  SYMPTOMS Usually an abscess develops on the skin and becomes a painful mass that is red, warm, and tender. If the abscess forms under the skin, you may feel a moveable soft area under the skin. Some abscesses break open (rupture) on their own, but most will continue to get worse without care. The infection can spread deeper into the body and eventually into the bloodstream, causing you to feel ill.  DIAGNOSIS  Your caregiver will take your medical history and perform a physical exam. A sample of fluid may also be taken from the abscess to determine what is causing your infection. TREATMENT  Your caregiver may prescribe antibiotic medicines to fight the infection. However, taking antibiotics alone usually does not cure an abscess. Your caregiver may need to make a small cut (incision) in the abscess to drain the pus. In some  cases, gauze is packed into the abscess to reduce pain and to continue draining the area. HOME CARE INSTRUCTIONS   Only take over-the-counter or prescription medicines for pain, discomfort, or fever as directed by your caregiver.  If you were prescribed antibiotics, take them as directed. Finish them even if you start to feel better.  If gauze is used, follow your caregiver's directions for changing the gauze.  To avoid spreading the infection:  Keep your draining abscess covered with a bandage.  Wash your hands well.  Do not share personal care items, towels, or whirlpools with others.  Avoid skin contact with others.  Keep your skin and clothes clean around the abscess.  Keep all follow-up appointments as directed by your caregiver. SEEK MEDICAL CARE IF:   You have increased pain, swelling, redness, fluid drainage, or bleeding.  You have muscle aches, chills, or a general ill feeling.  You have a fever. MAKE SURE YOU:   Understand these instructions.  Will watch your condition.  Will get help right away if you are not doing well or get worse. Document Released: 12/14/2004 Document Revised: 09/05/2011 Document Reviewed: 05/19/2011 [facility] Patient Information 2015 [place], [place]. This information is not intended to replace advice given to you by your health care provider. Make sure you discuss any questions you have with your health care provider.

## 2014-11-21 NOTE — ED Provider Notes (Signed)
CSN: [id number]     Arrival date & time 11/21/14  1148 History   First MD Initiated Contact with Patient 11/21/14 1202     Chief Complaint  Patient presents with  . Wound Infection     (Consider location/radiation/quality/duration/timing/severity/associated sxs/prior Treatment) The history is provided by the patient and medical records. No language interpreter was used.     Cassandra Cunningham is a 44 y.o. female  with a hx of depression, mood disorder, alcohol abuse, schizophrenia presents to the Emergency Department complaining of gradual, persistent, progressively worsening swelling and pain to the chin onset yesterday morning.  Pt reports no abx at any time for this. Associated symptoms include lower abd discomfort and nausea.  Pt has taken Tramadol and Aleve without relief.  Nothing makes it better and palpation makes it worse.  Pt denies fever, chills, headache, neck pain, difficulty breathing, difficulty swallowing, vomiting, diarrhea, weakness, dizziness.   Pt reports she followed-up with ENT as directed and has a follow-up on Wednesday.    Past Medical History  Diagnosis Date  . Kidney infection   . Hemorrhoids     bleeding  . Depression   . Anxiety   . Eating disorder     may be an ongoing issue  . Bipolar disorder   . Kidney stones   . Alcohol abuse   . Bradycardia     history of slow heart rate  . Schizophrenia     BH visit Dr. Volanda Napoleon- 04-28-14 " feeling things and hearing things"- tx. meds has given some improvement- next monthly visit April 2015.  Marland Kitchen Acute appendicitis 04/14/2014  . MDD (major depressive disorder) 12/25/2012   Past Surgical History  Procedure Laterality Date  . Tubal ligation    . Cesarean section    . Wisdon teeth    . Laparoscopic appendectomy N/A 04/14/2014    Procedure: APPENDECTOMY LAPAROSCOPIC;  Surgeon: Stark Klein, MD;  Location: [facility];  Service: General;  Laterality: N/A;  . Appendectomy      1'16  . Incision and drainage of wound Left  06/10/2014    Procedure: EXCISION OF LEFT RETROPERITONEAL MASS;  Surgeon: Stark Klein, MD;  Location: [facility];  Service: General;  Laterality: Left;   Family History  Problem Relation Age of Onset  . Mental illness Mother   . Heart disease Father   . Depression Father   . Alcohol abuse Father   . Diabetes Sister   . Bipolar disorder Sister   . Diabetes Brother   . Bipolar disorder Brother   . Heart disease Paternal Grandfather   . Eating disorder Father   . Suicidality Paternal Uncle    Social History  Substance Use Topics  . Smoking status: Former Smoker    Quit date: 12/19/2007  . Smokeless tobacco: Never Used  . Alcohol Use: Yes     Comment: Quit. Hx of alcohol dependence-now reports that she only drinks etoh occasionally (a few times a year)   OB History    No data available     Review of Systems  Constitutional: Negative for fever, diaphoresis, appetite change, fatigue and unexpected weight change.  HENT: Negative for mouth sores.   Eyes: Negative for visual disturbance.  Respiratory: Negative for cough, chest tightness, shortness of breath and wheezing.   Cardiovascular: Negative for chest pain.  Gastrointestinal: Negative for nausea, vomiting, abdominal pain, diarrhea and constipation.  Endocrine: Negative for polydipsia, polyphagia and polyuria.  Genitourinary: Negative for dysuria, urgency, frequency and hematuria.  Musculoskeletal:  Negative for back pain and neck stiffness.  Skin: Positive for color change. Negative for rash.       Abscess  Allergic/Immunologic: Negative for immunocompromised state.  Neurological: Negative for syncope, light-headedness and headaches.  Hematological: Does not bruise/bleed easily.  Psychiatric/Behavioral: Negative for sleep disturbance. The patient is not nervous/anxious.       Allergies  Review of patient's allergies indicates no known allergies.  Home Medications   Prior to Admission medications   Medication Sig Start  Date End Date Taking? Authorizing Provider  ferrous sulfate 325 (65 FE) MG tablet Take 325 mg by mouth daily. 10/01/14  Yes Historical Provider, MD  KLOR-CON M20 20 MEQ tablet Take 20 mEq by mouth daily.  11/17/14  Yes Historical Provider, MD  Multiple Vitamin (MULTIVITAMIN WITH MINERALS) TABS tablet Take 1 tablet by mouth daily.   Yes Historical Provider, MD  traMADol (ULTRAM) 50 MG tablet Take 1 tablet (50 mg total) by mouth every 6 (six) hours as needed. 11/08/14  Yes Braylen Staller, PA-C  clindamycin (CLEOCIN) 150 MG capsule Take 3 capsules (450 mg total) by mouth 3 (three) times daily. 11/21/14   Robbin Escher, PA-C  HYDROcodone-acetaminophen (NORCO/VICODIN) 5-325 MG per tablet Take 1 tablet by mouth every 4 (four) hours as needed for moderate pain or severe pain. Patient not taking: Reported on 11/08/2014 11/05/14   Clayton Bibles, PA-C  LINZESS 145 MCG CAPS capsule Take 145 mcg by mouth daily. 08/13/14   Historical Provider, MD  ondansetron (ZOFRAN ODT) 8 MG disintegrating tablet 8mg  ODT q4 hours prn nausea 11/21/14   Aldin Drees, PA-C   BP 103/63 mmHg  Pulse 55  Temp(Src) 98.6 F (37 C) (Oral)  Resp 16  SpO2 100%  LMP 10/18/2014 Physical Exam  Constitutional: She is oriented to person, place, and time. She appears well-developed and well-nourished. No distress.  HENT:  Head: Normocephalic and atraumatic.  Large abscess to the anterior chin with palpable fluctuance and surrounding erythema, induration and edema  Eyes: Conjunctivae are normal. No scleral icterus.  Neck: Normal range of motion.  Cardiovascular: Normal rate, regular rhythm, normal heart sounds and intact distal pulses.   No murmur heard. Pulmonary/Chest: Effort normal and breath sounds normal.  Abdominal: Soft. She exhibits no distension. There is no tenderness.  Lymphadenopathy:    She has no cervical adenopathy.  Neurological: She is alert and oriented to person, place, and time.  Skin: Skin is warm and  dry. She is not diaphoretic. There is erythema.  Psychiatric: She has a normal mood and affect.  Nursing note and vitals reviewed.   ED Course  INCISION AND DRAINAGE Date/Time: 11/21/2014 1:58 PM Performed by: Abigail Butts Authorized by: Abigail Butts Consent: Verbal consent obtained. Risks and benefits: risks, benefits and alternatives were discussed Consent given by: patient Patient understanding: patient states understanding of the procedure being performed Patient consent: the patient's understanding of the procedure matches consent given Procedure consent: procedure consent matches procedure scheduled Relevant documents: relevant documents present and verified Test results: test results available and properly labeled Site marked: the operative site was marked Imaging studies: imaging studies available (bedside US) Required items: required blood products, implants, devices, and special equipment available Patient identity confirmed: verbally with patient and arm band Time out: Immediately prior to procedure a "time out" was called to verify the correct patient, procedure, equipment, support staff and site/side marked as required. Type: abscess Body area: head (chin) Anesthesia: local infiltration Local anesthetic: lidocaine 2% with epinephrine Anesthetic total: 5 ml  Patient sedated: no Scalpel size: 11 Incision type: single straight Complexity: complex Drainage: purulent Drainage amount: copious Wound treatment: wound left open Packing material: none Patient tolerance: Patient tolerated the procedure well with no immediate complications   (including critical care time) Labs Review Labs Reviewed  CBC - Abnormal; Notable for the following:    WBC 11.7 (*)    RDW 16.3 (*)    All other components within normal limits  BASIC METABOLIC PANEL - Abnormal; Notable for the following:    Potassium 3.4 (*)    All other components within normal limits  URINALYSIS,  ROUTINE W REFLEX MICROSCOPIC (NOT AT [facility])    Imaging Review No results found. I have personally reviewed and evaluated these images and lab results as part of my medical decision-making.   EKG Interpretation None        EMERGENCY DEPARTMENT US SOFT TISSUE INTERPRETATION "Study: Limited Ultrasound of the noted body part in comments below"  INDICATIONS: Pain and Soft tissue infection Multiple views of the body part are obtained with a multi-frequency linear probe  PERFORMED BY:  Myself  IMAGES ARCHIVED?: Yes  SIDE:Midline  BODY PART:Other soft tisse (comment in note)  - chin  FINDINGS: Abcess present and Cellulitis present  LIMITATIONS:  Body Habitus  INTERPRETATION:  Abcess present and Cellulitis present  COMMENT:  Abscess of the chin     MDM   Final diagnoses:  Abscess of chin   [name] presents with chin abscess after oral laceration.  Abscess appears to be more external.  No sinus tracks into the mouth, TTP of the teeth, induration to the floor of the mouth, airway compromise.  Clinical exam is inconsistent with Ludwig's angina and I do not believe this is present.    Patient with skin abscess amenable to incision and drainage.  Abscess was not large enough to warrant packing or drain, wound recheck in 2 days. Encouraged home warm soaks and flushing.  Signs of cellulitis in surrounding skin.  Will d/c to home with clindamycin.  Pt has ENT/plastic surgery follow-up on Wednesday.  Discussed reasons to return to the ED.    BP 103/63 mmHg  Pulse 55  Temp(Src) 98.6 F (37 C) (Oral)  Resp 16  SpO2 100%  LMP 10/18/2014   The patient was discussed with and seen by Dr. Colin Rhein who agrees with the treatment plan. Jarrett Soho Janise Gora, PA-C 11/21/14 1507  Debby Freiberg, MD 11/22/14 [phone]
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
## 2015-01-07 ENCOUNTER — Ambulatory Visit (HOSPITAL_COMMUNITY): Payer: Self-pay | Admitting: Psychiatry

## 2015-01-26 ENCOUNTER — Ambulatory Visit (HOSPITAL_COMMUNITY): Payer: Self-pay | Admitting: Psychiatry

## 2015-04-20 ENCOUNTER — Ambulatory Visit (HOSPITAL_COMMUNITY): Payer: Self-pay | Admitting: Psychiatry

## 2015-06-01 ENCOUNTER — Ambulatory Visit (HOSPITAL_COMMUNITY): Payer: Self-pay | Admitting: Psychiatry

## 2016-01-20 ENCOUNTER — Ambulatory Visit (INDEPENDENT_AMBULATORY_CARE_PROVIDER_SITE_OTHER): Payer: Medicare Other | Admitting: Endocrinology

## 2016-01-20 ENCOUNTER — Encounter: Payer: Self-pay | Admitting: Endocrinology

## 2016-01-20 DIAGNOSIS — E042 Nontoxic multinodular goiter: Secondary | ICD-10-CM

## 2016-01-20 NOTE — Patient Instructions (Signed)
No further thyroid testing or treatment is needed now. There is no need to take iodine. Please return in 1 year.

## 2016-01-20 NOTE — Progress Notes (Signed)
Subjective:    Patient ID: Cassandra Cunningham, female    DOB: 10/25/1970, 45 y.o.   MRN: [id number]  HPI Pt states many years of moderate coldness of all 4 extremities, and assoc fatigue. In eval of this, she was noted to have nodules in the thyroid.  she is unaware of ever having had thyroid problems in the past.  she has no h/o XRT or surgery to the neck.   Past Medical History:  Diagnosis Date  . Acute appendicitis 04/14/2014  . Alcohol abuse   . Anxiety   . Bipolar disorder ([place])   . Bradycardia    history of slow heart rate  . Depression   . Eating disorder    may be an ongoing issue  . Hemorrhoids    bleeding  . Kidney infection   . Kidney stones   . MDD (major depressive disorder) 12/25/2012  . Schizophrenia [facility])    [place] visit Dr. Volanda Napoleon- 04-28-14 " feeling things and hearing things"- tx. meds has given some improvement- next monthly visit April 2015.    Past Surgical History:  Procedure Laterality Date  . APPENDECTOMY     1'16  . CESAREAN SECTION    . INCISION AND DRAINAGE OF WOUND Left 06/10/2014   Procedure: EXCISION OF LEFT RETROPERITONEAL MASS;  Surgeon: Stark Klein, MD;  Location: [facility];  Service: General;  Laterality: Left;  . LAPAROSCOPIC APPENDECTOMY N/A 04/14/2014   Procedure: APPENDECTOMY LAPAROSCOPIC;  Surgeon: Stark Klein, MD;  Location: [facility];  Service: General;  Laterality: N/A;  . TUBAL LIGATION    . wisdon teeth      Social History   Social History  . Marital status: Divorced    Spouse name: N/A  . Number of children: N/A  . Years of education: N/A   Occupational History  . Not on file.   Social History Main Topics  . Smoking status: Former Smoker    Quit date: 12/19/2007  . Smokeless tobacco: Never Used  . Alcohol use Yes     Comment: Quit. Hx of alcohol dependence-now reports that she only drinks etoh occasionally (a few times a year)  . Drug use: No  . Sexual activity: Yes    Birth control/ protection: Surgical     Comment: no  protection used   Other Topics Concern  . Not on file   Social History Narrative  . No narrative on file    Current Outpatient Prescriptions on File Prior to Visit  Medication Sig Dispense Refill  . KLOR-CON M20 20 MEQ tablet Take 20 mEq by mouth daily.     . [DISCONTINUED] FLUoxetine (PROZAC) 20 MG capsule Take 1 capsule (20 mg total) by mouth daily. (Patient not taking: Reported on 11/08/2014) 30 capsule 2   No current facility-administered medications on file prior to visit.     No Known Allergies  Family History  Problem Relation Age of Onset  . Mental illness Mother   . Heart disease Father   . Depression Father   . Alcohol abuse Father   . Eating disorder Father   . Diabetes Sister   . Bipolar disorder Sister   . Thyroid disease Sister   . Diabetes Brother   . Bipolar disorder Brother   . Heart disease Paternal Grandfather   . Suicidality Paternal Uncle     BP 93/63   Pulse 67   Ht 5' 6.75" (1.695 m)   Wt 106 lb (48.1 kg)   SpO2 97%  BMI 16.73 kg/m    Review of Systems Denies recent weight change, hoarseness, neck pain, visual loss, chest pain, sob, cough, dysphagia, dry skin, headache, numbness, and rhinorrhea.  She has excessive diaphoresis, easy bruising, headache, depression, and constipation.  She has reg menses     Objective:   Physical Exam VS: see vs page GEN: no distress.  Lean body habitus HEAD: head: no deformity eyes: no periorbital swelling, no proptosis external nose and ears are normal mouth: no lesion seen NECK: thyroid is not enlarged overall.  I can palpate a 1 cm left thyroid nodule, which is freely mobile CHEST WALL: no deformity LUNGS: clear to auscultation CV: reg rate and rhythm, no murmur ABD: abdomen is soft, nontender.  no hepatosplenomegaly.  not distended.  no hernia MUSCULOSKELETAL: muscle bulk and strength are grossly normal.  no obvious joint swelling.  gait is normal and steady EXTEMITIES: no deformity.  no  edema PULSES: no carotid bruit NEURO:  cn 2-12 grossly intact.   readily moves all 4's.  sensation is intact to touch on all 4's SKIN:  Normal texture and temperature.  No rash or suspicious lesion is visible.   NODES:  None palpable at the neck PSYCH: alert, well-oriented.  Does not appear anxious nor depressed.   CT of C-spine: no mention is made of the thyroid.  Thyroid US: multinodular goiter  I have reviewed outside records, and summarized: Pt was noted to have multinodular goiter, and referred here.  Also, she was ref to mental health, due to schizoaffective disorder  TSH=0.8    Assessment & Plan:  Small multinodular goiter, new.  Euthyroid.   Cold intolerance, not thyroid-related.   Patient is advised the following: Patient Instructions  No further thyroid testing or treatment is needed now. There is no need to take iodine. Please return in 1 year.

## 2016-01-21 DIAGNOSIS — E042 Nontoxic multinodular goiter: Secondary | ICD-10-CM | POA: Insufficient documentation

## 2016-01-23 IMAGING — CR DG FOOT 2V*L*
2 series · 2 of 2 positions shown · non-contrast
Comparison: None.

CLINICAL DATA: Pain across the metatarsals.  Initial encounter.

EXAM:
LEFT FOOT - 2 VIEW

[foot ap]
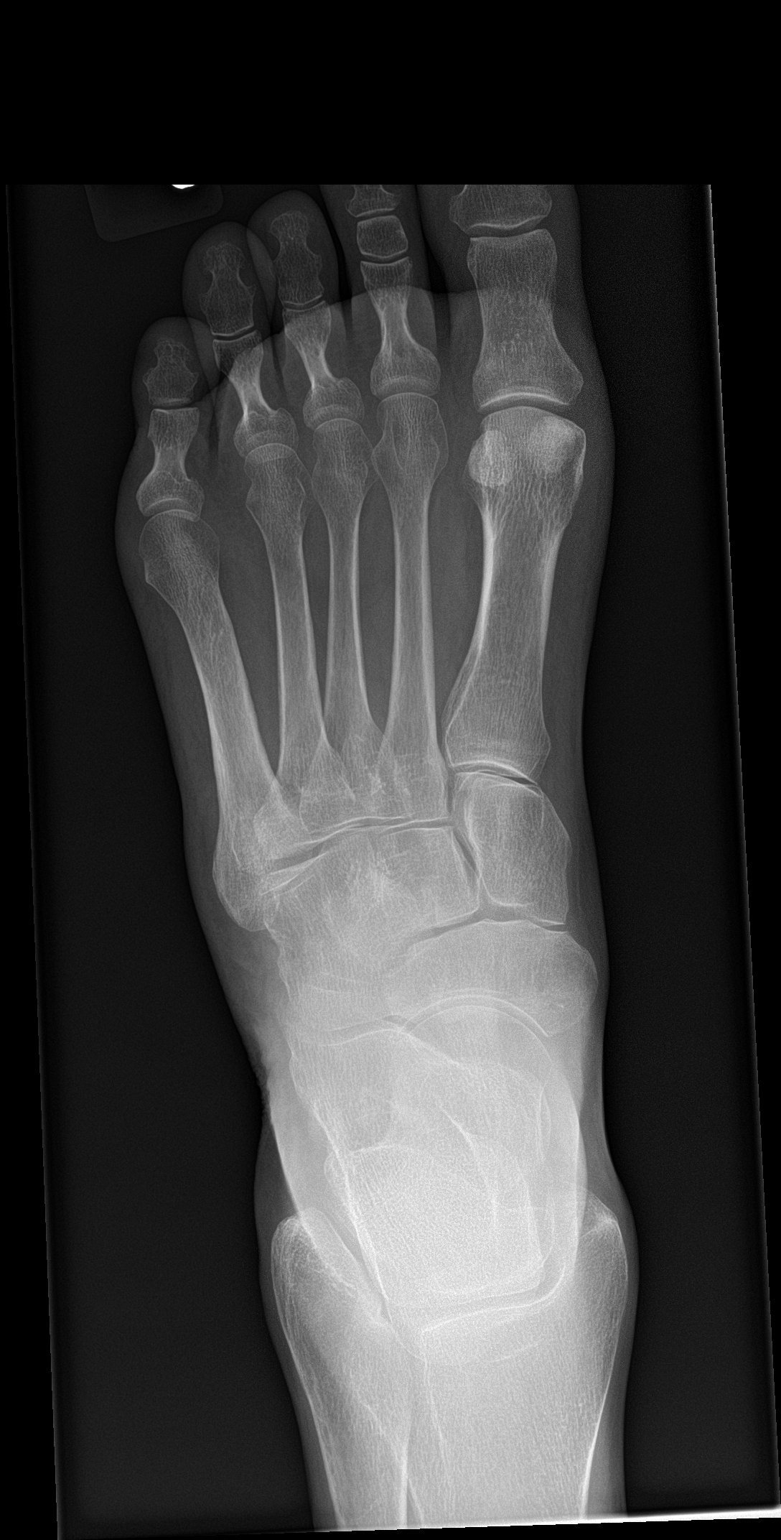

[foot lat]
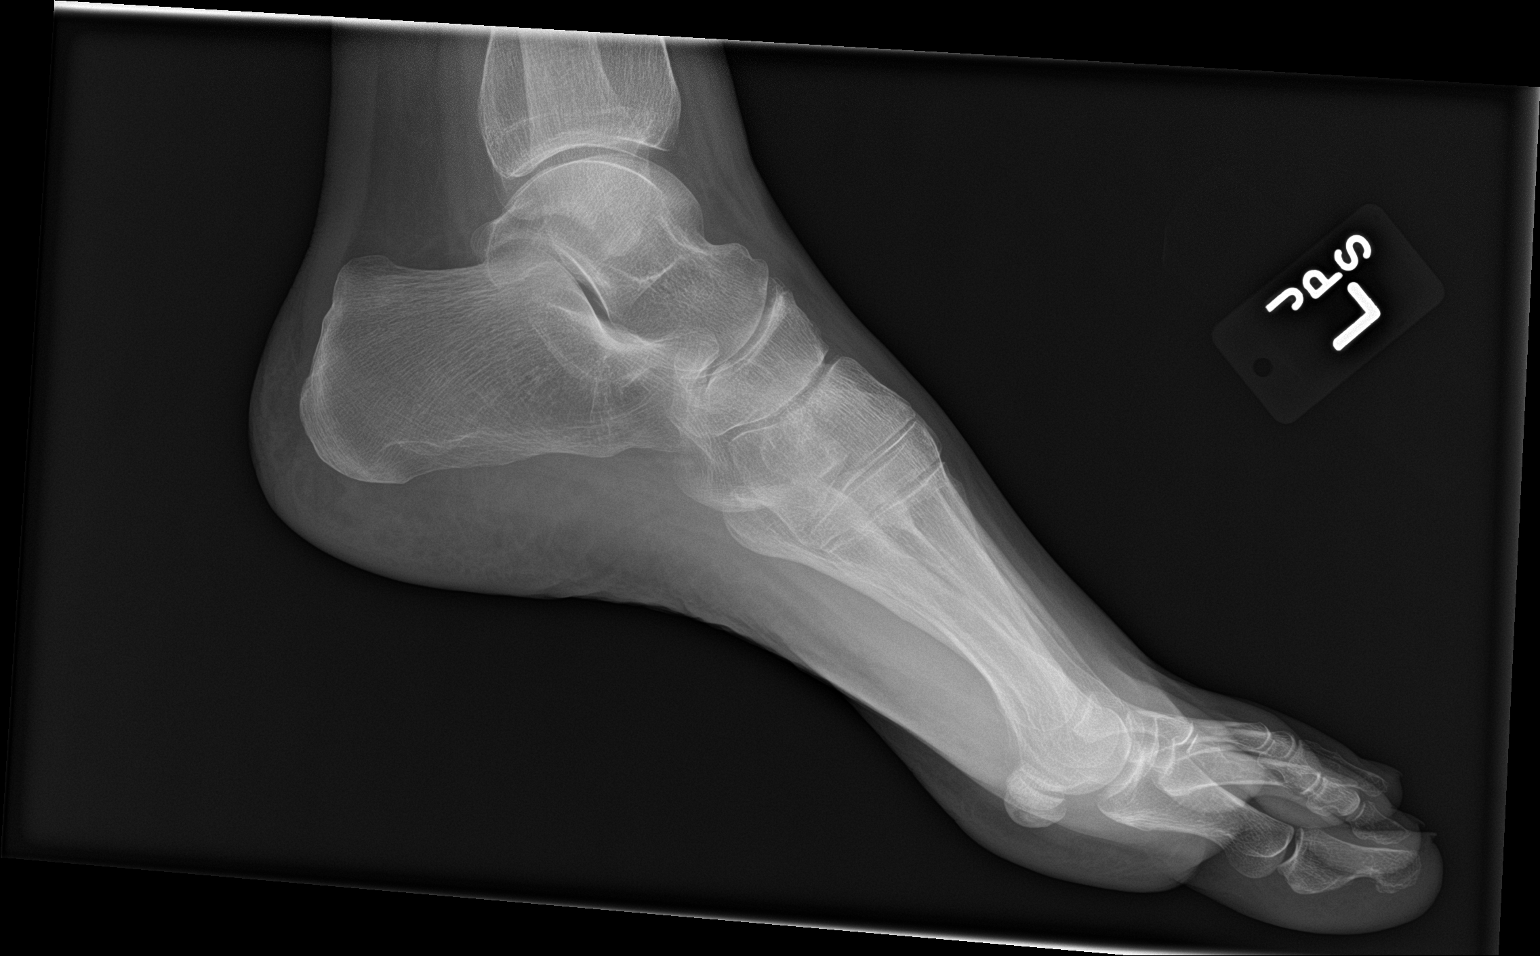

[2 of 2 positions shown; findings below may reference images not displayed]

FINDINGS: No acute bone or soft tissue abnormalities are present. No
radiopaque foreign body is present.
IMPRESSION: Negative left foot radiographs.
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
## 2016-04-04 ENCOUNTER — Encounter (HOSPITAL_COMMUNITY): Payer: Self-pay

## 2016-05-25 ENCOUNTER — Ambulatory Visit (INDEPENDENT_AMBULATORY_CARE_PROVIDER_SITE_OTHER): Payer: Medicare Other | Admitting: Psychiatry

## 2016-05-25 ENCOUNTER — Encounter (HOSPITAL_COMMUNITY): Payer: Self-pay | Admitting: Psychiatry

## 2016-05-25 VITALS — BP 100/68 | HR 60 | Ht 67.0 in | Wt 112.0 lb

## 2016-05-25 DIAGNOSIS — F411 Generalized anxiety disorder: Secondary | ICD-10-CM | POA: Diagnosis not present

## 2016-05-25 DIAGNOSIS — F250 Schizoaffective disorder, bipolar type: Secondary | ICD-10-CM | POA: Diagnosis not present

## 2016-05-25 DIAGNOSIS — Z818 Family history of other mental and behavioral disorders: Secondary | ICD-10-CM | POA: Diagnosis not present

## 2016-05-25 DIAGNOSIS — F502 Bulimia nervosa: Secondary | ICD-10-CM | POA: Diagnosis not present

## 2016-05-25 DIAGNOSIS — Z811 Family history of alcohol abuse and dependence: Secondary | ICD-10-CM | POA: Diagnosis not present

## 2016-05-25 DIAGNOSIS — Z79899 Other long term (current) drug therapy: Secondary | ICD-10-CM | POA: Diagnosis not present

## 2016-05-25 DIAGNOSIS — Z87891 Personal history of nicotine dependence: Secondary | ICD-10-CM | POA: Diagnosis not present

## 2016-05-25 MED ORDER — ZIPRASIDONE HCL 20 MG PO CAPS: 20.0000 mg | ORAL_CAPSULE | Freq: Two times a day (BID) | ORAL | 1 refills | Status: DC

## 2016-05-25 MED ORDER — PAROXETINE HCL 20 MG PO TABS: 20.0000 mg | ORAL_TABLET | Freq: Every day | ORAL | 1 refills | Status: DC

## 2016-05-25 NOTE — Patient Instructions (Signed)
1. Labs 2. EKG call [phone]
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
## 2016-05-30 ENCOUNTER — Other Ambulatory Visit (HOSPITAL_COMMUNITY): Payer: Self-pay

## 2016-06-02 ENCOUNTER — Emergency Department (HOSPITAL_COMMUNITY)
Admission: EM | Admit: 2016-06-02 | Discharge: 2016-06-02 | Disposition: A | Discharge status: Home / Self Care | Payer: Medicare Other | Attending: Physician Assistant | Admitting: Physician Assistant

## 2016-06-02 ENCOUNTER — Encounter (HOSPITAL_COMMUNITY): Payer: Self-pay | Admitting: *Deleted

## 2016-06-02 ENCOUNTER — Emergency Department (HOSPITAL_COMMUNITY): Payer: Medicare Other

## 2016-06-02 ENCOUNTER — Ambulatory Visit (HOSPITAL_COMMUNITY)
Admission: RE | Admit: 2016-06-02 | Discharge: 2016-06-02 | Disposition: A | Discharge status: Home / Self Care | Payer: Medicare Other | Source: Ambulatory Visit | Attending: Psychiatry | Admitting: Psychiatry

## 2016-06-02 DIAGNOSIS — Z79899 Other long term (current) drug therapy: Secondary | ICD-10-CM

## 2016-06-02 DIAGNOSIS — R109 Unspecified abdominal pain: Secondary | ICD-10-CM | POA: Insufficient documentation

## 2016-06-02 DIAGNOSIS — M545 Low back pain, unspecified: Secondary | ICD-10-CM

## 2016-06-02 DIAGNOSIS — I4581 Long QT syndrome: Secondary | ICD-10-CM | POA: Diagnosis not present

## 2016-06-02 DIAGNOSIS — Z87891 Personal history of nicotine dependence: Secondary | ICD-10-CM | POA: Diagnosis not present

## 2016-06-02 LAB — URINALYSIS, ROUTINE W REFLEX MICROSCOPIC
Bilirubin Urine: NEGATIVE
Glucose, UA: NEGATIVE mg/dL
Hgb urine dipstick: NEGATIVE
Ketones, ur: NEGATIVE mg/dL
Leukocytes, UA: NEGATIVE
Nitrite: NEGATIVE
Protein, ur: NEGATIVE mg/dL
Specific Gravity, Urine: 1.002 — ABNORMAL LOW (ref 1.005–1.030)
pH: 7 (ref 5.0–8.0)

## 2016-06-02 LAB — POC URINE PREG, ED: Preg Test, Ur: NEGATIVE

## 2016-06-02 LAB — CBC
HCT: 37.7 % (ref 35.0–45.0)
Hemoglobin: 12.4 g/dL (ref 11.7–15.5)
MCH: 29.3 pg (ref 27.0–33.0)
MCHC: 32.9 g/dL (ref 32.0–36.0)
MCV: 89.1 fL (ref 80.0–100.0)
MPV: 9.9 fL (ref 7.5–12.5)
Platelets: 240 10*3/uL (ref 140–400)
RBC: 4.23 MIL/uL (ref 3.80–5.10)
RDW: 14.2 % (ref 11.0–15.0)
WBC: 5.7 10*3/uL (ref 3.8–10.8)

## 2016-06-02 MED ORDER — METHOCARBAMOL 500 MG PO TABS: 500.0000 mg | ORAL_TABLET | Freq: Two times a day (BID) | ORAL | 0 refills | Status: DC

## 2016-06-02 MED ORDER — NAPROXEN 375 MG PO TABS: 375.0000 mg | ORAL_TABLET | Freq: Two times a day (BID) | ORAL | 0 refills | Status: DC

## 2016-06-02 NOTE — ED Notes (Signed)
Returned from CT.

## 2016-06-02 NOTE — ED Notes (Signed)
Patient transported to CT 

## 2016-06-02 NOTE — ED Provider Notes (Signed)
[facility] DEPT Provider Note   CSN: [id number] Arrival date & time: 06/02/16  1103  By signing my name below, I, [name], attest that this documentation has been prepared under the direction and in the presence of Melina Schools, PA-C. Electronically Signed: [name], ED Scribe. 06/02/2016. 1:16 PM.    History   Chief Complaint Chief Complaint  Patient presents with  . Back Pain    The history is provided by the patient. No language interpreter was used.   HPI Comments:  Cassandra Cunningham is a 46 y.o. female with PMHx of kidney stones and kidney infection and benign retroperitoneal mass removed in 2016 who presents to the Emergency Department complaining of constant left lower back pain that began 3 days ago. Pt states that while she was standing up, the pain began suddenly in her lower left back. Denies any other trauma. Denies any history of back problems. Moving makes the pain worse. No alleviating factors noted. Pt denies urinary symptoms, bowel/bladder incontinence, urinary retention, lower extremity paresthesias, fever, vomiting.   Past Medical History:  Diagnosis Date  . Acute appendicitis 04/14/2014  . Alcohol abuse   . Anxiety   . Bipolar disorder ([place])   . Bradycardia    history of slow heart rate  . Depression   . Eating disorder    may be an ongoing issue  . Hemorrhoids    bleeding  . Kidney infection   . Kidney stones   . MDD (major depressive disorder) 12/25/2012  . Schizophrenia [facility])    [facility] visit Dr. Volanda Napoleon- 04-28-14 " feeling things and hearing things"- tx. meds has given some improvement- next monthly visit April 2015.  Marland Kitchen Thyroid disease    nodules    Patient Active Problem List   Diagnosis Date Noted  . Multinodular goiter 01/21/2016  . 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 Schizoaffective disorder, bipolar type ([place]) 05/05/2014  . GAD (generalized anxiety disorder) 05/05/2014  . Bulimia nervosa 05/05/2014  .  Alcohol dependence in remission ([place]) 05/05/2014  . Suicidal ideation 12/25/2012  . Lumbar strain 12/25/2012  . Levator spasm 11/07/2012  . Internal and external bleeding hemorrhoids 05/07/2012  . Anorexia nervosa 09/13/2011    Past Surgical History:  Procedure Laterality Date  . APPENDECTOMY     1'16  . CESAREAN SECTION    . INCISION AND DRAINAGE OF WOUND Left 06/10/2014   Procedure: EXCISION OF LEFT RETROPERITONEAL MASS;  Surgeon: Stark Klein, MD;  Location: [facility] ORS;  Service: General;  Laterality: Left;  . LAPAROSCOPIC APPENDECTOMY N/A 04/14/2014   Procedure: APPENDECTOMY LAPAROSCOPIC;  Surgeon: Stark Klein, MD;  Location: [facility] ORS;  Service: General;  Laterality: N/A;  . TUBAL LIGATION    . TUMOR REMOVAL  04/2014   abdomen  . wisdon teeth      OB History    No data available       Home Medications    Prior to Admission medications   Medication Sig Start Date End Date Taking? Authorizing Provider  PARoxetine (PAXIL) 20 MG tablet Take 1 tablet (20 mg total) by mouth daily. 05/25/16 05/25/17  Charlcie Cradle, MD  ziprasidone (GEODON) 20 MG capsule Take 1 capsule (20 mg total) by mouth 2 (two) times daily with a meal. 05/25/16   Charlcie Cradle, MD    Family History Family History  Problem Relation Age of Onset  . Mental illness Mother   . Heart disease Father   . Depression Father   . Alcohol  abuse Father   . Eating disorder Father   . Diabetes Sister   . Bipolar disorder Sister   . Thyroid disease Sister   . Diabetes Brother   . Bipolar disorder Brother   . Heart disease Paternal Grandfather   . Suicidality Paternal Uncle   . Schizophrenia Sister     Social History Social History  Substance Use Topics  . Smoking status: Former Smoker    Packs/day: 0.50    Types: Cigarettes    Quit date: 12/19/2007  . Smokeless tobacco: Never Used  . Alcohol use Yes     Comment: Hx of alcohol dependence-now reports that she only drinks etoh occasionally (a few times a year)      Allergies   Patient has no known allergies.   Review of Systems Review of Systems  Constitutional: Negative for fever.  Gastrointestinal: Negative for vomiting.  Genitourinary: Negative for dysuria and hematuria.  Musculoskeletal: Positive for back pain.  Neurological: Negative for numbness.     Physical Exam Updated Vital Signs BP 101/79 (BP Location: Right Arm)   Pulse (!) 58   Temp 98 F (36.7 C) (Oral)   Resp 15   Ht 5\' 7"  (1.702 m)   Wt 112 lb 4 oz (50.9 kg)   LMP 05/26/2016 (Exact Date)   SpO2 100%   BMI 17.58 kg/m   Physical Exam  Constitutional: She is oriented to person, place, and time. She appears well-developed and well-nourished. No distress.  Nontoxic-appearing.  HENT:  Head: Normocephalic and atraumatic.  Eyes: Conjunctivae and EOM are normal. Pupils are equal, round, and reactive to light.  Neck: Normal range of motion. Neck supple.  No midline tenderness. Full range of motion.  Cardiovascular:  Bradycardia history of same.  Pulmonary/Chest: Effort normal.  Abdominal: She exhibits no distension. There is no tenderness. There is no rebound and no guarding.  No CVA tenderness.  Musculoskeletal: Normal range of motion. She exhibits no edema.  No midline L-spine tenderness. No midline T-spine tenderness. She does have mild tenderness to the left paraspinal musculature of the lumbar region. No ecchymosis, edema, erythema noted. No CVA tenderness. Pain radiates to her left buttocks. Negative straight leg raise test on the left. No lower extremity edema or calf tenderness.  Lymphadenopathy:    She has no cervical adenopathy.  Neurological: She is alert and oriented to person, place, and time.  Strength 5 out of 5 in lower shoulders. Sensation intact to sharp/dull. Cap refill normal.  Skin: Skin is warm and dry.  Psychiatric: She has a normal mood and affect.  Nursing note and vitals reviewed.    ED Treatments / Results  DIAGNOSTIC  STUDIES:  Oxygen Saturation is 100% on RA, normal by my interpretation.    COORDINATION OF CARE:  1:14 PM Will order x-ray. Discussed treatment plan with pt at bedside and pt agreed to plan.   Labs (all labs ordered are listed, but only abnormal results are displayed) Labs Reviewed  URINALYSIS, ROUTINE W REFLEX MICROSCOPIC - Abnormal; Notable for the following:       Result Value   Color, Urine STRAW (*)    Specific Gravity, Urine 1.002 (*)    All other components within normal limits  POC URINE PREG, ED    EKG  EKG Interpretation None       Radiology Ct Abdomen Pelvis Wo Contrast  Result Date: 06/02/2016 CLINICAL DATA:  Left flank pain for 4 days. History of retroperitoneal mass. EXAM: CT ABDOMEN AND PELVIS WITHOUT  CONTRAST TECHNIQUE: Multidetector CT imaging of the abdomen and pelvis was performed following the standard protocol without IV contrast. COMPARISON:  08/30/2014 FINDINGS: Lower chest: Mild atelectasis in the left posterior costophrenic sulcus. Hepatobiliary: No focal liver abnormality.No evidence of biliary obstruction or stone. Pancreas: Unremarkable. Spleen: Unremarkable. Adrenals/Urinary Tract: Negative adrenals. Two punctate calculi in the right kidney. Bilateral renal cortical lobulation consistent with scarring, most notable at the poles. No hydronephrosis. Unremarkable bladder. Stomach/Bowel: No obstruction. Formed stool throughout the colon. Appendectomy changes. Vascular/Lymphatic: No acute vascular abnormality. No mass or adenopathy. Reproductive:Globular appearance of the uterus without evidence of underlying mass. 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 No evidence of recurrence. Other: No ascites or pneumoperitoneum. Musculoskeletal: No acute abnormalities. IMPRESSION: 1. No acute finding. 2. Formed stool throughout the colon. 3. Two tiny right renal calculi.  Bilateral renal cortical scarring. 4. History of resected lymphangioma.  No  evidence of recurrence. Electronically Signed   By: Monte Fantasia M.D.   On: 06/02/2016 14:14    Procedures Procedures (including critical care time)  Medications Ordered in ED Medications - No data to display   Initial Impression / Assessment and Plan / ED Course  I have reviewed the triage vital signs and the nursing notes.  Pertinent labs & imaging results that were available during my care of the patient were reviewed by me and considered in my medical decision making (see chart for details).     46 year old Caucasian female with a past medical history significant for pyelonephritis, UTI, retroperitoneal benign mass removed presents to the ED today with left lower back pain. Onset 4 days ago while standing. Denies any other injuries. Able to ambulate with normal gait. No neurological deficits and normal neuro exam.  Patient can walk but states is painful.  No loss of bowel or bladder control.  No concern for cauda equina.  No fever, night sweats, weight loss, h/o cancer, IVDU. Urine shows no signs of infection. No hemoglobin concerning for kidney stone. No CVA tenderness. Denies any nausea, vomiting, abdominal pain, vaginal symptoms. Patient does have a history of a benign retroperitoneal mass that was removed last year. We'll obtain CAT scan to evaluate for any injury or abdominal mechanism causing injury concerning for kidney stones, peylo or mass. CT scan was benign. No signs of recurrent mass, kidney sounds, Prilosec. Lumbar spine without any acute abnormalities. Patient is ambulatory. We will prescribe muscle relaxers and naproxen. Likely musculoskeletal pain.  RICE protocol and pain medicine indicated and discussed with patient. Have encouraged her to follow-up with her primary care doctor in 3-4 days if symptoms are not improving. Have given her strict return cautions. Patient was understanding the plan of care. All questions were answered prior to discharge. She is hemodynamically  stable in no acute distress with normal gait on discharge.    Final Clinical Impressions(s) / ED Diagnoses   Final diagnoses:  Acute left-sided low back pain without sciatica    New Prescriptions Discharge Medication List as of 06/02/2016  2:28 PM    START taking these medications   Details  methocarbamol (ROBAXIN) 500 MG tablet Take 1 tablet (500 mg total) by mouth 2 (two) times daily., Starting Fri 06/02/2016, Print    naproxen (NAPROSYN) 375 MG tablet Take 1 tablet (375 mg total) by mouth 2 (two) times daily., Starting Fri 06/02/2016, Print      I personally performed the services described in this documentation, which was scribed in my presence. The recorded information has been  reviewed and is accurate.    Doristine Devoid, PA-C 06/02/16 1449    [place], MD 06/02/16 2104

## 2016-06-02 NOTE — Discharge Instructions (Signed)
Imaging was normal. Your urine shows no signs of infection. No kidney stones present. This is likely musculoskeletal. Please keep warm compresses to her lower back. Have given you naproxen to take for pain. Do not take any extra Motrin/ibuprofen/Advil/Aleve with this medication. May take Tylenol. I give the Robaxin which is a muscle relaxer. This medication makes her drowsy. Please follow-up with her primary care doctor in 2-3 days if her symptoms do not improve. Return to the ED if he develops any worsening symptoms.  SEEK IMMEDIATE MEDICAL ATTENTION IF: New numbness, tingling, weakness, or problem with the use of your arms or legs.  Severe back pain not relieved with medications.  Change in bowel or bladder control.  Urinary retention.  Numbness in your groin.  Increasing pain in any areas of the body (such as chest or abdominal pain).  Shortness of breath, dizziness or fainting.  Nausea (feeling sick to your stomach), vomiting, fever, or sweats.

## 2016-06-02 NOTE — ED Notes (Signed)
Pt requesting pain meds.  PA notified. 

## 2016-06-02 NOTE — ED Triage Notes (Signed)
Pt reports acute onset of L lower back pain onset x 4 days while standing, denies injury, ambulatory, MAE, no bowel or bladder incontinence, denies urinary symptoms, A&O x4

## 2016-06-02 NOTE — ED Notes (Signed)
PA in with pt 

## 2016-06-02 NOTE — ED Notes (Signed)
Patient transported to X-ray 

## 2016-06-03 LAB — LIPID PANEL
Cholesterol: 130 mg/dL (ref <200)
HDL: 67 mg/dL (ref >50)
LDL Cholesterol: 50 mg/dL (ref <100)
Total CHOL/HDL Ratio: 1.9 Ratio (ref <5.0)
Triglycerides: 66 mg/dL (ref <150)
VLDL: 13 mg/dL (ref <30)

## 2016-06-03 LAB — COMPREHENSIVE METABOLIC PANEL
ALT: 13 U/L (ref 6–29)
AST: 16 U/L (ref 10–30)
Albumin: 3.8 g/dL (ref 3.6–5.1)
Alkaline Phosphatase: 50 U/L (ref 33–115)
BUN: 7 mg/dL (ref 7–25)
CO2: 25 mmol/L (ref 20–31)
Calcium: 8.8 mg/dL (ref 8.6–10.2)
Chloride: 104 mmol/L (ref 98–110)
Creat: 0.73 mg/dL (ref 0.50–1.10)
Glucose, Bld: 84 mg/dL (ref 65–99)
Potassium: 3.4 mmol/L — ABNORMAL LOW (ref 3.5–5.3)
Sodium: 137 mmol/L (ref 135–146)
Total Bilirubin: 0.4 mg/dL (ref 0.2–1.2)
Total Protein: 6.5 g/dL (ref 6.1–8.1)

## 2016-06-03 LAB — HEMOGLOBIN A1C
Hgb A1c MFr Bld: 4.8 % (ref <5.7)
Mean Plasma Glucose: 91 mg/dL

## 2016-06-03 LAB — PROLACTIN: Prolactin: 8.8 ng/mL

## 2016-06-03 LAB — TSH: TSH: 1.55 mIU/L

## 2016-06-29 ENCOUNTER — Encounter (HOSPITAL_COMMUNITY): Payer: Self-pay | Admitting: Psychiatry

## 2016-06-29 ENCOUNTER — Ambulatory Visit (INDEPENDENT_AMBULATORY_CARE_PROVIDER_SITE_OTHER): Payer: Medicare Other | Admitting: Psychiatry

## 2016-06-29 VITALS — BP 98/64 | HR 66 | Ht 67.5 in | Wt 113.0 lb

## 2016-06-29 DIAGNOSIS — Z79899 Other long term (current) drug therapy: Secondary | ICD-10-CM

## 2016-06-29 DIAGNOSIS — F502 Bulimia nervosa: Secondary | ICD-10-CM

## 2016-06-29 DIAGNOSIS — Z818 Family history of other mental and behavioral disorders: Secondary | ICD-10-CM | POA: Diagnosis not present

## 2016-06-29 DIAGNOSIS — F17210 Nicotine dependence, cigarettes, uncomplicated: Secondary | ICD-10-CM

## 2016-06-29 DIAGNOSIS — F1021 Alcohol dependence, in remission: Secondary | ICD-10-CM | POA: Diagnosis not present

## 2016-06-29 DIAGNOSIS — F411 Generalized anxiety disorder: Secondary | ICD-10-CM

## 2016-06-29 DIAGNOSIS — F250 Schizoaffective disorder, bipolar type: Secondary | ICD-10-CM

## 2016-06-29 DIAGNOSIS — Z811 Family history of alcohol abuse and dependence: Secondary | ICD-10-CM | POA: Diagnosis not present

## 2016-06-29 MED ORDER — LURASIDONE HCL 20 MG PO TABS: 20.0000 mg | ORAL_TABLET | Freq: Every day | ORAL | 1 refills | Status: DC

## 2016-06-29 MED ORDER — PAROXETINE HCL 40 MG PO TABS: 40.0000 mg | ORAL_TABLET | Freq: Every day | ORAL | 1 refills | Status: DC
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
## 2016-07-17 ENCOUNTER — Other Ambulatory Visit (HOSPITAL_COMMUNITY): Payer: Self-pay | Admitting: Psychiatry

## 2016-07-17 DIAGNOSIS — F502 Bulimia nervosa: Secondary | ICD-10-CM

## 2016-07-17 DIAGNOSIS — F411 Generalized anxiety disorder: Secondary | ICD-10-CM

## 2016-08-20 ENCOUNTER — Other Ambulatory Visit (HOSPITAL_COMMUNITY): Payer: Self-pay | Admitting: Psychiatry

## 2016-08-20 DIAGNOSIS — F502 Bulimia nervosa: Secondary | ICD-10-CM

## 2016-08-20 DIAGNOSIS — F411 Generalized anxiety disorder: Secondary | ICD-10-CM

## 2016-08-31 ENCOUNTER — Ambulatory Visit (HOSPITAL_COMMUNITY): Payer: Self-pay | Admitting: Psychiatry

## 2017-01-23 ENCOUNTER — Ambulatory Visit: Payer: Self-pay | Admitting: Endocrinology

## 2017-02-14 ENCOUNTER — Telehealth: Payer: Self-pay | Admitting: Endocrinology

## 2017-03-24 ENCOUNTER — Other Ambulatory Visit (HOSPITAL_COMMUNITY): Payer: Self-pay | Admitting: Psychiatry

## 2017-03-24 DIAGNOSIS — F502 Bulimia nervosa: Secondary | ICD-10-CM

## 2017-03-24 DIAGNOSIS — F411 Generalized anxiety disorder: Secondary | ICD-10-CM

## 2017-11-28 ENCOUNTER — Encounter (HOSPITAL_COMMUNITY): Payer: Self-pay

## 2017-11-28 ENCOUNTER — Encounter (HOSPITAL_COMMUNITY): Payer: Self-pay | Admitting: *Deleted

## 2017-11-28 ENCOUNTER — Other Ambulatory Visit: Payer: Self-pay

## 2017-11-28 ENCOUNTER — Inpatient Hospital Stay (HOSPITAL_COMMUNITY)
Admission: AD | Admit: 2017-11-28 | Discharge: 2017-12-04 | DRG: 885 | Disposition: A | Discharge status: Home / Self Care | Payer: 59 | Source: Intra-hospital | Attending: Psychiatry | Admitting: Psychiatry

## 2017-11-28 ENCOUNTER — Emergency Department (HOSPITAL_COMMUNITY)
Admission: EM | Admit: 2017-11-28 | Discharge: 2017-11-28 | Disposition: A | Discharge status: Other Acute Inpatient Hospital | Payer: 59 | Attending: Emergency Medicine | Admitting: Emergency Medicine

## 2017-11-28 DIAGNOSIS — R45851 Suicidal ideations: Secondary | ICD-10-CM | POA: Diagnosis present

## 2017-11-28 DIAGNOSIS — F250 Schizoaffective disorder, bipolar type: Principal | ICD-10-CM | POA: Diagnosis present

## 2017-11-28 DIAGNOSIS — F329 Major depressive disorder, single episode, unspecified: Secondary | ICD-10-CM | POA: Insufficient documentation

## 2017-11-28 DIAGNOSIS — G4700 Insomnia, unspecified: Secondary | ICD-10-CM | POA: Diagnosis present

## 2017-11-28 DIAGNOSIS — Z79899 Other long term (current) drug therapy: Secondary | ICD-10-CM | POA: Insufficient documentation

## 2017-11-28 DIAGNOSIS — F411 Generalized anxiety disorder: Secondary | ICD-10-CM | POA: Diagnosis present

## 2017-11-28 DIAGNOSIS — Z91410 Personal history of adult physical and sexual abuse: Secondary | ICD-10-CM | POA: Diagnosis not present

## 2017-11-28 DIAGNOSIS — F205 Residual schizophrenia: Secondary | ICD-10-CM | POA: Insufficient documentation

## 2017-11-28 DIAGNOSIS — K5900 Constipation, unspecified: Secondary | ICD-10-CM | POA: Diagnosis present

## 2017-11-28 DIAGNOSIS — F410 Panic disorder [episodic paroxysmal anxiety] without agoraphobia: Secondary | ICD-10-CM | POA: Diagnosis present

## 2017-11-28 DIAGNOSIS — Z590 Homelessness: Secondary | ICD-10-CM

## 2017-11-28 DIAGNOSIS — F17210 Nicotine dependence, cigarettes, uncomplicated: Secondary | ICD-10-CM | POA: Diagnosis present

## 2017-11-28 DIAGNOSIS — F509 Eating disorder, unspecified: Secondary | ICD-10-CM | POA: Diagnosis present

## 2017-11-28 DIAGNOSIS — Z811 Family history of alcohol abuse and dependence: Secondary | ICD-10-CM

## 2017-11-28 DIAGNOSIS — F332 Major depressive disorder, recurrent severe without psychotic features: Secondary | ICD-10-CM | POA: Diagnosis present

## 2017-11-28 DIAGNOSIS — F502 Bulimia nervosa: Secondary | ICD-10-CM | POA: Diagnosis not present

## 2017-11-28 DIAGNOSIS — Z560 Unemployment, unspecified: Secondary | ICD-10-CM | POA: Diagnosis not present

## 2017-11-28 DIAGNOSIS — Z818 Family history of other mental and behavioral disorders: Secondary | ICD-10-CM | POA: Diagnosis not present

## 2017-11-28 LAB — ETHANOL: Alcohol, Ethyl (B): <10 mg/dL (ref <10)

## 2017-11-28 LAB — COMPREHENSIVE METABOLIC PANEL
ALT: 14 U/L (ref 0–44)
AST: 17 U/L (ref 15–41)
Albumin: 3.9 g/dL (ref 3.5–5.0)
Alkaline Phosphatase: 32 U/L — ABNORMAL LOW (ref 38–126)
Anion gap: 11 (ref 5–15)
BUN: 11 mg/dL (ref 6–20)
CO2: 28 mmol/L (ref 22–32)
Calcium: 9.2 mg/dL (ref 8.9–10.3)
Chloride: 96 mmol/L — ABNORMAL LOW (ref 98–111)
Creatinine, Ser: 0.65 mg/dL (ref 0.44–1.00)
GFR calc Af Amer: >60 mL/min (ref >60)
GFR calc non Af Amer: >60 mL/min (ref >60)
Glucose, Bld: 93 mg/dL (ref 70–99)
Potassium: 3.7 mmol/L (ref 3.5–5.1)
Sodium: 135 mmol/L (ref 135–145)
Total Bilirubin: 1 mg/dL (ref 0.3–1.2)
Total Protein: 6.7 g/dL (ref 6.5–8.1)

## 2017-11-28 LAB — URINALYSIS, ROUTINE W REFLEX MICROSCOPIC
Bilirubin Urine: NEGATIVE
Glucose, UA: NEGATIVE mg/dL
Hgb urine dipstick: NEGATIVE
Ketones, ur: NEGATIVE mg/dL
Leukocytes, UA: NEGATIVE
Nitrite: NEGATIVE
Protein, ur: NEGATIVE mg/dL
Specific Gravity, Urine: 1.005 (ref 1.005–1.030)
pH: 9 — ABNORMAL HIGH (ref 5.0–8.0)

## 2017-11-28 LAB — PREGNANCY, URINE: Preg Test, Ur: NEGATIVE

## 2017-11-28 LAB — SALICYLATE LEVEL: Salicylate Lvl: <7 mg/dL (ref 2.8–30.0)

## 2017-11-28 LAB — CBC
HCT: 33 % — ABNORMAL LOW (ref 36.0–46.0)
Hemoglobin: 10.4 g/dL — ABNORMAL LOW (ref 12.0–15.0)
MCH: 27.5 pg (ref 26.0–34.0)
MCHC: 31.5 g/dL (ref 30.0–36.0)
MCV: 87.3 fL (ref 78.0–100.0)
Platelets: 268 10*3/uL (ref 150–400)
RBC: 3.78 MIL/uL — ABNORMAL LOW (ref 3.87–5.11)
RDW: 13.6 % (ref 11.5–15.5)
WBC: 4 10*3/uL (ref 4.0–10.5)

## 2017-11-28 LAB — ACETAMINOPHEN LEVEL: Acetaminophen (Tylenol), Serum: <10 ug/mL — ABNORMAL LOW (ref 10–30)

## 2017-11-28 LAB — I-STAT BETA HCG BLOOD, ED (MC, WL, AP ONLY): I-stat hCG, quantitative: <5 m[IU]/mL (ref <5)

## 2017-11-28 LAB — RAPID URINE DRUG SCREEN, HOSP PERFORMED
Amphetamines: NOT DETECTED
Barbiturates: NOT DETECTED
Benzodiazepines: NOT DETECTED
Cocaine: NOT DETECTED
Opiates: NOT DETECTED
Tetrahydrocannabinol: NOT DETECTED

## 2017-11-28 LAB — LIPASE, BLOOD: Lipase: 41 U/L (ref 11–51)

## 2017-11-28 MED ORDER — PAROXETINE HCL 20 MG PO TABS: 40.0000 mg | ORAL_TABLET | Freq: Every day | ORAL | Status: DC

## 2017-11-28 MED ORDER — ALUM & MAG HYDROXIDE-SIMETH 200-200-20 MG/5ML PO SUSP
30.0000 mL | ORAL | Status: DC | PRN
  Administered 2017-11-28 – 2017-12-04 (×4): 30 mL via ORAL
  Filled 2017-11-28 (×4): qty 30

## 2017-11-28 MED ORDER — FAMOTIDINE 20 MG PO TABS
20.0000 mg | ORAL_TABLET | Freq: Once | ORAL | Status: AC
  Administered 2017-11-28: 20 mg via ORAL

## 2017-11-28 MED ORDER — TRAZODONE HCL 50 MG PO TABS
50.0000 mg | ORAL_TABLET | Freq: Every evening | ORAL | Status: DC | PRN
  Filled 2017-11-28: qty 1

## 2017-11-28 MED ORDER — LURASIDONE HCL 20 MG PO TABS
20.0000 mg | ORAL_TABLET | Freq: Every day | ORAL | Status: DC
  Filled 2017-11-28 (×2): qty 1

## 2017-11-28 MED ORDER — MAGNESIUM HYDROXIDE 400 MG/5ML PO SUSP
30.0000 mL | Freq: Every day | ORAL | Status: DC | PRN
  Filled 2017-11-28: qty 30

## 2017-11-28 MED ORDER — ACETAMINOPHEN 325 MG PO TABS
650.0000 mg | ORAL_TABLET | Freq: Four times a day (QID) | ORAL | Status: DC | PRN
  Administered 2017-11-28: 650 mg via ORAL
  Filled 2017-11-28: qty 2

## 2017-11-28 MED ORDER — NICOTINE 14 MG/24HR TD PT24
14.0000 mg | MEDICATED_PATCH | Freq: Every day | TRANSDERMAL | Status: DC
  Administered 2017-11-29 – 2017-12-04 (×6): 14 mg via TRANSDERMAL
  Filled 2017-11-28 (×9): qty 1

## 2017-11-28 MED ORDER — PAROXETINE HCL 20 MG PO TABS
40.0000 mg | ORAL_TABLET | Freq: Every day | ORAL | Status: DC
  Filled 2017-11-28 (×2): qty 2

## 2017-11-28 MED ORDER — HYDROXYZINE HCL 25 MG PO TABS
25.0000 mg | ORAL_TABLET | Freq: Three times a day (TID) | ORAL | Status: DC | PRN
  Administered 2017-11-28 – 2017-11-29 (×2): 25 mg via ORAL
  Filled 2017-11-28 (×2): qty 1

## 2017-11-28 MED ORDER — NICOTINE 7 MG/24HR TD PT24
7.0000 mg | MEDICATED_PATCH | Freq: Once | TRANSDERMAL | Status: DC
  Administered 2017-11-28: 7 mg via TRANSDERMAL

## 2017-11-28 MED ORDER — LURASIDONE HCL 20 MG PO TABS: 20.0000 mg | ORAL_TABLET | Freq: Every day | ORAL | Status: DC

## 2017-11-28 NOTE — ED Notes (Signed)
Transported to [facility] by [employer]. All belongings returned to pt who signed for same. Pt was calm and cooperative. Report had been given previously by Langley Gauss RN to [employer].
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
## 2017-11-28 NOTE — Progress Notes (Signed)
The patient verbalized that she had a "challenging day" and was grateful for the help. Her goal for tomorrow is to work on her deep breathing strategies.

## 2017-11-28 NOTE — ED Notes (Signed)
Patient denies pain and is resting comfortably.  

## 2017-11-28 NOTE — ED Notes (Signed)
Report called to TTS.  Pt will be in room 42.  She was asked to be moved after her lunch tray arrives
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
## 2017-11-28 NOTE — ED Notes (Addendum)
Notes pt to arrive on unit at 1800 via [place], [facility]). [name] noted admission nurse will call for report
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
## 2017-11-28 NOTE — BH Assessment (Addendum)
[facility] Assessment Progress Note  Per Buford Dresser, DO, this pt requires psychiatric hospitalization at this time.  Leonia Reader, RN, [facility] has assigned pt to [id number]; [facility] will be ready to receive pt at 16:30.  Pt has signed Voluntary Admission and Consent for Treatment, as well as Consent to Release Information to family members, and signed forms have been faxed to [facility].  Pt's nurse, Diane, has been notified, and agrees to send original paperwork along with pt via B[place]y Pries, and to call report to [phone].  Jalene Mullet, [place] Coordinator [phone]

## 2017-11-28 NOTE — ED Notes (Signed)
Bed: [facility] Expected date:  Expected time:  Means of arrival:  Comments: 

## 2017-11-28 NOTE — ED Notes (Signed)
Pt belongings locked up in hall closet. Belongings: Visa #[id number], [place], pink/purple wallet, black cell phone, I-phone w/pinkcase, 2 pairs of glasses, 2 vapes and products, pens, back pack, charger (2), earbuds (1), Bible, Notebook, book, pillow, brown sandals, jean shirt, black tank top, pink shirt, white bra, gray socks. Belongings itemized on belongings sheet located in pts chart at nurses station. Pt signed belongings sheet acknowledging her understanding that belongings would remain locked up until discharge.

## 2017-11-28 NOTE — Progress Notes (Signed)
Smokes ppd 1.5
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
## 2017-11-28 NOTE — ED Notes (Signed)
Report given to [place], Therapist, sports. It is understood not to leave until call from [place], [place] admission nurse.

## 2017-11-29 DIAGNOSIS — F332 Major depressive disorder, recurrent severe without psychotic features: Secondary | ICD-10-CM

## 2017-11-29 DIAGNOSIS — Z91410 Personal history of adult physical and sexual abuse: Secondary | ICD-10-CM

## 2017-11-29 DIAGNOSIS — F502 Bulimia nervosa: Secondary | ICD-10-CM

## 2017-11-29 DIAGNOSIS — Z811 Family history of alcohol abuse and dependence: Secondary | ICD-10-CM

## 2017-11-29 DIAGNOSIS — R45851 Suicidal ideations: Secondary | ICD-10-CM

## 2017-11-29 DIAGNOSIS — Z818 Family history of other mental and behavioral disorders: Secondary | ICD-10-CM

## 2017-11-29 LAB — RETICULOCYTES
RBC.: 4.02 MIL/uL (ref 3.87–5.11)
Retic Count, Absolute: 44.2 10*3/uL (ref 19.0–186.0)
Retic Ct Pct: 1.1 % (ref 0.4–3.1)

## 2017-11-29 LAB — IRON AND TIBC
Iron: 11 ug/dL — ABNORMAL LOW (ref 28–170)
Saturation Ratios: 3 % — ABNORMAL LOW (ref 10.4–31.8)
TIBC: 383 ug/dL (ref 250–450)
UIBC: 372 ug/dL

## 2017-11-29 LAB — FOLATE: Folate: 20.9 ng/mL (ref >5.9)

## 2017-11-29 LAB — VITAMIN B12: Vitamin B-12: 459 pg/mL (ref 180–914)

## 2017-11-29 LAB — FERRITIN: Ferritin: 4 ng/mL — ABNORMAL LOW (ref 11–307)

## 2017-11-29 MED ORDER — ZIPRASIDONE HCL 20 MG PO CAPS
20.0000 mg | ORAL_CAPSULE | Freq: Two times a day (BID) | ORAL | Status: DC
  Administered 2017-11-29 – 2017-11-30 (×3): 20 mg via ORAL
  Filled 2017-11-29 (×6): qty 1

## 2017-11-29 MED ORDER — POLYETHYLENE GLYCOL 3350 17 G PO PACK
17.0000 g | PACK | Freq: Every day | ORAL | Status: DC | PRN
  Administered 2017-11-29 – 2017-12-04 (×5): 17 g via ORAL
  Filled 2017-11-29 (×6): qty 1

## 2017-11-29 MED ORDER — PAROXETINE HCL 10 MG PO TABS
10.0000 mg | ORAL_TABLET | Freq: Every day | ORAL | Status: DC
  Administered 2017-11-30 – 2017-12-01 (×2): 10 mg via ORAL
  Filled 2017-11-29 (×4): qty 1
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
## 2017-11-29 NOTE — Progress Notes (Signed)
Pt reports she had a difficult day and feels a "darkness" over her.  She denies thoughts to hurt herself, but says she would like to be dead.  She denies HI/AVH although she does dwell on the "darkness" she feels.  Pt was observed sitting in the dayroom most of the evening talking occasionally to other patients, but mostly staying to herself.  She received per request Maalox for heartburn and Miralax for constipation this evening at bedtime.  She voiced no other needs or concerns.  Pt seemed relaxed with Probation officer during conversation.  She makes her needs known to staff.  Support and encouragement offered.  Discharge plans are in process.  Safety maintained with q15 minute checks.

## 2017-11-29 NOTE — Plan of Care (Signed)
  Problem: Coping: Goal: Ability to verbalize frustrations and anger appropriately will improve Outcome: Progressing   Problem: Safety: Goal: Periods of time without injury will increase Outcome: Progressing   
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
## 2017-11-29 NOTE — BHH Group Notes (Signed)
[facility] LCSW Group Therapy Note  Date/Time: 11/29/17, 1315  Type of Therapy/Topic:  Group Therapy:  Balance in Life  Participation Level:  minimal  Description of Group:    This group will address the concept of balance and how it feels and looks when one is unbalanced. Patients will be encouraged to process areas in their lives that are out of balance, and identify reasons for remaining unbalanced. Facilitators will guide patients utilizing problem- solving interventions to address and correct the stressor making their life unbalanced. Understanding and applying boundaries will be explored and addressed for obtaining  and maintaining a balanced life. Patients will be encouraged to explore ways to assertively make their unbalanced needs known to significant others in their lives, using other group members and facilitator for support and feedback.  Therapeutic Goals: 1. Patient will identify two or more emotions or situations they have that consume much of in their lives. 2. Patient will identify signs/triggers that life has become out of balance:  3. Patient will identify two ways to set boundaries in order to achieve balance in their lives:  4. Patient will demonstrate ability to communicate their needs through discussion and/or role plays  Summary of Patient Progress:Pt shared that hopelessness is dominating her life currently.  Pt was only minimally attentive during group and appeared to be asleep at one point.         Therapeutic Modalities:   Cognitive Behavioral Therapy Solution-Focused Therapy Assertiveness Training  Lurline Idol, [place]

## 2017-11-29 NOTE — Progress Notes (Signed)
Pt noted to be anxious, paranoid and worried on approach. Pt stated to Probation officer, I can't talk out here, I feel violated". Pt appeared wide eyed, fidgety and afraid during shift assessment. Pt is suspicious and feels like people are listening to her conversations and talking about her. Pt reported feeling paranoid. Pt reported AVH. Pt expressed  that there's a dark figure trying to control her and bring her down. Pt verbalized that the dark figure takes her to dark places. Pt speech is tangential and thoughts are disorganizes. Pt redirected by staff as needed.  Medications reviewed with pt. Medications administered as ordered per MD. Verbal support provided. Pt encouraged to attend groups as tolerated. 15 minute checks performed for safety.  No concerns verbalized by pt. Pt compliant with tx plan.

## 2017-11-30 MED ORDER — ZIPRASIDONE HCL 40 MG PO CAPS
40.0000 mg | ORAL_CAPSULE | Freq: Two times a day (BID) | ORAL | Status: DC
  Administered 2017-11-30 – 2017-12-01 (×2): 40 mg via ORAL
  Filled 2017-11-30 (×6): qty 1

## 2017-11-30 MED ORDER — DOCUSATE SODIUM 100 MG PO CAPS
100.0000 mg | ORAL_CAPSULE | Freq: Every day | ORAL | Status: DC
  Administered 2017-11-30 – 2017-12-04 (×5): 100 mg via ORAL
  Filled 2017-11-30 (×8): qty 1

## 2017-11-30 NOTE — Progress Notes (Signed)
D    Pt is depressed and sad   Her affect is flat her thought proceses are slow    Pt requested some miralax and some mylanta for gas   She wanted diet soda with her miralax   Pt is emaciated and appears weak   She denies poor eating habits and remains focused on low calories and laxatives A    Verbal support given   Medications administered and effectiveness monitored    Q 15 min checks  R   Pt is safe at this time

## 2017-11-30 NOTE — [facility] Group Notes (Signed)
LCSW Group Therapy Note 11/30/2017 2:12 PM  Type of Therapy/Topic: Group Therapy: Emotion Regulation  Participation Level: Active   Description of Group:  The purpose of this group is to assist patients in learning to regulate negative emotions and experience positive emotions. Patients will be guided to discuss ways in which they have been vulnerable to their negative emotions. These vulnerabilities will be juxtaposed with experiences of positive emotions or situations, and patients will be challenged to use positive emotions to combat negative ones. Special emphasis will be placed on coping with negative emotions in conflict situations, and patients will process healthy conflict resolution skills.  Therapeutic Goals: 1. Patient will identify two positive emotions or experiences to reflect on in order to balance out negative emotions 2. Patient will label two or more emotions that they find the most difficult to experience 3. Patient will demonstrate positive conflict resolution skills through discussion and/or role plays  Summary of Patient Progress:  Cassandra Cunningham was engaged and participated throughout the group session. Cassandra Cunningham states that fear and rejection are emotions she finds challenging to regulate. Cassandra Cunningham reports that she dealt with her fear and rejection by isolating. Cassandra Cunningham reports she believes she wants to remove herself from a personal situation to better regulate her emotions.       Therapeutic Modalities:  Cognitive Behavioral Therapy Feelings Identification Dialectical Behavioral Therapy   Theresa Duty Clinical Social Worker
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
## 2017-11-30 NOTE — Plan of Care (Signed)
Progress note  D: pt found in the dayroom; compliant with medication administration. Pt still complains of constipation. Pt rates her depression/hopelessness/anxiety a 5/8/6 out of 10 respectively. Pt states her goal for today is to be content, and practice breathing exercises. Pt will achieve this by focusing on here and now and stop dwelling on what is out of her control. Pt denies any physical pain rating this a 0/10.  A: pt provided support and encouragement. Pt given medications per protocol and standing orders. Q57m safety checks implemented and continued.  R: pt safe on the unit. Will continue to monitor.  Pt progressing in the following metrics  Problem: Education: Goal: Emotional status will improve Outcome: Progressing Goal: Mental status will improve Outcome: Progressing Goal: Verbalization of understanding the information provided will improve Outcome: Progressing   Problem: Activity: Goal: Interest or engagement in activities will improve Outcome: Progressing   Problem: Coping: Goal: Ability to demonstrate self-control will improve Outcome: Progressing   Problem: Health Behavior/Discharge Planning: Goal: Identification of resources available to assist in meeting health care needs will improve Outcome: Progressing Goal: Compliance with treatment plan for underlying cause of condition will improve Outcome: Progressing

## 2017-11-30 NOTE — Tx Team (Signed)
Interdisciplinary Treatment and Diagnostic Plan Update  11/30/2017 Time of Session: 9:40am [name] MRN: [id number]  Principal Diagnosis: <principal problem not specified>  Secondary Diagnoses: Active Problems:   MDD (major depressive disorder), recurrent severe, without psychosis ([place])   Current Medications:  Current Facility-Administered Medications  Medication Dose Route Frequency Provider Last Rate Last Dose  . acetaminophen (TYLENOL) tablet 650 mg  650 mg Oral Q6H PRN Ethelene Hal, NP   650 mg at 11/28/17 2130  . alum & mag hydroxide-simeth (MAALOX/MYLANTA) 200-200-20 MG/5ML suspension 30 mL  30 mL Oral Q4H PRN Ethelene Hal, NP   30 mL at 11/29/17 2126  . hydrOXYzine (ATARAX/VISTARIL) tablet 25 mg  25 mg Oral TID PRN Ethelene Hal, NP   25 mg at 11/29/17 2125  . nicotine (NICODERM CQ - dosed in mg/24 hours) patch 14 mg  14 mg Transdermal Daily Sharma Covert, MD   14 mg at 11/30/17 0749  . PARoxetine (PAXIL) tablet 10 mg  10 mg Oral Daily Sharma Covert, MD   10 mg at 11/30/17 0750  . polyethylene glycol (MIRALAX / GLYCOLAX) packet 17 g  17 g Oral Daily PRN Sharma Covert, MD   17 g at 11/29/17 2126  . traZODone (DESYREL) tablet 50 mg  50 mg Oral QHS PRN Ethelene Hal, NP      . ziprasidone (GEODON) capsule 20 mg  20 mg Oral BID WC Sharma Covert, MD   20 mg at 11/30/17 9528   PTA Medications: Medications Prior to Admission  Medication Sig Dispense Refill Last Dose  . diphenhydrAMINE (BANOPHEN) 25 mg capsule Take 25-50 mg by mouth at bedtime as needed for sleep.   unknown  . LORazepam (ATIVAN) 1 MG tablet Take 0.5-1 mg by mouth daily as needed for anxiety.   11/27/2017  . magnesium oxide (MAG-OX) 400 MG tablet Take 400 mg by mouth at bedtime.   11/27/2017  . Multiple Vitamin (MULTIVITAMIN WITH MINERALS) TABS tablet Take 1 tablet by mouth daily.   11/28/2017  . S-Adenosylmethionine (SAME PO) Take 1 tablet by mouth daily.    11/27/2017    Patient Stressors: Marital or family conflict Medication change or noncompliance  Patient Strengths: Ability for insight Average or above average intelligence Capable of independent living General fund of knowledge  Treatment Modalities: Medication Management, Group therapy, Case management,  1 to 1 session with clinician, Psychoeducation, Recreational therapy.   Physician Treatment Plan for Primary Diagnosis: <principal problem not specified> Long Term Goal(s): Improvement in symptoms so as ready for discharge Improvement in symptoms so as ready for discharge   Short Term Goals: Ability to identify changes in lifestyle to reduce recurrence of condition will improve Ability to verbalize feelings will improve Ability to disclose and discuss suicidal ideas Ability to demonstrate self-control will improve Ability to identify and develop effective coping behaviors will improve Ability to maintain clinical measurements within normal limits will improve Compliance with prescribed medications will improve Ability to identify changes in lifestyle to reduce recurrence of condition will improve Ability to verbalize feelings will improve Ability to disclose and discuss suicidal ideas Ability to demonstrate self-control will improve Ability to identify and develop effective coping behaviors will improve Ability to maintain clinical measurements within normal limits will improve Compliance with prescribed medications will improve  Medication Management: Evaluate patient's response, side effects, and tolerance of medication regimen.  Therapeutic Interventions: 1 to 1 sessions, Unit Group sessions and Medication administration.  Evaluation of Outcomes: Not  Met  Physician Treatment Plan for Secondary Diagnosis: Active Problems:   MDD (major depressive disorder), recurrent severe, without psychosis ([place])  Long Term Goal(s): Improvement in symptoms so as ready for  discharge Improvement in symptoms so as ready for discharge   Short Term Goals: Ability to identify changes in lifestyle to reduce recurrence of condition will improve Ability to verbalize feelings will improve Ability to disclose and discuss suicidal ideas Ability to demonstrate self-control will improve Ability to identify and develop effective coping behaviors will improve Ability to maintain clinical measurements within normal limits will improve Compliance with prescribed medications will improve Ability to identify changes in lifestyle to reduce recurrence of condition will improve Ability to verbalize feelings will improve Ability to disclose and discuss suicidal ideas Ability to demonstrate self-control will improve Ability to identify and develop effective coping behaviors will improve Ability to maintain clinical measurements within normal limits will improve Compliance with prescribed medications will improve     Medication Management: Evaluate patient's response, side effects, and tolerance of medication regimen.  Therapeutic Interventions: 1 to 1 sessions, Unit Group sessions and Medication administration.  Evaluation of Outcomes: Not Met   RN Treatment Plan for Primary Diagnosis: <principal problem not specified> Long Term Goal(s): Knowledge of disease and therapeutic regimen to maintain health will improve  Short Term Goals: Ability to participate in decision making will improve, Ability to disclose and discuss suicidal ideas, Ability to identify and develop effective coping behaviors will improve and Compliance with prescribed medications will improve  Medication Management: RN will administer medications as ordered by provider, will assess and evaluate patient's response and provide education to patient for prescribed medication. RN will report any adverse and/or side effects to prescribing provider.  Therapeutic Interventions: 1 on 1 counseling sessions,  Psychoeducation, Medication administration, Evaluate responses to treatment, Monitor vital signs and CBGs as ordered, Perform/monitor CIWA, COWS, AIMS and Fall Risk screenings as ordered, Perform wound care treatments as ordered.  Evaluation of Outcomes: Not Met   LCSW Treatment Plan for Primary Diagnosis: <principal problem not specified> Long Term Goal(s): Safe transition to appropriate next level of care at discharge, Engage patient in therapeutic group addressing interpersonal concerns.  Short Term Goals: Engage patient in aftercare planning with referrals and resources and Increase skills for wellness and recovery  Therapeutic Interventions: Assess for all discharge needs, 1 to 1 time with Social worker, Explore available resources and support systems, Assess for adequacy in community support network, Educate family and significant other(s) on suicide prevention, Complete Psychosocial Assessment, Interpersonal group therapy.  Evaluation of Outcomes: Not Met   Progress in Treatment: Attending groups: Yes. Participating in groups: Yes. Minimally  Taking medication as prescribed: Yes. Toleration medication: Yes. Family/Significant other contact made: No, will contact:  the patient's daughter Patient understands diagnosis: Yes. Discussing patient identified problems/goals with staff: Yes. Medical problems stabilized or resolved: Yes. Denies suicidal/homicidal ideation: No. Issues/concerns per patient self-inventory: No. Other:   New problem(s) identified: None  New Short Term/Long Term Goal(s):medication stabilization, elimination of SI thoughts, development of comprehensive mental wellness plan.   Patient Goals:  "To be able to get well and stay well"  Discharge Plan or Barriers: CSW will assess for appropriate referrals and discharge plans.   Reason for Continuation of Hospitalization: Anxiety Depression Medication stabilization Suicidal ideation  Estimated Length of Stay:  3-5 days   Attendees: Patient: Cassandra Cunningham 11/30/2017 8:42 AM  Physician: Dr. Myles Lipps, MD 11/30/2017 8:42 AM  Nursing: Demaris Callander 11/30/2017 8:42 AM  RN  Care Manager: X 11/30/2017 8:42 AM  Social Worker: Radonna Ricker, [place] 11/30/2017 8:42 AM  Recreational Therapist: X 11/30/2017 8:42 AM  Other:  11/30/2017 8:42 AM  Other:  11/30/2017 8:42 AM  Other: 11/30/2017 8:42 AM    Scribe for Treatment Team: Marylee Floras, [place] 11/30/2017 8:42 AM

## 2017-11-30 NOTE — [facility] Suicide Risk Assessment (Signed)
[place] INPATIENT:  Family/Significant Other Suicide Prevention Education  Suicide Prevention Education:  Contact Attempts: Tera Partridge, daughter [phone])  has been identified by the patient as the family member/significant other with whom the patient will be residing, and identified as the person(s) who will aid the patient in the event of a mental health crisis.  With written consent from the patient, two attempts were made to provide suicide prevention education, prior to and/or following the patient's discharge.  We were unsuccessful in providing suicide prevention education.  A suicide education pamphlet was given to the patient to share with family/significant other.  Date and time of first attempt:11/30/17 / 2:46pm   Cassandra Cunningham 11/30/2017, 2:46 PM

## 2017-12-01 MED ORDER — ZIPRASIDONE HCL 40 MG PO CAPS
40.0000 mg | ORAL_CAPSULE | Freq: Every day | ORAL | Status: DC
  Filled 2017-12-01 (×2): qty 1

## 2017-12-01 MED ORDER — ZIPRASIDONE HCL 60 MG PO CAPS
60.0000 mg | ORAL_CAPSULE | Freq: Every day | ORAL | Status: DC
  Administered 2017-12-01: 60 mg via ORAL
  Filled 2017-12-01 (×3): qty 1

## 2017-12-01 MED ORDER — PAROXETINE HCL 20 MG PO TABS
20.0000 mg | ORAL_TABLET | Freq: Every day | ORAL | Status: DC
  Administered 2017-12-02 – 2017-12-04 (×3): 20 mg via ORAL
  Filled 2017-12-01 (×5): qty 1

## 2017-12-01 MED ORDER — PAROXETINE HCL 10 MG PO TABS
10.0000 mg | ORAL_TABLET | Freq: Once | ORAL | Status: AC
  Administered 2017-12-01: 10 mg via ORAL
  Filled 2017-12-01: qty 1

## 2017-12-01 NOTE — Progress Notes (Signed)
D    Pt is depressed and sad   Her affect is flat her thought proceses are slow    Pt    Pt is emaciated and appears weak   She denies poor eating habits and remains focused on low calories and laxatives  She has secluded this evening missing group and socialization A    Verbal support given   Medications administered and effectiveness monitored    Q 15 min checks  R   Pt is safe at this time

## 2017-12-01 NOTE — Progress Notes (Signed)
Adult Psychoeducational Group Note  Date:  12/01/2017 Time:  1:41 PM  Group Topic/Focus:  Goals Group:   The focus of this group is to help patients establish daily goals to achieve during treatment and discuss how the patient can incorporate goal setting into their daily lives to aide in recovery.  Participation Level:  Active  Participation Quality:  Appropriate  Affect:  Appropriate  Cognitive:  Alert  Insight: Good  Engagement in Group:  Engaged  Modes of Intervention:  Discussion  Additional Comments:  Pt attended group and participated in group discussion.  Dalina Samara R Violeta Lecount 12/01/2017, 1:41 PM
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
## 2017-12-01 NOTE — BHH Group Notes (Signed)
[facility] Group Notes:  (Nursing/MHT/Case Management/Adjunct)  Date:  12/01/2017  Time:  1:15 PM  Type of Therapy:  Nurse Education  Participation Level:  Did Not Attend   Baron Sane 12/01/2017, 2:42 PM
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
## 2017-12-01 NOTE — Progress Notes (Signed)
Adult Psychoeducational Group Note  Date:  12/01/2017 Time:  1:52 AM  Group Topic/Focus:  Wrap-Up Group:   The focus of this group is to help patients review their daily goal of treatment and discuss progress on daily workbooks.  Participation Level:  Did Not Attend  Participation Quality:  Pt did not attend  Affect:  Pt did not attend  Cognitive:  Pt did not attend  Insight: None  Engagement in Group:  Pt did not attend  Modes of Intervention:  Pt did not attend  Additional Comments:  Pt did not attend evening wrap up group tonight.  Candy Sledge 12/01/2017, 1:52 AM

## 2017-12-01 NOTE — Progress Notes (Signed)
Adult Psychoeducational Group Note  Date:  12/01/2017 Time:  9:56 PM  Group Topic/Focus:  Wrap-Up Group:   The focus of this group is to help patients review their daily goal of treatment and discuss progress on daily workbooks.  Participation Level:  Did Not Attend  Participation Quality:  Did not attend  Affect:  Did not attend  Cognitive:  Did not attend  Insight: None  Engagement in Group:  Did not attend  Modes of Intervention:  Did not attend  Additional Comments:  Pt did not attend evening wrap up group tonight.  Candy Sledge 12/01/2017, 9:56 PM
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
## 2017-12-02 MED ORDER — QUETIAPINE FUMARATE 100 MG PO TABS
100.0000 mg | ORAL_TABLET | Freq: Every day | ORAL | Status: DC
  Administered 2017-12-02 – 2017-12-03 (×2): 100 mg via ORAL
  Filled 2017-12-02 (×5): qty 1

## 2017-12-02 NOTE — BHH Group Notes (Signed)
Adult Psychoeducational Group Note  Date:  12/02/2017 Time:  10:12 AM  Group Topic/Focus:  Goals Group:   The focus of this group is to help patients establish daily goals to achieve during treatment and discuss how the patient can incorporate goal setting into their daily lives to aide in recovery.  Participation Level:  Active  Participation Quality:  Appropriate  Affect:  Appropriate  Cognitive:  Alert  Insight: Appropriate  Engagement in Group:  Engaged  Modes of Intervention:  Orientation  Additional Comments:  Pt attended and orientation/goals group.   Huel Cote 12/02/2017, 10:12 AM
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
## 2017-12-02 NOTE — Plan of Care (Signed)
Progress Note  D: pt found in the dayroom interacting with peers; pt compliant with medication administration. Pt states she slept well last night. Pt denied one of her medications that she wanted to speak with the doctor about before ingesting again. The pt thinks it was inciting "panic attack" feelings with her breathing. Pt rates her depression/hopelessness/anxiety at a 5/5/3 out of 10 respectively. Pt has no physical complaints except for mild constipation still. Pt rates her pain at a 2/10 in her back and lower body. Pt states her goal for today is getting out of here with a safe place to go to and continue to use coping skills. Pt failed to state how she would achieve this. Pt denies any si/hi/ah/vh and verbally agrees to approach staff if these become apparent.  A: pt provided support and encouragement. Pt given medications per protocol and standing orders. Pt given information in medication and [employer]. Q59m safety checks implemented and continued.  R: pt safe on the unit. Will continue to monitor.   Pt progressing in the following metrics  Problem: Coping: Goal: Will verbalize feelings Outcome: Progressing   Problem: Health Behavior/Discharge Planning: Goal: Ability to make decisions will improve Outcome: Progressing Goal: Compliance with therapeutic regimen will improve Outcome: Progressing   Problem: Role Relationship: Goal: Will demonstrate positive changes in social behaviors and relationships Outcome: Progressing   Problem: Safety: Goal: Ability to disclose and discuss suicidal ideas will improve Outcome: Progressing Goal: Ability to identify and utilize support systems that promote safety will improve Outcome: Progressing   Problem: Self-Concept: Goal: Will verbalize positive feelings about self Outcome: Progressing Goal: Level of anxiety will decrease Outcome: Progressing

## 2017-12-02 NOTE — Progress Notes (Signed)
D.  Pt in bed, did not get up for evening group as she had received sleep medication early tonight.  Pt denies SI/HI/AVH at this time.  Pt resting in bed with eyes closed, no distress noted.  A.  Support and encouragement offered  R.  Pt remains safe on the unit, will continue to monitor.

## 2017-12-02 NOTE — [facility] Group Notes (Signed)
[place] Group Notes:  (Nursing/MHT/Case Management/Adjunct)  Date:  12/02/2017  Time:  1:15 PM  Type of Therapy:  Nurse Education  Participation Level:  Active  Participation Quality:  Appropriate  Affect:  Appropriate  Cognitive:  Appropriate  Insight:  Appropriate  Engagement in Group:  Engaged  Modes of Intervention:  Discussion and Education  Summary of Progress/Problems:pt attended and was engaged in education/discussion.   Otelia Limes Ezariah Nace 12/02/2017, 3:33 PM
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
## 2017-12-02 NOTE — [facility] Group Notes (Signed)
Pt was alert and engaged in therapeutic relaxation activity.

## 2017-12-03 NOTE — [facility] Suicide Risk Assessment (Signed)
[facility] INPATIENT:  Family/Significant Other Suicide Prevention Education  Suicide Prevention Education: Eisley Barber, niece [phone]) has been identified by the patient as the family member/significant other with whom the patient will be residing, and identified as the person(s) who will aid the patient in the event of a mental health crisis (suicidal ideations/suicide attempt).  With written consent from the patient, the family member/significant other has been provided the following suicide prevention education, prior to the and/or following the discharge of the patient.  The suicide prevention education provided includes the following:  Suicide risk factors  Suicide prevention and interventions  National Suicide Hotline telephone number  [employer] assessment telephone number  [facility] Emergency Assistance [place] and/or Residential Mobile Crisis Unit telephone number  Request made of family/significant other to:  Remove weapons (e.g., guns, rifles, knives), all items previously/currently identified as safety concern.    Remove drugs/medications (over-the-counter, prescriptions, illicit drugs), all items previously/currently identified as a safety concern.  The family member/significant other verbalizes understanding of the suicide prevention education information provided.  The family member/significant other agrees to remove the items of safety concern listed above.  Patient's niece reports that the patient is going to live with her at discharge. Patient's niece reports she does not have any questions or concerns.   Marylee Floras 12/03/2017, 2:08 PM

## 2017-12-03 NOTE — [facility] Group Notes (Signed)
[facility] LCSW Group Therapy Note  Date/Time: 12/03/17, 1315  Type of Therapy and Topic:  Group Therapy:  Overcoming Obstacles  Participation Level:  active  Description of Group:    In this group patients will be encouraged to explore what they see as obstacles to their own wellness and recovery. They will be guided to discuss their thoughts, feelings, and behaviors related to these obstacles. The group will process together ways to cope with barriers, with attention given to specific choices patients can make. Each patient will be challenged to identify changes they are motivated to make in order to overcome their obstacles. This group will be process-oriented, with patients participating in exploration of their own experiences as well as giving and receiving support and challenge from other group members.  Therapeutic Goals: 1. Patient will identify personal and current obstacles as they relate to admission. 2. Patient will identify barriers that currently interfere with their wellness or overcoming obstacles.  3. Patient will identify feelings, thought process and behaviors related to these barriers. 4. Patient will identify two changes they are willing to make to overcome these obstacles:    Summary of Patient Progress: Pt shared that depression and fear are current obstacles.  Pt active in group discussion regarding ways to overcome obstacles.      Therapeutic Modalities:   Cognitive Behavioral Therapy Solution Focused Therapy Motivational Interviewing Relapse Prevention Therapy  Lurline Idol, LCSW
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
## 2017-12-03 NOTE — [facility] Suicide Risk Assessment (Signed)
[place] INPATIENT:  Family/Significant Other Suicide Prevention Education  Suicide Prevention Education:  Contact Attempts: Tera Partridge, daughter [phone]) has been identified by the patient as the family member/significant other with whom the patient will be residing, and identified as the person(s) who will aid the patient in the event of a mental health crisis.  With written consent from the patient, two attempts were made to provide suicide prevention education, prior to and/or following the patient's discharge.  We were unsuccessful in providing suicide prevention education.  A suicide education pamphlet was given to the patient to share with family/significant other.   Date and time of second attempt:12/03/17 /2:05pm  Marylee Floras 12/03/2017, 2:05 PM

## 2017-12-03 NOTE — Progress Notes (Signed)
Recreation Therapy Notes  Date: 9.16.19 Time: 0930 Location: [address] Dayroom  Group Topic: Stress Management  Goal Area(s) Addresses:  Patient will verbalize importance of using healthy stress management.  Patient will identify positive emotions associated with healthy stress management.   Intervention: Stress Management  Activity : Guided Imagery.  LRT introduced the stress management technique of guided imagery.  LRT read a script for a forest meditation.  Patients were to follow along as the script was read to engage in the activity.  Education:  Stress Management, Discharge Planning.   Education Outcome: Acknowledges edcuation/In group clarification offered/Needs additional education  Clinical Observations/Feedback: Pt did not attend group.    Victorino Sparrow, LRT/CTRS         Victorino Sparrow A 12/03/2017 12:16 PM

## 2017-12-03 NOTE — [facility] Group Notes (Signed)
[place] Group Notes:  (Nursing/MHT/Case Management/Adjunct)  Date:  12/03/2017  Time:  1:15 PM  Type of Therapy:  Nurse Education  Participation Level:  Active  Participation Quality:  Appropriate and Attentive  Affect:  Appropriate  Cognitive:  Alert and Appropriate  Insight:  Appropriate  Engagement in Group:  Engaged  Modes of Intervention:  Discussion and Education  Summary of Progress/Problems: Pt was engaged and appropriate during group therapy regarding unit rules and regulations.  Cassandra Cunningham 12/03/2017, 5:24 PM
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
## 2017-12-04 DIAGNOSIS — F250 Schizoaffective disorder, bipolar type: Principal | ICD-10-CM

## 2017-12-04 DIAGNOSIS — Z560 Unemployment, unspecified: Secondary | ICD-10-CM

## 2017-12-04 DIAGNOSIS — F17210 Nicotine dependence, cigarettes, uncomplicated: Secondary | ICD-10-CM

## 2017-12-04 MED ORDER — QUETIAPINE FUMARATE 100 MG PO TABS: 100.0000 mg | ORAL_TABLET | Freq: Every day | ORAL | 0 refills | Status: DC

## 2017-12-04 MED ORDER — HYDROXYZINE HCL 25 MG PO TABS: 25.0000 mg | ORAL_TABLET | Freq: Three times a day (TID) | ORAL | 0 refills | Status: DC | PRN

## 2017-12-04 MED ORDER — PAROXETINE HCL 20 MG PO TABS: 20.0000 mg | ORAL_TABLET | Freq: Every day | ORAL | 0 refills | Status: DC
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
## 2017-12-04 NOTE — Plan of Care (Signed)
Discharge Note  Patient verbalizes readiness for discharge. Follow up plan explained, AVS, Transition record and SRA given. Prescriptions and teaching provided. Belongings returned and signed for. Suicide safety plan completed and signed. Patient verbalizes understanding. Patient denies SI/HI and assures this Probation officer he will seek assistance should that change. Patient discharged to lobby where patient was given bus pass and instructions on where to board bus.  Problem: Education: Goal: Knowledge of [place] General Education information/materials will improve Outcome: Adequate for Discharge Goal: Emotional status will improve Outcome: Adequate for Discharge Goal: Mental status will improve Outcome: Adequate for Discharge Goal: Verbalization of understanding the information provided will improve Outcome: Adequate for Discharge   Problem: Activity: Goal: Interest or engagement in activities will improve Outcome: Adequate for Discharge Goal: Sleeping patterns will improve Outcome: Adequate for Discharge   Problem: Coping: Goal: Ability to verbalize frustrations and anger appropriately will improve Outcome: Adequate for Discharge Goal: Ability to demonstrate self-control will improve Outcome: Adequate for Discharge   Problem: Health Behavior/Discharge Planning: Goal: Identification of resources available to assist in meeting health care needs will improve Outcome: Adequate for Discharge Goal: Compliance with treatment plan for underlying cause of condition will improve Outcome: Adequate for Discharge   Problem: Physical Regulation: Goal: Ability to maintain clinical measurements within normal limits will improve Outcome: Adequate for Discharge   Problem: Safety: Goal: Periods of time without injury will increase Outcome: Adequate for Discharge   Problem: Education: Goal: Utilization of techniques to improve thought processes will improve Outcome: Adequate for Discharge Goal:  Knowledge of the prescribed therapeutic regimen will improve Outcome: Adequate for Discharge   Problem: Activity: Goal: Interest or engagement in leisure activities will improve Outcome: Adequate for Discharge Goal: Imbalance in normal sleep/wake cycle will improve Outcome: Adequate for Discharge   Problem: Coping: Goal: Coping ability will improve Outcome: Adequate for Discharge Goal: Will verbalize feelings Outcome: Adequate for Discharge   Problem: Health Behavior/Discharge Planning: Goal: Ability to make decisions will improve Outcome: Adequate for Discharge Goal: Compliance with therapeutic regimen will improve Outcome: Adequate for Discharge   Problem: Role Relationship: Goal: Will demonstrate positive changes in social behaviors and relationships Outcome: Adequate for Discharge   Problem: Safety: Goal: Ability to disclose and discuss suicidal ideas will improve Outcome: Adequate for Discharge Goal: Ability to identify and utilize support systems that promote safety will improve Outcome: Adequate for Discharge   Problem: Self-Concept: Goal: Will verbalize positive feelings about self Outcome: Adequate for Discharge Goal: Level of anxiety will decrease Outcome: Adequate for Discharge   Problem: Education: Goal: Ability to make informed decisions regarding treatment will improve Outcome: Adequate for Discharge   Problem: Coping: Goal: Coping ability will improve Outcome: Adequate for Discharge   Problem: Health Behavior/Discharge Planning: Goal: Identification of resources available to assist in meeting health care needs will improve Outcome: Adequate for Discharge   Problem: Medication: Goal: Compliance with prescribed medication regimen will improve Outcome: Adequate for Discharge   Problem: Self-Concept: Goal: Ability to disclose and discuss suicidal ideas will improve Outcome: Adequate for Discharge Goal: Will verbalize positive feelings about  self Outcome: Adequate for Discharge   Problem: Education: Goal: Knowledge of General Education information will improve Description Including pain rating scale, medication(s)/side effects and non-pharmacologic comfort measures Outcome: Adequate for Discharge   Problem: Health Behavior/Discharge Planning: Goal: Ability to manage health-related needs will improve Outcome: Adequate for Discharge   Problem: Coping: Goal: Level of anxiety will decrease Outcome: Adequate for Discharge   Problem: Safety: Goal: Ability  to remain free from injury will improve Outcome: Adequate for Discharge

## 2017-12-04 NOTE — Progress Notes (Signed)
D: Pt denies SI/HI/AVH. Pt is pleasant and cooperative. Pt spent majority of the evening in her room A: Pt was offered support and encouragement. Pt was given scheduled medications. Pt was encourage to attend groups. Q 15 minute checks were done for safety.  R: safety maintained on unit.

## 2017-12-04 NOTE — Progress Notes (Signed)
  [facility] Adult Case Management Discharge Plan :  Will you be returning to the same living situation after discharge:  No. Patient is discharging to her neice's home.  At discharge, do you have transportation home?: Yes,  city transport (bus pass) Do you have the ability to pay for your medications: Yes,  [employer] Medicare  Release of information consent forms completed and in the chart;  Patient's signature needed at discharge.  Patient to Follow up at: Follow-up Information    [facility] Follow up on 12/11/2017.   Why:  Hospital follow up appointment is Tuesday, 12/11/2017 at 4:30pm. Please be sure to bring your Photo ID, SSN, any insurance information, and any discharge paperw Contact information: [address] [place], [place], [place] [address]  Phone: [phone] Fax:[phone]          Next level of care provider has access to [place] and Suicide Prevention discussed: Yes,  with patient's neice  Have you used any form of tobacco in the last 30 days? (Cigarettes, Smokeless Tobacco, Cigars, and/or Pipes): Yes  Has patient been referred to the Quitline?: Patient refused referral  Patient has been referred for addiction treatment: N/A  Marylee Floras, LCSWA 12/04/2017, 11:59 AM

## 2017-12-04 NOTE — Progress Notes (Signed)
Adult Psychoeducational Group Note  Date:  12/04/2017 Time:  2:09 AM  Group Topic/Focus:  Wrap-Up Group:   The focus of this group is to help patients review their daily goal of treatment and discuss progress on daily workbooks.  Participation Level:  Active  Participation Quality:  Appropriate  Affect:  Appropriate  Cognitive:  Appropriate  Insight: Appropriate  Engagement in Group:  Engaged  Modes of Intervention:  Discussion  Additional Comments:  Pt stated her goal was to talk with her doctor about her discharge plane. Pt stated she accomplished her goal today and she is going to be discharged tomorrow. Pt rated her over all day a 4 out of 10. Pt stated she felt more safe today and was able to open up more about her feelings in group today with her peers.  Candy Sledge 12/04/2017, 2:09 AM

## 2017-12-04 NOTE — [facility] Suicide Risk Assessment (Signed)
[facility] Discharge Suicide Risk Assessment   Principal Problem: Schizoaffective disorder, bipolar type [facility]) Discharge Diagnoses:  Patient Active Problem List   Diagnosis Date Noted  . MDD (major depressive disorder), recurrent severe, without psychosis ([place]) [F33.2] 11/28/2017  . Multinodular goiter [E04.2] 01/21/2016  . Retroperitoneal mass s/p excision 06/10/2014 [R19.00] 06/10/2014  . Schizoaffective disorder, bipolar type ([place]) [F25.0] 05/05/2014  . GAD (generalized anxiety disorder) [F41.1] 05/05/2014  . Bulimia nervosa [F50.2] 05/05/2014  . Alcohol dependence in remission ([place]) [F10.21] 05/05/2014  . Suicidal ideation [R45.851] 12/25/2012  . Lumbar strain [S39.012A] 12/25/2012  . Levator spasm [M62.838] 11/07/2012  . Internal and external bleeding hemorrhoids [K64.4, K64.8] 05/07/2012  . Anorexia nervosa [F50.00] 09/13/2011    Total Time spent with patient: 30 minutes  Musculoskeletal: Strength & Muscle Tone: within normal limits Gait & Station: normal Patient leans: N/A  Psychiatric Specialty Exam: ROS no headache, no chest pain, no shortness of breath, no vomiting, no diarrhea, no fever or chills  Blood pressure 106/85, pulse 64, temperature 98 F (36.7 C), resp. rate 16, height 5\' 7"  (1.702 m), weight 49.9 kg, last menstrual period 11/21/2017.Body mass index is 17.23 kg/m.  General Appearance: Well Groomed  Engineer, water::  Fair  Speech:  Normal [id number]  Volume:  Normal  Mood:  reports mood is "  a lot better". Denies feeling depressed at this time  Affect:  vaguely restricted   Thought Process:  Linear and Descriptions of Associations: Intact- no thought disorder   Orientation:  Full (Time, Place, and Person)  Thought Content:  no current hallucinations, not internally preoccupied, no delusions expressed   Suicidal Thoughts:  No denies suicidal or self injurious ideations, denies homicidal or violent ideations  Homicidal Thoughts:  No  Memory:  recent and remote grossly  intact   Judgement:  Other:  improving   Insight:  improving  Psychomotor Activity:  Normal  Concentration:  Good  Recall:  Good  Fund of Knowledge:Good  Language: Good  Akathisia:  Negative  Handed:  Right  AIMS (if indicated):    no abnormal or involuntary movements noted or reported .  Assets:  Communication Skills Desire for Improvement Social Support  Sleep:  Number of Hours: 4.75  Cognition: WNL  ADL's:  Intact   Mental Status Per Nursing Assessment::   On Admission:  Suicidal ideation indicated by patient, Self-harm thoughts  Demographic Factors:  36, divorced, has 2 adult children, had recently relocated from [place] to [place], was living with a daughter, but states she cannot return there at this time, on Disability  Loss Factors: Relationship stressors, recent relocation, chronic mental illness , disability  Historical Factors: Has been diagnosed with Schizoaffective Disorder, Bulimia, GAD. History of prior psychiatric admissions, most recently 3 months ago. One suicide attempt as a teenager   Risk Reduction Factors:   Sense of responsibility to family, Positive coping skills or problem solving skills and reports her faith is also an important contributor to her " desire to live"  Continued Clinical Symptoms:  At this time patient is alert, attentive, calm, reports feeling "a lot better than when I came in", and currently denies feeling depressed, affect is restricted, but becomes somewhat  more reactive during session, no thought disorder, no suicidal or self injurious ideations, no homicidal or violent ideations, no current hallucinations, no delusions, not internally preoccupied . Behavior on unit in good control. Denies medication side effects, states she feels her medications are helping and well tolerated . Side effects discussed, including risk  of metabolic, motor disturbances, sedation- advised not to drive if sedated .  Cognitive Features That Contribute To Risk:   No gross cognitive deficits noted upon discharge. Is alert , attentive, and oriented x 3   Suicide Risk:  Mild:  Suicidal ideation of limited frequency, intensity, duration, and specificity.  There are no identifiable plans, no associated intent, mild dysphoria and related symptoms, good self-control (both objective and subjective assessment), few other risk factors, and identifiable protective factors, including available and accessible social support.  Follow-up Information    [facility] Follow up on 12/11/2017.   Why:  Hospital follow up appointment is Tuesday, 12/11/2017 at 4:30pm. Please be sure to bring your Photo ID, SSN, any insurance information, and any discharge paperw Contact information: [address], [place], [place], [place] [address]  Phone: [phone]          Plan Of Care/Follow-up recommendations:  Activity:  as tolerated  Diet:  regular Tests:  NA Other:  See below  Patient is expressing readiness for discharge, wants to discharge today. There are no current grounds for involuntary commitment , and Dr. Cyndee Brightly team notes indicate significant improvement and discharge planning in progress. Patient leaving in good spirits .     Jenne Campus, MD 12/04/2017, 9:32 AM

## 2018-02-06 ENCOUNTER — Emergency Department (HOSPITAL_COMMUNITY): Payer: 59

## 2018-02-06 ENCOUNTER — Emergency Department (HOSPITAL_COMMUNITY)
Admission: EM | Admit: 2018-02-06 | Discharge: 2018-02-06 | Disposition: A | Discharge status: Home / Self Care | Payer: 59 | Attending: Emergency Medicine | Admitting: Emergency Medicine

## 2018-02-06 ENCOUNTER — Other Ambulatory Visit: Payer: Self-pay

## 2018-02-06 ENCOUNTER — Encounter (HOSPITAL_COMMUNITY): Payer: Self-pay | Admitting: Emergency Medicine

## 2018-02-06 DIAGNOSIS — Z79899 Other long term (current) drug therapy: Secondary | ICD-10-CM | POA: Insufficient documentation

## 2018-02-06 DIAGNOSIS — F502 Bulimia nervosa: Secondary | ICD-10-CM | POA: Insufficient documentation

## 2018-02-06 DIAGNOSIS — F17210 Nicotine dependence, cigarettes, uncomplicated: Secondary | ICD-10-CM | POA: Insufficient documentation

## 2018-02-06 DIAGNOSIS — R0789 Other chest pain: Secondary | ICD-10-CM | POA: Diagnosis present

## 2018-02-06 DIAGNOSIS — F331 Major depressive disorder, recurrent, moderate: Secondary | ICD-10-CM | POA: Insufficient documentation

## 2018-02-06 LAB — BASIC METABOLIC PANEL
Anion gap: 7 (ref 5–15)
BUN: 12 mg/dL (ref 6–20)
CO2: 25 mmol/L (ref 22–32)
Calcium: 9.2 mg/dL (ref 8.9–10.3)
Chloride: 103 mmol/L (ref 98–111)
Creatinine, Ser: 0.81 mg/dL (ref 0.44–1.00)
GFR calc Af Amer: >60 mL/min (ref >60)
GFR calc non Af Amer: >60 mL/min (ref >60)
Glucose, Bld: 87 mg/dL (ref 70–99)
Potassium: 3.9 mmol/L (ref 3.5–5.1)
Sodium: 135 mmol/L (ref 135–145)

## 2018-02-06 LAB — CBC WITH DIFFERENTIAL/PLATELET
Abs Immature Granulocytes: 0.03 10*3/uL (ref 0.00–0.07)
Basophils Absolute: 0.1 10*3/uL (ref 0.0–0.1)
Basophils Relative: 1 %
Eosinophils Absolute: 0.1 10*3/uL (ref 0.0–0.5)
Eosinophils Relative: 1 %
HCT: 35.8 % — ABNORMAL LOW (ref 36.0–46.0)
Hemoglobin: 10.2 g/dL — ABNORMAL LOW (ref 12.0–15.0)
Immature Granulocytes: 0 %
Lymphocytes Relative: 12 %
Lymphs Abs: 0.9 10*3/uL (ref 0.7–4.0)
MCH: 24.6 pg — ABNORMAL LOW (ref 26.0–34.0)
MCHC: 28.5 g/dL — ABNORMAL LOW (ref 30.0–36.0)
MCV: 86.5 fL (ref 80.0–100.0)
Monocytes Absolute: 0.6 10*3/uL (ref 0.1–1.0)
Monocytes Relative: 7 %
Neutro Abs: 6.3 10*3/uL (ref 1.7–7.7)
Neutrophils Relative %: 79 %
Platelets: 347 10*3/uL (ref 150–400)
RBC: 4.14 MIL/uL (ref 3.87–5.11)
RDW: 14.7 % (ref 11.5–15.5)
WBC: 7.9 10*3/uL (ref 4.0–10.5)
nRBC: 0 % (ref 0.0–0.2)

## 2018-02-06 LAB — ACETAMINOPHEN LEVEL: Acetaminophen (Tylenol), Serum: <10 ug/mL — ABNORMAL LOW (ref 10–30)

## 2018-02-06 LAB — RAPID URINE DRUG SCREEN, HOSP PERFORMED
Amphetamines: NOT DETECTED
Barbiturates: NOT DETECTED
Benzodiazepines: NOT DETECTED
Cocaine: NOT DETECTED
Opiates: NOT DETECTED
Tetrahydrocannabinol: NOT DETECTED

## 2018-02-06 LAB — SALICYLATE LEVEL: Salicylate Lvl: <7 mg/dL (ref 2.8–30.0)

## 2018-02-06 MED ORDER — ACETAMINOPHEN 325 MG PO TABS
650.0000 mg | ORAL_TABLET | Freq: Once | ORAL | Status: AC
  Administered 2018-02-06: 650 mg via ORAL
  Filled 2018-02-06: qty 2

## 2018-02-06 MED ORDER — HYDROXYZINE HCL 25 MG PO TABS: 25.0000 mg | ORAL_TABLET | Freq: Three times a day (TID) | ORAL | Status: DC | PRN

## 2018-02-06 NOTE — ED Provider Notes (Signed)
Cleared by psychiatry and discharged.    Lennice Sites, DO 02/06/18 2035
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
## 2018-02-06 NOTE — ED Notes (Signed)
Patient given discharge instructions and verbalized understanding.  Patient stable to discharge at this time.  Patient is alert and oriented to baseline.  No distressed noted at this time.  All belongings taken with the patient at discharge.   
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
## 2020-01-12 ENCOUNTER — Encounter (HOSPITAL_COMMUNITY): Payer: Self-pay

## 2020-01-12 ENCOUNTER — Emergency Department (HOSPITAL_COMMUNITY): Payer: 59

## 2020-01-12 ENCOUNTER — Emergency Department (HOSPITAL_COMMUNITY)
Admission: EM | Admit: 2020-01-12 | Discharge: 2020-01-12 | Disposition: A | Discharge status: Home / Self Care | Payer: 59 | Attending: Emergency Medicine | Admitting: Emergency Medicine

## 2020-01-12 DIAGNOSIS — K5900 Constipation, unspecified: Secondary | ICD-10-CM | POA: Diagnosis not present

## 2020-01-12 DIAGNOSIS — F17290 Nicotine dependence, other tobacco product, uncomplicated: Secondary | ICD-10-CM | POA: Insufficient documentation

## 2020-01-12 DIAGNOSIS — R110 Nausea: Secondary | ICD-10-CM | POA: Insufficient documentation

## 2020-01-12 DIAGNOSIS — F17210 Nicotine dependence, cigarettes, uncomplicated: Secondary | ICD-10-CM | POA: Insufficient documentation

## 2020-01-12 DIAGNOSIS — K5909 Other constipation: Secondary | ICD-10-CM | POA: Diagnosis present

## 2020-01-12 DIAGNOSIS — R519 Headache, unspecified: Secondary | ICD-10-CM | POA: Insufficient documentation

## 2020-01-12 DIAGNOSIS — E039 Hypothyroidism, unspecified: Secondary | ICD-10-CM | POA: Diagnosis not present

## 2020-01-12 LAB — COMPREHENSIVE METABOLIC PANEL
ALT: 24 U/L (ref 0–44)
AST: 19 U/L (ref 15–41)
Albumin: 4.2 g/dL (ref 3.5–5.0)
Alkaline Phosphatase: 42 U/L (ref 38–126)
Anion gap: 7 (ref 5–15)
BUN: 21 mg/dL — ABNORMAL HIGH (ref 6–20)
CO2: 27 mmol/L (ref 22–32)
Calcium: 9.1 mg/dL (ref 8.9–10.3)
Chloride: 103 mmol/L (ref 98–111)
Creatinine, Ser: 0.75 mg/dL (ref 0.44–1.00)
GFR, Estimated: >60 mL/min (ref >60)
Glucose, Bld: 89 mg/dL (ref 70–99)
Potassium: 3.6 mmol/L (ref 3.5–5.1)
Sodium: 137 mmol/L (ref 135–145)
Total Bilirubin: 0.7 mg/dL (ref 0.3–1.2)
Total Protein: 7.4 g/dL (ref 6.5–8.1)

## 2020-01-12 LAB — CBC WITH DIFFERENTIAL/PLATELET
Abs Immature Granulocytes: 0.01 10*3/uL (ref 0.00–0.07)
Basophils Absolute: 0.1 10*3/uL (ref 0.0–0.1)
Basophils Relative: 1 %
Eosinophils Absolute: 0 10*3/uL (ref 0.0–0.5)
Eosinophils Relative: 1 %
HCT: 44.7 % (ref 36.0–46.0)
Hemoglobin: 14.8 g/dL (ref 12.0–15.0)
Immature Granulocytes: 0 %
Lymphocytes Relative: 25 %
Lymphs Abs: 1.2 10*3/uL (ref 0.7–4.0)
MCH: 31.7 pg (ref 26.0–34.0)
MCHC: 33.1 g/dL (ref 30.0–36.0)
MCV: 95.7 fL (ref 80.0–100.0)
Monocytes Absolute: 0.4 10*3/uL (ref 0.1–1.0)
Monocytes Relative: 9 %
Neutro Abs: 3.2 10*3/uL (ref 1.7–7.7)
Neutrophils Relative %: 64 %
Platelets: 211 10*3/uL (ref 150–400)
RBC: 4.67 MIL/uL (ref 3.87–5.11)
RDW: 12 % (ref 11.5–15.5)
WBC: 4.9 10*3/uL (ref 4.0–10.5)
nRBC: 0 % (ref 0.0–0.2)

## 2020-01-12 LAB — URINALYSIS, ROUTINE W REFLEX MICROSCOPIC
Bilirubin Urine: NEGATIVE
Glucose, UA: NEGATIVE mg/dL
Hgb urine dipstick: NEGATIVE
Ketones, ur: NEGATIVE mg/dL
Leukocytes,Ua: NEGATIVE
Nitrite: NEGATIVE
Protein, ur: NEGATIVE mg/dL
Specific Gravity, Urine: 1.003 — ABNORMAL LOW (ref 1.005–1.030)
pH: 8 (ref 5.0–8.0)

## 2020-01-12 LAB — LIPASE, BLOOD: Lipase: 42 U/L (ref 11–51)

## 2020-01-12 MED ORDER — IOHEXOL 300 MG/ML  SOLN
100.0000 mL | Freq: Once | INTRAMUSCULAR | Status: AC | PRN
  Administered 2020-01-12: 100 mL via INTRAVENOUS

## 2020-01-12 MED ORDER — ONDANSETRON HCL 4 MG PO TABS: 4.0000 mg | ORAL_TABLET | Freq: Two times a day (BID) | ORAL | 1 refills | Status: AC | PRN

## 2020-01-12 NOTE — ED Provider Notes (Signed)
[place] DEPT Provider Note   CSN: [id number] Arrival date & time: 01/12/20  0750     History Chief Complaint  Patient presents with  . Abdominal Pain  . Nausea    Cassandra Cunningham is a 49 y.o. female with past medical history of IBS, hemorrhoidectomy and anal fistula repaired in May 2021, hysterectomy without oophorectomy in May 2021, who presented to the ED for 3 weeks of nausea, headache, right-sided back pain and white mucus discharge from her anus.  Patient has normal bowel movements but complains of mild rectal pain after bowel movements occasionally.  Patient states that her nausea and right-sided back pain are intermittent but persistent, nothing makes it better or worse, unsure of any triggers.  She also complains of mild epigastric pain.  Patient denies dysuria, urgency or frequency.  She denied blood in her urine, blood in stool or bright red blood per rectum.  She denied fevers, chest pain or shortness of breath.  Patient did not follow-up post op in either of the surgeries.  HPI     Past Medical History:  Diagnosis Date  . Acute appendicitis 04/14/2014  . Alcohol abuse   . Anxiety   . Bipolar disorder ([place])   . Bradycardia    history of slow heart rate  . Depression   . Eating disorder    may be an ongoing issue  . Hemorrhoids    bleeding  . Kidney infection   . Kidney stones   . MDD (major depressive disorder) 12/25/2012  . Schizophrenia [facility])    [place] visit Dr. Volanda Napoleon- 04-28-14 " feeling things and hearing things"- tx. meds has given some improvement- next monthly visit April 2015.  Marland Kitchen Thyroid disease    nodules    Patient Active Problem List   Diagnosis Date Noted  . Constipation 01/12/2020  . Nausea 01/12/2020  . MDD (major depressive disorder), recurrent severe, without psychosis ([place]) 11/28/2017  . Multinodular goiter 01/21/2016  . 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 Schizoaffective disorder, bipolar  type ([place]) 05/05/2014  . GAD (generalized anxiety disorder) 05/05/2014  . Bulimia nervosa 05/05/2014  . Alcohol dependence in remission ([place]) 05/05/2014  . Suicidal ideation 12/25/2012  . Lumbar strain 12/25/2012  . Levator spasm 11/07/2012  . Internal and external bleeding hemorrhoids 05/07/2012  . Anorexia nervosa 09/13/2011    Past Surgical History:  Procedure Laterality Date  . APPENDECTOMY     1'16  . CESAREAN SECTION    . INCISION AND DRAINAGE OF WOUND Left 06/10/2014   Procedure: EXCISION OF LEFT RETROPERITONEAL MASS;  Surgeon: Stark Klein, MD;  Location: [facility] ORS;  Service: General;  Laterality: Left;  . LAPAROSCOPIC APPENDECTOMY N/A 04/14/2014   Procedure: APPENDECTOMY LAPAROSCOPIC;  Surgeon: Stark Klein, MD;  Location: [facility] ORS;  Service: General;  Laterality: N/A;  . TUBAL LIGATION    . TUMOR REMOVAL  04/2014   abdomen  . wisdon teeth       OB History   No obstetric history on file.     Family History  Problem Relation Age of Onset  . Mental illness Mother   . Heart disease Father   . Depression Father   . Alcohol abuse Father   . Eating disorder Father   . Diabetes Sister   . Bipolar disorder Sister   . Thyroid disease Sister   . Diabetes Brother   . Bipolar disorder Brother   . Heart disease Paternal Grandfather   . Suicidality Paternal Uncle   .  Schizophrenia Sister     Social History   Tobacco Use  . Smoking status: Current Every Day Smoker    Packs/day: 0.50    Years: 1.00    Pack years: 0.50    Types: Cigarettes, E-cigarettes    Last attempt to quit: 12/19/2007    Years since quitting: 12.0  . Smokeless tobacco: Never Used  . Tobacco comment: Restarted use 1 year ago but was quit for a long time.   Vaping Use  . Vaping Use: Every day  . Substances: Nicotine, Flavoring  Substance Use Topics  . Alcohol use: No    Comment: Hx of alcohol dependence-now reports that she only drinks etoh occasionally (a few times a year)  . Drug use: No     Comment: Pt denied    Home Medications Prior to Admission medications   Medication Sig Start Date End Date Taking? Authorizing Provider  acetaminophen (TYLENOL) 500 MG tablet Take 500-1,000 mg by mouth every 6 (six) hours as needed for mild pain.   Yes [provider]  senna (SENOKOT) 8.6 MG TABS tablet Take 1 tablet by mouth daily.   Yes [provider]  ondansetron (ZOFRAN) 4 MG tablet Take 1 tablet (4 mg total) by mouth 2 (two) times daily as needed for up to 7 days for nausea or vomiting. 01/12/20 01/19/20  Gaylan Gerold, DO    Allergies    Gluten meal and Ibuprofen  Review of Systems   Review of Systems  Constitutional: Positive for fatigue. Negative for fever.  Respiratory: Negative for shortness of breath.   Cardiovascular: Negative for chest pain and leg swelling.  Gastrointestinal: Positive for abdominal pain, nausea and rectal pain. Negative for anal bleeding, blood in stool, constipation, diarrhea and vomiting.  Genitourinary: Positive for flank pain. Negative for difficulty urinating, dysuria, frequency, hematuria and urgency.    Physical Exam Updated Vital Signs BP 112/76   Pulse (!) 52   Temp 98.4 F (36.9 C) (Oral)   Resp 17   Ht 5' 7.5" (1.715 m)   Wt 54.4 kg   LMP 02/06/2018   SpO2 100%   BMI 18.52 kg/m   Physical Exam Constitutional:      General: She is not in acute distress.    Appearance: She is normal weight.  HENT:     Head: Normocephalic.  Eyes:     General:        Right eye: No discharge.        Left eye: No discharge.  Cardiovascular:     Rate and Rhythm: Normal rate and regular rhythm.     Heart sounds: Normal heart sounds.  Pulmonary:     Effort: No respiratory distress.     Breath sounds: Normal breath sounds. No wheezing.  Abdominal:     General: Bowel sounds are normal.     Palpations: Abdomen is soft. There is no mass.     Tenderness: There is no abdominal tenderness. There is right CVA tenderness. There is no  left CVA tenderness or guarding.  Genitourinary:    Rectum: Normal.     Comments: No blood or white mucus discharge from anus.  No masses palpated with digital exam.  No pain with digital exam. Musculoskeletal:        General: No tenderness.     Cervical back: Normal range of motion.     Right lower leg: No edema.     Left lower leg: No edema.  Skin:    General: Skin  is warm.     Coloration: Skin is not jaundiced.  Neurological:     Mental Status: She is alert.  Psychiatric:        Mood and Affect: Mood normal.     ED Results / Procedures / Treatments   Labs (all labs ordered are listed, but only abnormal results are displayed) Labs Reviewed  COMPREHENSIVE METABOLIC PANEL - Abnormal; Notable for the following components:      Result Value   BUN 21 (*)    All other components within normal limits  URINALYSIS, ROUTINE W REFLEX MICROSCOPIC - Abnormal; Notable for the following components:   Color, Urine STRAW (*)    Specific Gravity, Urine 1.003 (*)    All other components within normal limits  CBC WITH DIFFERENTIAL/PLATELET  LIPASE, BLOOD    EKG None  Radiology CT ABDOMEN PELVIS W CONTRAST  Result Date: 01/12/2020 CLINICAL DATA:  Right flank pain EXAM: CT ABDOMEN AND PELVIS WITH CONTRAST TECHNIQUE: Multidetector CT imaging of the abdomen and pelvis was performed using the standard protocol following bolus administration of intravenous contrast. CONTRAST:  159mL OMNIPAQUE IOHEXOL 300 MG/ML  SOLN COMPARISON:  June 02, 2016 FINDINGS: Lower chest: There is mild atelectatic change in the posteromedial left base. No lung base edema or airspace opacity. Hepatobiliary: There is diffuse hepatic steatosis. No focal liver lesions are appreciable. The gallbladder wall is not appreciably thickened. There is no biliary duct dilatation. Pancreas: There is no pancreatic mass or inflammatory focus. Spleen: No splenic lesions are evident. Adrenals/Urinary Tract: Adrenals bilaterally appear  normal. There are scattered areas of scarring in each kidney. There is no appreciable renal mass or hydronephrosis on either side. There is no appreciable renal or ureteral calculus on either side. Urinary bladder is midline with wall thickness within normal limits. Stomach/Bowel: There is fairly diffuse stool throughout the colon. There is no appreciable bowel wall or mesenteric thickening. There is no appreciable bowel obstruction. The terminal ileum appears unremarkable. There is no evident free air or portal venous air. Vascular/Lymphatic: No abdominal aortic aneurysm. No arterial vascular lesions evident. Major venous structures appear patent. No evident adenopathy in the abdomen or pelvis. Reproductive: The uterus is absent.  No adnexal region mass. Other: Appendix absent. No periappendiceal region inflammatory change. No abscess or ascites is evident in the abdomen or pelvis. Musculoskeletal: No blastic or lytic bone lesions. There is no intramuscular or abdominal wall lesion evident. IMPRESSION: 1. Scarring in each kidney. No renal or ureteral calculus on either side. No hydronephrosis on either side. Urinary bladder wall thickness normal. 2. Diffuse stool throughout colon. Suspect a degree of constipation. No bowel wall thickening or bowel obstruction. No abscess in the abdomen or pelvis. Appendix absent. 3.  Uterus absent. 4.  Hepatic steatosis. Electronically Signed   By: Lowella Grip III M.D.   On: 01/12/2020 10:15    Procedures Procedures (including critical care time)  Medications Ordered in ED Medications  iohexol (OMNIPAQUE) 300 MG/ML solution 100 mL (100 mLs Intravenous Contrast Given 01/12/20 0950)    ED Course  I have reviewed the triage vital signs and the nursing notes.  Pertinent labs & imaging results that were available during my care of the patient were reviewed by me and considered in my medical decision making (see chart for details).  Patient seen and examined.  Her  exam is benign except for mild tenderness to palpation of right CVA.  Digital exam is also unremarkable.  Given her history of fistula repair and  hysterectomy 5 months ago, obtain CT abdomen pelvis to rule out any abscess.  Obtain CBC, CMP, lipase and UA.  CT of the pelvis negative for any abscess, but show some constipation.  Called and consulted general surgery, Margie Billet, PA, recommended outpatient follow-up with [facility] surgery.    MDM Rules/Calculators/A&P                          Patient presents the ED for nausea, right-sided back pain and white mucus discharge from anus for 3 weeks.  She had a fistula repaired at hysterectomy done in May 2021 and she did not follow-up with either surgeries.  Physical exam were benign.  Digital exam did not show any mass or bleeding or mucus discharge.  Lab works are also unremarkable.  CT abdomen pelvis is negative for any abscess and show some constipation which can explain her right-sided back pain.  Consulted general surgery and recommended outpatient follow-up.  Patient is stable for discharge.  P.o. Zofran as needed for nausea.  Advised patient to obtain MiraLAX over-the-counter for constipation and sitz bath for her rectal pain.  Follow-up with her primary care physician on November 2 and obtain referral to surgery and OB/GYN.  Come back to ED for worsening pain.  Patient agrees with the plan.  Final Clinical Impression(s) / ED Diagnoses Final diagnoses:  Constipation, unspecified constipation type  Nausea    Rx / DC Orders ED Discharge Orders         Ordered    ondansetron (ZOFRAN) 4 MG tablet  2 times daily PRN        01/12/20 1055           Gaylan Gerold, DO 01/12/20 [place], [place], DO 01/12/20 1208

## 2020-01-12 NOTE — ED Triage Notes (Signed)
Pt reports having hysterectomy and fistula in [place] about 5 months ago and never followed up due to moving.   C/o nausea, side pain/flank pain, headache, and has notice mucous from rectum.   3/10 pain    A/Ox4 Ambulatory in triage.

## 2020-01-12 NOTE — Discharge Instructions (Addendum)
Cassandra Cunningham, your lab works came back unremarkable.  Your CT scan of the abdomen did not shows any abscess or infectious process.  It however does show some constipation which may explain your right-sided back pain.  Please follow-up with your primary care physician and [facility] surgery for your operations.  You can take Zofran 4 mg as needed for nausea.  You can get your lax over-the-counter for your constipation and sitz bath for hemorrhoids.  Come back to ED for worsening pain.  Take care

## 2020-01-19 ENCOUNTER — Encounter (HOSPITAL_COMMUNITY): Payer: Self-pay | Admitting: Emergency Medicine

## 2020-01-19 ENCOUNTER — Emergency Department (HOSPITAL_COMMUNITY): Payer: 59

## 2020-01-19 ENCOUNTER — Emergency Department (HOSPITAL_COMMUNITY)
Admission: EM | Admit: 2020-01-19 | Discharge: 2020-01-20 | Disposition: A | Discharge status: Home / Self Care | Payer: 59 | Attending: Emergency Medicine | Admitting: Emergency Medicine

## 2020-01-19 ENCOUNTER — Other Ambulatory Visit: Payer: Self-pay

## 2020-01-19 DIAGNOSIS — S0990XA Unspecified injury of head, initial encounter: Secondary | ICD-10-CM | POA: Insufficient documentation

## 2020-01-19 DIAGNOSIS — W500XXA Accidental hit or strike by another person, initial encounter: Secondary | ICD-10-CM | POA: Insufficient documentation

## 2020-01-19 DIAGNOSIS — Z5321 Procedure and treatment not carried out due to patient leaving prior to being seen by health care provider: Secondary | ICD-10-CM | POA: Diagnosis not present

## 2020-01-19 NOTE — ED Notes (Signed)
Pt LWBS 
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
## 2020-01-20 ENCOUNTER — Ambulatory Visit: Payer: 59 | Attending: Internal Medicine | Admitting: Internal Medicine

## 2020-01-20 ENCOUNTER — Other Ambulatory Visit: Payer: Self-pay

## 2020-01-20 ENCOUNTER — Encounter: Payer: Self-pay | Admitting: Internal Medicine

## 2020-01-20 VITALS — BP 113/76 | HR 64 | Resp 16 | Ht 68.0 in | Wt 120.8 lb

## 2020-01-20 DIAGNOSIS — F17290 Nicotine dependence, other tobacco product, uncomplicated: Secondary | ICD-10-CM | POA: Diagnosis not present

## 2020-01-20 DIAGNOSIS — Z7689 Persons encountering health services in other specified circumstances: Secondary | ICD-10-CM

## 2020-01-20 DIAGNOSIS — Z720 Tobacco use: Secondary | ICD-10-CM | POA: Insufficient documentation

## 2020-01-20 DIAGNOSIS — F321 Major depressive disorder, single episode, moderate: Secondary | ICD-10-CM | POA: Insufficient documentation

## 2020-01-20 DIAGNOSIS — F411 Generalized anxiety disorder: Secondary | ICD-10-CM | POA: Diagnosis not present

## 2020-01-20 DIAGNOSIS — F250 Schizoaffective disorder, bipolar type: Secondary | ICD-10-CM

## 2020-01-20 DIAGNOSIS — F502 Bulimia nervosa: Secondary | ICD-10-CM

## 2020-01-20 DIAGNOSIS — Z2821 Immunization not carried out because of patient refusal: Secondary | ICD-10-CM

## 2020-01-20 DIAGNOSIS — F5000 Anorexia nervosa, unspecified: Secondary | ICD-10-CM

## 2020-01-20 NOTE — ED Notes (Signed)
NA for roll call  
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
## 2020-01-22 ENCOUNTER — Telehealth: Payer: Self-pay | Admitting: Licensed Clinical Social Worker

## 2020-01-22 NOTE — Telephone Encounter (Signed)
Call placed to patient regarding IBH referral. LCSW left message requesting a return call.  

## 2020-01-29 ENCOUNTER — Telehealth: Payer: Self-pay | Admitting: Licensed Clinical Social Worker

## 2020-01-29 NOTE — Telephone Encounter (Signed)
Call placed to patient regarding IBH referral. LCSW left message requesting a return call.  

## 2020-01-30 NOTE — Telephone Encounter (Signed)
Pt returned call, asking for a return call please advise  [phone]

## 2020-02-03 ENCOUNTER — Other Ambulatory Visit: Payer: Self-pay

## 2020-02-03 ENCOUNTER — Ambulatory Visit (INDEPENDENT_AMBULATORY_CARE_PROVIDER_SITE_OTHER): Payer: 59 | Admitting: Licensed Clinical Social Worker

## 2020-02-03 DIAGNOSIS — F321 Major depressive disorder, single episode, moderate: Secondary | ICD-10-CM
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
## 2020-02-18 ENCOUNTER — Ambulatory Visit: Payer: 59 | Attending: Internal Medicine | Admitting: Licensed Clinical Social Worker

## 2020-02-18 DIAGNOSIS — F321 Major depressive disorder, single episode, moderate: Secondary | ICD-10-CM

## 2020-02-19 ENCOUNTER — Other Ambulatory Visit: Payer: Self-pay

## 2020-02-20 NOTE — BH Specialist Note (Signed)
Follow up call placed to patient to address psychosocial stressors. Pt reports that she is currently residing with a friend from church, who is allowing patient to stay indefinitely. Pt states that she is interested in obtaining employment to assist with psychosocial stressors and promote feelings of productivity which will improve mood.   Pt agreed to continue utilizing healthy coping skills, such as, praying and spending time with friends from church. Pt is not interested in medication management; however, agreed to schedule follow up appointment with LCSW for 03/03/2020

## 2020-02-20 NOTE — BH Specialist Note (Signed)
See previous note

## 2020-03-03 ENCOUNTER — Ambulatory Visit: Payer: 59 | Attending: Internal Medicine | Admitting: Licensed Clinical Social Worker

## 2020-03-03 ENCOUNTER — Telehealth: Payer: Self-pay | Admitting: Licensed Clinical Social Worker

## 2020-03-03 ENCOUNTER — Other Ambulatory Visit: Payer: Self-pay

## 2020-03-03 DIAGNOSIS — F250 Schizoaffective disorder, bipolar type: Secondary | ICD-10-CM | POA: Diagnosis not present

## 2020-03-03 NOTE — Telephone Encounter (Signed)
Call placed to patient regarding scheduled IBH appointment. LCSW left message requesting a return call.  

## 2020-03-04 NOTE — BH Specialist Note (Signed)
Integrated Behavioral Health via Telemedicine Visit  03/03/2020 Cassandra Cunningham [id number]  Number of [place] visits: 2 Session Start time: 10:45 AM  Session End time: 11:05 AM Total time: 20  Referring Provider: Dr. Wynetta Emery Patient location: Home [facility] Provider location: Office All persons participating in visit: LCSW and Patient Types of Service: Telephone visit  I connected with Cassandra Cunningham by Telephone  (Video is Caregility application) and verified that I am speaking with the correct person using two identifiers.Discussed confidentiality: Yes   I discussed the limitations of telemedicine and the availability of in person appointments.  Discussed there is a possibility of technology failure and discussed alternative modes of communication if that failure occurs.  I discussed that engaging in this telemedicine visit, they consent to the provision of behavioral healthcare and the services will be billed under their insurance.  Patient and/or legal guardian expressed understanding and consented to Telemedicine visit: Yes   Presenting Concerns: Patient and/or family reports the following symptoms/concerns: Pt reports increase in symptoms, stating "false identities are gripping me" Pt elaborated stating the world would consider this to be a symptom of Personality Disorder or Schizophrenia. Pt reports that she continues to resist these identities; however, it is becoming overwhelming and she is considering visiting the hospital to be evaluated for inpatient treatment. Pt has scheduled job interview Duration of problem: Ongoing, increase in symptoms occurred 2 days ago; Severity of problem: severe  Patient and/or Family's Strengths/Protective Factors: Social connections, Social and Emotional competence, Concrete supports in place (healthy food, safe environments, etc.) and Sense of purpose  Goals Addressed: Patient will: 1.  Increase knowledge and/or ability of:  self-management skills Pt agreed to utilize grounding strategies to assist in management of symptoms. She identified prayer and affirmations 2.  Demonstrate ability to: Increase healthy adjustment to current life circumstances and Increase adequate support systems for patient/family Pt agreed to utilize crisis intervention resources to strengthen support.   Progress towards Goals: Ongoing  Interventions: Interventions utilized:  Solution-Focused Strategies, Supportive Counseling and Link to [employer] Standardized Assessments completed: Not Needed  Patient and/or Family Response: Pt was engaged during session and was successful in identifying strategies that will assist in management of symptoms  Assessment: Patient reports difficulty managing mental health condition, noting an increase in symptoms. She denies any active suicidal and/or homicidal ideations. Pt receives strong support from friends, who she currently resides with.   Patient may benefit from initiating medication management and therapy. Pt was successful in identifying warning symptoms of mania and/or crisis in the past. She has agreed to utilize coping strategies and crisis intervention resources.   Plan: 1. Follow up with behavioral health clinician on : 03/17/2020 2. Behavioral recommendations: Utilize strategies discussed and resources provided 3. Referral(s): Integrated Orthoptist (In Clinic) and [place] (LME/Outside Clinic)  I discussed the assessment and treatment plan with the patient and/or parent/guardian. They were provided an opportunity to ask questions and all were answered. They agreed with the plan and demonstrated an understanding of the instructions.   They were advised to call back or seek an in-person evaluation if the symptoms worsen or if the condition fails to improve as anticipated.  Rebekah Chesterfield, LCSW  03/04/2020 10:33 PM

## 2020-03-17 ENCOUNTER — Ambulatory Visit: Payer: 59 | Admitting: Licensed Clinical Social Worker

## 2020-03-17 ENCOUNTER — Telehealth: Payer: Self-pay | Admitting: Licensed Clinical Social Worker

## 2020-03-17 NOTE — Telephone Encounter (Signed)
Call placed to patient regarding scheduled IBH appointment. LCSW left message requesting a return call.  

## 2020-03-26 ENCOUNTER — Ambulatory Visit: Payer: 59 | Admitting: Internal Medicine

## 2020-03-26 ENCOUNTER — Telehealth: Payer: Self-pay | Admitting: Licensed Clinical Social Worker

## 2020-03-26 NOTE — Telephone Encounter (Signed)
Follow up call placed to patient. LCSW left message requesting a return call.  

## 2022-07-30 ENCOUNTER — Emergency Department (HOSPITAL_COMMUNITY): Payer: 59

## 2022-07-30 ENCOUNTER — Other Ambulatory Visit: Payer: Self-pay

## 2022-07-30 ENCOUNTER — Emergency Department (HOSPITAL_COMMUNITY)
Admission: EM | Admit: 2022-07-30 | Discharge: 2022-07-30 | Disposition: A | Discharge status: Home / Self Care | Payer: 59 | Attending: Emergency Medicine | Admitting: Emergency Medicine

## 2022-07-30 ENCOUNTER — Encounter (HOSPITAL_COMMUNITY): Payer: Self-pay | Admitting: Pharmacy Technician

## 2022-07-30 DIAGNOSIS — M7542 Impingement syndrome of left shoulder: Secondary | ICD-10-CM | POA: Diagnosis not present

## 2022-07-30 DIAGNOSIS — M25512 Pain in left shoulder: Secondary | ICD-10-CM | POA: Diagnosis present

## 2022-07-30 DIAGNOSIS — M7582 Other shoulder lesions, left shoulder: Secondary | ICD-10-CM

## 2022-07-30 LAB — COMPREHENSIVE METABOLIC PANEL
ALT: 16 U/L (ref 0–44)
AST: 18 U/L (ref 15–41)
Albumin: 3.9 g/dL (ref 3.5–5.0)
Alkaline Phosphatase: 36 U/L — ABNORMAL LOW (ref 38–126)
Anion gap: 10 (ref 5–15)
BUN: 11 mg/dL (ref 6–20)
CO2: 23 mmol/L (ref 22–32)
Calcium: 8.9 mg/dL (ref 8.9–10.3)
Chloride: 103 mmol/L (ref 98–111)
Creatinine, Ser: 0.79 mg/dL (ref 0.44–1.00)
GFR, Estimated: >60 mL/min (ref >60)
Glucose, Bld: 96 mg/dL (ref 70–99)
Potassium: 3.4 mmol/L — ABNORMAL LOW (ref 3.5–5.1)
Sodium: 136 mmol/L (ref 135–145)
Total Bilirubin: 1 mg/dL (ref 0.3–1.2)
Total Protein: 6.3 g/dL — ABNORMAL LOW (ref 6.5–8.1)

## 2022-07-30 LAB — URINALYSIS, ROUTINE W REFLEX MICROSCOPIC
Bilirubin Urine: NEGATIVE
Glucose, UA: NEGATIVE mg/dL
Hgb urine dipstick: NEGATIVE
Ketones, ur: NEGATIVE mg/dL
Leukocytes,Ua: NEGATIVE
Nitrite: NEGATIVE
Protein, ur: NEGATIVE mg/dL
Specific Gravity, Urine: 1.001 — ABNORMAL LOW (ref 1.005–1.030)
pH: 6 (ref 5.0–8.0)

## 2022-07-30 LAB — LIPASE, BLOOD: Lipase: 41 U/L (ref 11–51)

## 2022-07-30 LAB — CBC
HCT: 45.7 % (ref 36.0–46.0)
Hemoglobin: 15.2 g/dL — ABNORMAL HIGH (ref 12.0–15.0)
MCH: 30.8 pg (ref 26.0–34.0)
MCHC: 33.3 g/dL (ref 30.0–36.0)
MCV: 92.7 fL (ref 80.0–100.0)
Platelets: 264 10*3/uL (ref 150–400)
RBC: 4.93 MIL/uL (ref 3.87–5.11)
RDW: 11.8 % (ref 11.5–15.5)
WBC: 6 10*3/uL (ref 4.0–10.5)
nRBC: 0 % (ref 0.0–0.2)

## 2022-07-30 MED ORDER — METHOCARBAMOL 500 MG PO TABS: 500.0000 mg | ORAL_TABLET | Freq: Four times a day (QID) | ORAL | 0 refills | Status: DC | PRN

## 2022-07-30 NOTE — ED Triage Notes (Signed)
Pt here POV with reports of L shoulder pain onset approx 2 weeks ago. Denies injury. Pain has been worsening over the last few weeks. Pt also with central abdominal pain for the last week. Abdominal pain is intermittent. Denies NVD or fevers.

## 2022-07-30 NOTE — Discharge Instructions (Addendum)
Continue ibuprofen and Tylenol.  A prescription for muscle relaxer was sent to your pharmacy.  Take as needed.  There are some gentle rotator cuff exercises listed in paperwork attached.  Avoid activities/exercises that worsen your pain.  Below is a telephone number to call to schedule an orthopedic surgery follow-up.  Return to emergency department for any new or worsening symptoms of concern.

## 2022-07-30 NOTE — ED Provider Notes (Signed)
[facility] AT [facility] Provider Note   CSN: [id number] Arrival date & time: 07/30/22  1259     History  Chief Complaint  Patient presents with   Abdominal Pain    Upper abdominal pain intermittent x 1 week. Patient states she has a history of kidney reflux, and is concerned about kidney function.   Left shoulder pain worsening over 2 weeks. Denies trauma. Distal CMS intact.     Cassandra Cunningham is a 52 y.o. female.  HPI Patient presents for left shoulder pain.  Medical history includes depression, alcohol abuse, anxiety, nephrolithiasis, schizophrenia, bulimia nervosa, anorexia nervosa.  Patient denies any recent injuries to her left shoulder.  Pain has been worsening over the past 2 weeks.  Pain is worsened with range of motion.  She has been taking ibuprofen with mild relief.  Patient also endorses intermittent central abdominal pain over the past week.  She is concerned of her kidneys and requests testing of kidney function.  She is not currently endorsing abdominal pain.  She denies any nausea.    Home Medications Prior to Admission medications   Medication Sig Start Date End Date Taking? Authorizing Provider  methocarbamol (ROBAXIN) 500 MG tablet Take 1 tablet (500 mg total) by mouth every 6 (six) hours as needed for muscle spasms. 07/30/22  Yes Gloris Manchester, MD  acetaminophen (TYLENOL) 500 MG tablet Take 500-1,000 mg by mouth every 6 (six) hours as needed for mild pain.    [provider]  senna (SENOKOT) 8.6 MG TABS tablet Take 1 tablet by mouth daily.    [provider]      Allergies    Gluten meal and Ibuprofen    Review of Systems   Review of Systems  Gastrointestinal:  Positive for abdominal pain.  Musculoskeletal:  Positive for arthralgias.  All other systems reviewed and are negative.   Physical Exam Updated Vital Signs BP 107/80   Pulse (!) 57   Temp 98.1 F (36.7 C) (Oral)   Resp 17   Ht 5\' 7"  (1.702 m)    Wt 59.9 kg   LMP 02/06/2018   SpO2 100%   BMI 20.67 kg/m  Physical Exam Vitals and nursing note reviewed.  Constitutional:      General: She is not in acute distress.    Appearance: She is well-developed and normal weight. She is not ill-appearing, toxic-appearing or diaphoretic.  HENT:     Head: Normocephalic and atraumatic.     Mouth/Throat:     Mouth: Mucous membranes are moist.  Eyes:     Extraocular Movements: Extraocular movements intact.     Conjunctiva/sclera: Conjunctivae normal.  Cardiovascular:     Rate and Rhythm: Normal rate and regular rhythm.  Pulmonary:     Effort: Pulmonary effort is normal. No respiratory distress.  Abdominal:     Palpations: Abdomen is soft.     Tenderness: There is no abdominal tenderness.  Musculoskeletal:        General: No swelling, tenderness or deformity.     Cervical back: Neck supple.     Comments: Pain in superior aspect of left shoulder in addition to distribution of trapezius muscle.  No tenderness is present.  There are no deformities or swelling.  Left shoulder range of motion is intact.  Pain is worsened with abduction greater than 90 degrees.  Skin:    General: Skin is warm and dry.     Coloration: Skin is not cyanotic or pale.  Neurological:     General: No focal deficit present.     Mental Status: She is alert and oriented to person, place, and time.  Psychiatric:        Mood and Affect: Mood normal.        Behavior: Behavior normal.     ED Results / Procedures / Treatments   Labs (all labs ordered are listed, but only abnormal results are displayed) Labs Reviewed  COMPREHENSIVE METABOLIC PANEL - Abnormal; Notable for the following components:      Result Value   Potassium 3.4 (*)    Total Protein 6.3 (*)    Alkaline Phosphatase 36 (*)    All other components within normal limits  CBC - Abnormal; Notable for the following components:   Hemoglobin 15.2 (*)    All other components within normal limits  URINALYSIS,  ROUTINE W REFLEX MICROSCOPIC - Abnormal; Notable for the following components:   Color, Urine STRAW (*)    Specific Gravity, Urine 1.001 (*)    All other components within normal limits  LIPASE, BLOOD    EKG None  Radiology DG Shoulder Left  Result Date: 07/30/2022 CLINICAL DATA:  Left shoulder pain for several weeks. No known injury. EXAM: LEFT SHOULDER - 2+ VIEW COMPARISON:  None Available. FINDINGS: There is no evidence of fracture or dislocation. There is no evidence of arthropathy or other focal bone abnormality. Soft tissues are unremarkable. IMPRESSION: Negative. Electronically Signed   By: Emmaline Kluver M.D.   On: 07/30/2022 13:55    Procedures Procedures    Medications Ordered in ED Medications - No data to display  ED Course/ Medical Decision Making/ A&P                             Medical Decision Making Amount and/or Complexity of Data Reviewed Labs: ordered. Radiology: ordered.  Risk Prescription drug management.   This patient presents to the ED for concern of left shoulder pain and intermittent abdominal pain, this involves an extensive number of treatment options, and is a complaint that carries with it a high risk of complications and morbidity.  The differential diagnosis includes arthritis, myositis, rotator cuff injury, muscle strain   Co morbidities that complicate the patient evaluation  depression, alcohol abuse, anxiety, nephrolithiasis, schizophrenia, bulimia nervosa, anorexia nervosa   Additional history obtained:  Additional history obtained from N/A External records from outside source obtained and reviewed including EMR   Lab Tests:  I Ordered, and personally interpreted labs.  The pertinent results include: Normal kidney function, normal electrolytes, no leukocytosis with normal lipase, normal hepatobiliary enzymes   Imaging Studies ordered:  I ordered imaging studies including left shoulder x-ray I independently visualized  and interpreted imaging which showed no acute findings I agree with the radiologist interpretation   Cardiac Monitoring: / EKG:  The patient was maintained on a cardiac monitor.  I personally viewed and interpreted the cardiac monitored which showed an underlying rhythm of: Sinus rhythm   Problem List / ED Course / Critical interventions / Medication management  Patient is a healthy 52 year old female presenting for 2 weeks of atraumatic left shoulder pain, worse with range of motion and laying on left side, in addition to some intermittent central abdominal pain.  She is well-appearing on arrival.  Vital signs are reassuring.  She does have intact range of motion to her left shoulder.  She does have worsened pain with abduction beyond 90 degrees  suggestive of rotator cuff tendinitis.  X-ray imaging of her left shoulder did not show any acute findings.  Patient states that she was worried about her kidney function and so lab work was obtained.  Kidney function is normal as are hepatobiliary enzymes, lipase, and WBC.  Patient's abdomen is soft and nontender.  I do not feel that she needs any abdominal imaging given her absence of symptoms and exam findings currently.  Patient declined a muscle relaxer in the ED.  She was prescribed to take at home as needed.  She was advised to continue ibuprofen and Tylenol in addition to gentle rotator cuff exercises.  She was given contact information for orthopedic surgery follow-up.  She was discharged in good condition.    Social Determinants of Health:  Has PCP         Final Clinical Impression(s) / ED Diagnoses Final diagnoses:  Tendinitis of left rotator cuff    Rx / DC Orders ED Discharge Orders          Ordered    methocarbamol (ROBAXIN) 500 MG tablet  Every 6 hours PRN        07/30/22 1539              Gloris Manchester, MD 07/30/22 1545

## 2022-09-18 ENCOUNTER — Ambulatory Visit (HOSPITAL_COMMUNITY)
Admission: EM | Admit: 2022-09-18 | Discharge: 2022-09-18 | Disposition: A | Discharge status: Home / Self Care | Payer: 59 | Attending: Urgent Care | Admitting: Urgent Care

## 2022-09-18 ENCOUNTER — Encounter (HOSPITAL_COMMUNITY): Payer: Self-pay

## 2022-09-18 DIAGNOSIS — N3090 Cystitis, unspecified without hematuria: Secondary | ICD-10-CM | POA: Diagnosis present

## 2022-09-18 DIAGNOSIS — R109 Unspecified abdominal pain: Secondary | ICD-10-CM | POA: Insufficient documentation

## 2022-09-18 LAB — POCT URINALYSIS DIP (MANUAL ENTRY)
Bilirubin, UA: NEGATIVE
Glucose, UA: NEGATIVE mg/dL
Ketones, POC UA: NEGATIVE mg/dL
Nitrite, UA: NEGATIVE
Protein Ur, POC: 30 mg/dL — AB
Spec Grav, UA: 1.01 (ref 1.010–1.025)
Urobilinogen, UA: 0.2 E.U./dL
pH, UA: 6 (ref 5.0–8.0)

## 2022-09-18 MED ORDER — PHENAZOPYRIDINE HCL 100 MG PO TABS: 100.0000 mg | ORAL_TABLET | Freq: Three times a day (TID) | ORAL | 0 refills | Status: AC | PRN

## 2022-09-18 MED ORDER — SULFAMETHOXAZOLE-TRIMETHOPRIM 800-160 MG PO TABS: 1.0000 | ORAL_TABLET | Freq: Two times a day (BID) | ORAL | 0 refills | Status: DC

## 2022-09-18 NOTE — ED Triage Notes (Signed)
Pt is here for RUQ-pain and dysuria that started 2 days ago.

## 2022-09-18 NOTE — Discharge Instructions (Signed)
Your symptoms are consistent with a urinary tract infection.  Start taking the antibiotic twice daily until completed. Avoid excessive sun exposure. Drink plenty of water! Consider cystex or cranberry juice which can also help.  You can take Pyridium up to 3 times daily for maximum of 2 days, this is only to help with the frequency and the pain.  Take on an as-needed basis.  We will send out your urine culture and only call if a change in treatment is necessary.  Monitor for any change in or worsening symptoms; fever, hematuria or flank pain would warrant a recheck

## 2022-09-18 NOTE — ED Provider Notes (Signed)
[facility]    CSN: [id number] Arrival date & time: 09/18/22  0931      History   Chief Complaint Chief Complaint  Patient presents with   Dysuria    HPI Cassandra Cunningham is a 52 y.o. female.   Pleasant 52 year old female presents due to concerns of UTI.  She does have a history of kidney stones and kidney infections in the past.  She also reports a history of urinary reflux.  Over the past 2 days, she started having urinary frequency and dysuria.  She does report some hematuria as well.  The past 24 hours, she reports some right sided flank/abdominal pain.  No nausea or vomiting and no fever.  She denies ever having a kidney stone that stuck in the past, states she is always been able to pass them.  She states the symptoms feels more consistent with a prior history of UTIs however. No OTC meds tried. No pelvic pain, vaginal discharge, itching or rash.   Dysuria   Past Medical History:  Diagnosis Date   Acute appendicitis 04/14/2014   Alcohol abuse    Anxiety    Bipolar disorder (HCC)    Bradycardia    history of slow heart rate   Depression    Eating disorder    may be an ongoing issue   Hemorrhoids    bleeding   Kidney infection    Kidney stones    MDD (major depressive disorder) 12/25/2012   Schizophrenia (HCC)    BH visit Dr. Kenna Gilbert- 04-28-14 " feeling things and hearing things"- tx. meds has given some improvement- next monthly visit April 2015.   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/2014   Schizoaffective disorder, bipolar  type (HCC) 05/05/2014   GAD (generalized anxiety disorder) 05/05/2014   Bulimia nervosa 05/05/2014   Alcohol dependence in remission (HCC) 05/05/2014   Suicidal ideation 12/25/2012   Lumbar strain 12/25/2012   Levator spasm 11/07/2012   Internal and external bleeding hemorrhoids 05/07/2012   Anorexia nervosa 09/13/2011    Past Surgical History:  Procedure Laterality Date   APPENDECTOMY     1'16   CESAREAN SECTION     INCISION AND DRAINAGE OF WOUND Left 06/10/2014   Procedure: EXCISION OF LEFT RETROPERITONEAL MASS;  Surgeon: Almond Lint, MD;  Location: [facility];  Service: General;  Laterality: Left;   LAPAROSCOPIC APPENDECTOMY N/A 04/14/2014   Procedure: APPENDECTOMY LAPAROSCOPIC;  Surgeon: Almond Lint, MD;  Location: [facility];  Service: General;  Laterality: N/A;   TUBAL LIGATION     TUMOR REMOVAL  04/2014   abdomen   wisdon teeth      OB History   No obstetric history on file.      Home Medications    Prior to Admission medications   Medication Sig Start Date End Date Taking? Authorizing Provider  ARIPiprazole (ABILIFY) 5 MG tablet Take 2.5 mg by mouth daily.   Yes [provider]  FLUoxetine (PROZAC) 20 MG tablet Take 20 mg by mouth daily.   Yes [provider]  phenazopyridine (PYRIDIUM) 100 MG tablet Take 1  tablet (100 mg total) by mouth 3 (three) times daily as needed for up to 2 days for pain. 09/18/22 09/20/22 Yes Austine Kelsay L, PA  sulfamethoxazole-trimethoprim (BACTRIM DS) 800-160 MG tablet Take 1 tablet by mouth 2 (two) times daily for 7 days. 09/18/22 09/25/22 Yes Rand Boller L, PA  acetaminophen (TYLENOL) 500 MG tablet Take 500-1,000 mg by mouth every 6 (six) hours as needed for mild pain.    [provider]  methocarbamol (ROBAXIN) 500 MG tablet Take 1 tablet (500 mg total) by mouth every 6 (six) hours as needed for muscle spasms. 07/30/22   Gloris Manchester, MD  senna (SENOKOT) 8.6 MG TABS tablet Take 1 tablet by mouth daily.    [provider]    Family History Family History  Problem Relation Age of Onset   Mental illness Mother    Heart disease Father    Depression Father    Alcohol abuse Father    Eating disorder Father    Diabetes Sister    Bipolar disorder Sister    Thyroid disease Sister    Diabetes Brother    Bipolar disorder Brother    Heart disease Paternal Grandfather    Suicidality Paternal Uncle    Schizophrenia Sister     Social History Social History   Tobacco Use   Smoking status: Every Day    Packs/day: 0.50    Years: 1.00    Additional pack years: 0.00    Total pack years: 0.50    Types: Cigarettes, E-cigarettes    Last attempt to quit: 12/19/2007    Years since quitting: 14.7   Smokeless tobacco: Never   Tobacco comments:    Restarted use 1 year ago but was quit for a long time.   Vaping Use   Vaping Use: Every day   Substances: Nicotine, Flavoring  Substance Use Topics   Alcohol use: No    Comment: Hx of alcohol dependence-now reports that she only drinks etoh occasionally (a few times a year)   Drug use: No    Comment: Pt denied     Allergies   Gluten meal and Ibuprofen   Review of Systems Review of Systems  Genitourinary:  Positive for dysuria.  As per HPI   Physical Exam Triage Vital Signs ED Triage Vitals [09/18/22 1029]  Enc Vitals Group     BP 101/84     Pulse Rate 80     Resp 18     Temp 98.1 F (36.7 C)     Temp Source Oral     SpO2 98 %     Weight      Height      Head Circumference      Peak Flow      Pain Score      Pain Loc      Pain Edu?      Excl. in GC?    No data found.  Updated Vital Signs BP 101/84 (BP Location: Left Arm)   Pulse 80   Temp 98.1 F (36.7 C) (Oral)   Resp 18   LMP 02/06/2018   SpO2 98%   Visual Acuity Right Eye Distance:   Left Eye Distance:   Bilateral Distance:    Right Eye Near:   Left Eye Near:    Bilateral Near:     Physical Exam Vitals and nursing note reviewed.  Constitutional:       General: She is not in acute distress.    Appearance: Normal  appearance. She is normal weight. She is not ill-appearing, toxic-appearing or diaphoretic.  HENT:     Head: Normocephalic and atraumatic.  Eyes:     Pupils: Pupils are equal, round, and reactive to light.  Cardiovascular:     Rate and Rhythm: Normal rate.     Pulses: Normal pulses.     Heart sounds: Normal heart sounds. No murmur heard.    No friction rub. No gallop.  Pulmonary:     Effort: Pulmonary effort is normal. No respiratory distress.     Breath sounds: Normal breath sounds. No wheezing or rales.  Chest:     Chest wall: No tenderness.  Abdominal:     General: Abdomen is flat. Bowel sounds are normal. There is no distension.     Palpations: Abdomen is soft. There is no mass.     Tenderness: There is no abdominal tenderness. There is no right CVA tenderness, left CVA tenderness, guarding or rebound.     Hernia: No hernia is present.  Musculoskeletal:     Right lower leg: No edema.     Left lower leg: No edema.  Skin:    General: Skin is warm.     Findings: No bruising, erythema or rash.  Neurological:     General: No focal deficit present.     Mental Status: She is alert. Mental status is at baseline.  Psychiatric:        Mood and Affect: Mood normal.      UC Treatments / Results  Labs (all labs ordered are listed, but only abnormal results are displayed) Labs Reviewed  POCT URINALYSIS DIP (MANUAL ENTRY) - Abnormal; Notable for the following components:      Result Value   Blood, UA moderate (*)    Protein Ur, POC =30 (*)    Leukocytes, UA Large (3+) (*)    All other components within normal limits  URINE CULTURE    EKG   Radiology No results found.  Procedures Procedures (including critical care time)  Medications Ordered in UC Medications - No data to display  Initial Impression / Assessment and Plan / UC Course  I have reviewed the triage vital signs and the nursing notes.  Pertinent  labs & imaging results that were available during my care of the patient were reviewed by me and considered in my medical decision making (see chart for details).     R flank pain - vital signs stable. Unable to reproduce CVA tenderness. Do not suspect pyelonephritis at present time, however given pt's hx of the same, will start bactrim to cover for possible early developing pyelo. Cystitis - pyridium and bactrim as above. Urine culture obtained.   Final Clinical Impressions(s) / UC Diagnoses   Final diagnoses:  Acute right flank pain  Cystitis     Discharge Instructions      Your symptoms are consistent with a urinary tract infection.  Start taking the antibiotic twice daily until completed. Avoid excessive sun exposure. Drink plenty of water! Consider cystex or cranberry juice which can also help.  You can take Pyridium up to 3 times daily for maximum of 2 days, this is only to help with the frequency and the pain.  Take on an as-needed basis.  We will send out your urine culture and only call if a change in treatment is necessary.  Monitor for any change in or worsening symptoms; fever, hematuria or flank pain would warrant a recheck      ED Prescriptions  Medication Sig Dispense Auth. Provider   sulfamethoxazole-trimethoprim (BACTRIM DS) 800-160 MG tablet Take 1 tablet by mouth 2 (two) times daily for 7 days. 14 tablet Rudolf Blizard L, PA   phenazopyridine (PYRIDIUM) 100 MG tablet Take 1 tablet (100 mg total) by mouth 3 (three) times daily as needed for up to 2 days for pain. 6 tablet Keaundre Thelin L, [place]      PDMP not reviewed this encounter.   Maretta Bees, [place] 09/18/22 1138

## 2022-09-20 ENCOUNTER — Telehealth (HOSPITAL_COMMUNITY): Payer: Self-pay | Admitting: Emergency Medicine

## 2022-09-20 LAB — URINE CULTURE: Culture: 60000 — AB

## 2022-09-20 MED ORDER — NITROFURANTOIN MONOHYD MACRO 100 MG PO CAPS: 100.0000 mg | ORAL_CAPSULE | Freq: Two times a day (BID) | ORAL | 0 refills | Status: DC

## 2022-09-22 ENCOUNTER — Telehealth (HOSPITAL_COMMUNITY): Payer: Self-pay | Admitting: Emergency Medicine

## 2022-09-22 MED ORDER — LEVOFLOXACIN 750 MG PO TABS: 750.0000 mg | ORAL_TABLET | Freq: Every day | ORAL | 0 refills | Status: AC

## 2022-09-22 NOTE — Telephone Encounter (Signed)
Patient called and states she feels she is not improving on new antibiotics.  Reviewed with Alphonzo Lemmings, APP who states to evaluate patient for fever, blood in the urine, severe worsening of symptoms.  Patient denies.  Per Alphonzo Lemmings, APP switch patient to Levaquin, per Pyelo protocol and review strict return/ED precautions.  PAtient gave me permission to leave her a voicemail for our final communication, reviewed plan, return precautions (blood in urine, fever, worsening instead of improvement of symptoms, n/v) and encouraged return call with any concerns or questions.

## 2022-09-29 ENCOUNTER — Encounter: Payer: Self-pay | Admitting: Nurse Practitioner

## 2022-09-29 ENCOUNTER — Ambulatory Visit (INDEPENDENT_AMBULATORY_CARE_PROVIDER_SITE_OTHER): Payer: 59 | Admitting: Nurse Practitioner

## 2022-09-29 VITALS — BP 122/70 | HR 63 | Ht 67.75 in | Wt 121.2 lb

## 2022-09-29 DIAGNOSIS — F250 Schizoaffective disorder, bipolar type: Secondary | ICD-10-CM

## 2022-09-29 DIAGNOSIS — E559 Vitamin D deficiency, unspecified: Secondary | ICD-10-CM

## 2022-09-29 DIAGNOSIS — R1900 Intra-abdominal and pelvic swelling, mass and lump, unspecified site: Secondary | ICD-10-CM | POA: Diagnosis not present

## 2022-09-29 DIAGNOSIS — N390 Urinary tract infection, site not specified: Secondary | ICD-10-CM

## 2022-09-29 DIAGNOSIS — L989 Disorder of the skin and subcutaneous tissue, unspecified: Secondary | ICD-10-CM

## 2022-09-29 DIAGNOSIS — N952 Postmenopausal atrophic vaginitis: Secondary | ICD-10-CM | POA: Diagnosis not present

## 2022-09-29 DIAGNOSIS — B351 Tinea unguium: Secondary | ICD-10-CM

## 2022-09-29 DIAGNOSIS — S39012A Strain of muscle, fascia and tendon of lower back, initial encounter: Secondary | ICD-10-CM

## 2022-09-29 MED ORDER — METHOCARBAMOL 500 MG PO TABS
500.0000 mg | ORAL_TABLET | Freq: Four times a day (QID) | ORAL | 3 refills | Status: AC | PRN
Start: 2022-09-29 — End: ?

## 2022-09-29 MED ORDER — FLUOXETINE HCL 20 MG PO CAPS: 20.0000 mg | ORAL_CAPSULE | Freq: Every day | ORAL | 1 refills | Status: AC

## 2022-09-29 MED ORDER — ESTRADIOL 0.1 MG/GM VA CREA
TOPICAL_CREAM | VAGINAL | 6 refills | Status: DC
Start: 2022-09-29 — End: 2023-04-16

## 2022-09-29 MED ORDER — ARIPIPRAZOLE 5 MG PO TABS
5.0000 mg | ORAL_TABLET | Freq: Every day | ORAL | 1 refills | Status: AC
Start: 2022-09-29 — End: ?

## 2022-09-29 NOTE — Patient Instructions (Addendum)
I would soak your foot in warm water with 1/3 cup of white vinegar daily and then apply the mixture you have been using and add tea tree oil to this. If you don't feel this is making a difference then please let me know.   Let me know if dermatology is goingto be several months out and we will do a punch biopsy on the area on your arm here in the office.   I will do some research on the treatment for PTSD nightmares and if I can safely write for something I will send that in for you.
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
## 2022-10-03 ENCOUNTER — Telehealth: Payer: Self-pay | Admitting: Nurse Practitioner

## 2022-10-03 LAB — CBC WITH DIFFERENTIAL/PLATELET
Basophils Absolute: 0.1 10*3/uL (ref 0.0–0.2)
Basos: 1 %
EOS (ABSOLUTE): 0 10*3/uL (ref 0.0–0.4)
Eos: 0 %
Hematocrit: 42 % (ref 34.0–46.6)
Hemoglobin: 14.4 g/dL (ref 11.1–15.9)
Immature Grans (Abs): 0 10*3/uL (ref 0.0–0.1)
Immature Granulocytes: 1 %
Lymphocytes Absolute: 1.2 10*3/uL (ref 0.7–3.1)
Lymphs: 20 %
MCH: 31.9 pg (ref 26.6–33.0)
MCHC: 34.3 g/dL (ref 31.5–35.7)
MCV: 93 fL (ref 79–97)
Monocytes Absolute: 0.4 10*3/uL (ref 0.1–0.9)
Monocytes: 7 %
Neutrophils Absolute: 4.3 10*3/uL (ref 1.4–7.0)
Neutrophils: 71 %
Platelets: 302 10*3/uL (ref 150–450)
RBC: 4.52 x10E6/uL (ref 3.77–5.28)
RDW: 12 % (ref 11.7–15.4)
WBC: 6.1 10*3/uL (ref 3.4–10.8)

## 2022-10-03 LAB — TESTOSTERONE, FREE, TOTAL, SHBG
Sex Hormone Binding: 188 nmol/L — ABNORMAL HIGH (ref 17.3–125.0)
Testosterone, Free: 0.2 pg/mL (ref 0.0–4.2)
Testosterone: 6 ng/dL (ref 4–50)

## 2022-10-03 LAB — VITAMIN D 25 HYDROXY (VIT D DEFICIENCY, FRACTURES): Vit D, 25-Hydroxy: 35.2 ng/mL (ref 30.0–100.0)

## 2022-10-03 LAB — COMPREHENSIVE METABOLIC PANEL
ALT: 19 IU/L (ref 0–32)
AST: 22 IU/L (ref 0–40)
Albumin: 3.6 g/dL — ABNORMAL LOW (ref 3.9–4.9)
Alkaline Phosphatase: 42 IU/L — ABNORMAL LOW (ref 44–121)
BUN/Creatinine Ratio: 13 (ref 9–23)
BUN: 10 mg/dL (ref 6–24)
Bilirubin Total: 0.4 mg/dL (ref 0.0–1.2)
CO2: 25 mmol/L (ref 20–29)
Calcium: 9.1 mg/dL (ref 8.7–10.2)
Chloride: 103 mmol/L (ref 96–106)
Creatinine, Ser: 0.8 mg/dL (ref 0.57–1.00)
Globulin, Total: 1.9 g/dL (ref 1.5–4.5)
Glucose: 88 mg/dL (ref 70–99)
Potassium: 4.7 mmol/L (ref 3.5–5.2)
Sodium: 140 mmol/L (ref 134–144)
Total Protein: 5.5 g/dL — ABNORMAL LOW (ref 6.0–8.5)
eGFR: 90 mL/min/{1.73_m2} (ref >59)

## 2022-10-03 LAB — ESTROGENS, TOTAL: Estrogen: 414 pg/mL

## 2022-10-03 LAB — PROGESTERONE: Progesterone: 1.7 ng/mL

## 2022-10-03 LAB — LIPID PANEL
Chol/HDL Ratio: 1.9 ratio (ref 0.0–4.4)
Cholesterol, Total: 184 mg/dL (ref 100–199)
HDL: 95 mg/dL (ref >39)
LDL Chol Calc (NIH): 77 mg/dL (ref 0–99)
Triglycerides: 61 mg/dL (ref 0–149)
VLDL Cholesterol Cal: 12 mg/dL (ref 5–40)

## 2022-10-03 NOTE — Telephone Encounter (Signed)
Hey the referral that was placed for [facility] to [facility] called and stated they don't deal with nephrology there and she would need to be referred somewhere else.

## 2022-10-18 DIAGNOSIS — B351 Tinea unguium: Secondary | ICD-10-CM | POA: Insufficient documentation

## 2022-10-18 DIAGNOSIS — L989 Disorder of the skin and subcutaneous tissue, unspecified: Secondary | ICD-10-CM | POA: Insufficient documentation

## 2022-10-18 DIAGNOSIS — N390 Urinary tract infection, site not specified: Secondary | ICD-10-CM | POA: Insufficient documentation

## 2022-10-18 NOTE — Assessment & Plan Note (Signed)
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 Given the changes noted, I recommend evaluation with dermatology to help determine the nature of the lesion present.  Plan: - Referral placed today

## 2022-10-18 NOTE — Assessment & Plan Note (Signed)
Reported arthritis and muscle spasms of the neck and back of chronic nature. She has been utilizing robaxin PRN to help with mobility and spasms. No saddle symptoms present today.  Plan: - Refill provided on robaxin - Recommend gentle stretching exercises at least once a day to help with movement and mobility.  - Consider formal PT if symptoms worsen

## 2022-10-18 NOTE — Assessment & Plan Note (Signed)
Cassandra Cunningham has a history of chronic uti with reported reflux into kidneys causing increased risks of kidney infection. No symptoms present at this time to warrant immediate evaluation.  Plan: - Referral to nephrology for further management

## 2022-10-18 NOTE — Assessment & Plan Note (Signed)
Management with natural treatment options appear to be effective. The infection appears to be growing out, which is reassuring. To reduce the risks of recurrence while the toenail continues to grow, OK to continue at home treatments. Consider warm salt water and white vinegar soaks intermittently to help with infection.

## 2022-10-18 NOTE — Assessment & Plan Note (Signed)
Chronic, historical diagnosis. Currently management with abilify and prozac. Additionally she has PTSD with nightmares that are not well controlled at this time. She has no alarm symptoms present and her mood appears stable.  Plan: - Refills provided on medications - Referral for psychiatry placed today for management and evaluation of mental health concerns - If you should have acute concerns prior to seeing psychiatry, please contact the office or consider visiting the [facility] for immediate attention.

## 2022-10-20 ENCOUNTER — Telehealth: Payer: Self-pay | Admitting: Nurse Practitioner

## 2022-10-20 NOTE — Telephone Encounter (Signed)
Deetta called about lab results from Cassandra Cunningham yesterday. She is saying she doesn't want to decrease her estrogen intake and is asking if there is a supplement she can take that helps testosterone and estrogen, she says there is a birth control that she thinks can help with both if you think this would be ok for her to do?

## 2022-10-24 ENCOUNTER — Ambulatory Visit (INDEPENDENT_AMBULATORY_CARE_PROVIDER_SITE_OTHER): Payer: 59

## 2022-10-24 DIAGNOSIS — Z0000 Encounter for general adult medical examination without abnormal findings: Secondary | ICD-10-CM | POA: Diagnosis not present
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
## 2022-10-24 NOTE — Patient Instructions (Signed)
Cassandra Cunningham , Thank you for taking time to come for your Medicare Wellness Visit. I appreciate your ongoing commitment to your health goals. Please review the following plan we discussed and let me know if I can assist you in the future.   Referrals/Orders/Follow-Ups/Clinician Recommendations: care guide referral  This is a list of the screening recommended for you and due dates:  Health Maintenance  Topic Date Due   HIV Screening  Never done   Pap Smear  08/15/2009   Colon Cancer Screening  Never done   COVID-19 Vaccine (3 - 2023-24 season) 11/18/2021   Mammogram  06/02/2022   Zoster (Shingles) Vaccine (1 of 2) Never done   Flu Shot  10/19/2022   Medicare Annual Wellness Visit  10/24/2023   DTaP/Tdap/Td vaccine (2 - Td or Tdap) 11/04/2024   Hepatitis C Screening  Completed   HPV Vaccine  Aged Out    Advanced directives: (ACP Link)Information on Advanced Care Planning can be found at [facility] Advance Health Care Directives Advance Health Care Directives ([email or web address]   Next Medicare Annual Wellness Visit scheduled for next year: No, not sure if going to be moving away  Preventive Care 40-64 Years, Female Preventive care refers to lifestyle choices and visits with your health care provider that can promote health and wellness. What does preventive care include? A yearly physical exam. This is also called an annual well check. Dental exams once or twice a year. Routine eye exams. Ask your health care provider how often you should have your eyes checked. Personal lifestyle choices, including: Daily care of your teeth and gums. Regular physical activity. Eating a healthy diet. Avoiding tobacco and drug use. Limiting alcohol use. Practicing safe sex. Taking low-dose aspirin daily starting at age 3. Taking vitamin and mineral supplements as recommended by your health care provider. What happens during an annual well check? The services and screenings done by  your health care provider during your annual well check will depend on your age, overall health, lifestyle risk factors, and family history of disease. Counseling  Your health care provider may ask you questions about your: Alcohol use. Tobacco use. Drug use. Emotional well-being. Home and relationship well-being. Sexual activity. Eating habits. Work and work Astronomer. Method of birth control. Menstrual cycle. Pregnancy history. Screening  You may have the following tests or measurements: Height, weight, and BMI. Blood pressure. Lipid and cholesterol levels. These may be checked every 5 years, or more frequently if you are over 51 years old. Skin check. Lung cancer screening. You may have this screening every year starting at age 47 if you have a 30-pack-year history of smoking and currently smoke or have quit within the past 15 years. Fecal occult blood test (FOBT) of the stool. You may have this test every year starting at age 6. Flexible sigmoidoscopy or colonoscopy. You may have a sigmoidoscopy every 5 years or a colonoscopy every 10 years starting at age 22. Hepatitis C blood test. Hepatitis B blood test. Sexually transmitted disease (STD) testing. Diabetes screening. This is done by checking your blood sugar (glucose) after you have not eaten for a while (fasting). You may have this done every 1-3 years. Mammogram. This may be done every 1-2 years. Talk to your health care provider about when you should start having regular mammograms. This may depend on whether you have a family history of breast cancer. BRCA-related cancer screening. This may be done if you have a family history of breast, ovarian,  tubal, or peritoneal cancers. Pelvic exam and Pap test. This may be done every 3 years starting at age 40. Starting at age 7, this may be done every 5 years if you have a Pap test in combination with an HPV test. Bone density scan. This is done to screen for osteoporosis. You may  have this scan if you are at high risk for osteoporosis. Discuss your test results, treatment options, and if necessary, the need for more tests with your health care provider. Vaccines  Your health care provider may recommend certain vaccines, such as: Influenza vaccine. This is recommended every year. Tetanus, diphtheria, and acellular pertussis (Tdap, Td) vaccine. You may need a Td booster every 10 years. Zoster vaccine. You may need this after age 74. Pneumococcal 13-valent conjugate (PCV13) vaccine. You may need this if you have certain conditions and were not previously vaccinated. Pneumococcal polysaccharide (PPSV23) vaccine. You may need one or two doses if you smoke cigarettes or if you have certain conditions. Talk to your health care provider about which screenings and vaccines you need and how often you need them. This information is not intended to replace advice given to you by your health care provider. Make sure you discuss any questions you have with your health care provider. Document Released: 04/02/2015 Document Revised: 11/24/2015 Document Reviewed: 01/05/2015 Elsevier Interactive Patient Education  2017 [employer].    Fall Prevention in the Home Falls can cause injuries. They can happen to people of all ages. There are many things you can do to make your home safe and to help prevent falls. What can I do on the outside of my home? Regularly fix the edges of walkways and driveways and fix any cracks. Remove anything that might make you trip as you walk through a door, such as a raised step or threshold. Trim any bushes or trees on the path to your home. Use bright outdoor lighting. Clear any walking paths of anything that might make someone trip, such as rocks or tools. Regularly check to see if handrails are loose or broken. Make sure that both sides of any steps have handrails. Any raised decks and porches should have guardrails on the edges. Have any leaves, snow,  or ice cleared regularly. Use sand or salt on walking paths during winter. Clean up any spills in your garage right away. This includes oil or grease spills. What can I do in the bathroom? Use night lights. Install grab bars by the toilet and in the tub and shower. Do not use towel bars as grab bars. Use non-skid mats or decals in the tub or shower. If you need to sit down in the shower, use a plastic, non-slip stool. Keep the floor dry. Clean up any water that spills on the floor as soon as it happens. Remove soap buildup in the tub or shower regularly. Attach bath mats securely with double-sided non-slip rug tape. Do not have throw rugs and other things on the floor that can make you trip. What can I do in the bedroom? Use night lights. Make sure that you have a light by your bed that is easy to reach. Do not use any sheets or blankets that are too big for your bed. They should not hang down onto the floor. Have a firm chair that has side arms. You can use this for support while you get dressed. Do not have throw rugs and other things on the floor that can make you trip. What can I  do in the kitchen? Clean up any spills right away. Avoid walking on wet floors. Keep items that you use a lot in easy-to-reach places. If you need to reach something above you, use a strong step stool that has a grab bar. Keep electrical cords out of the way. Do not use floor polish or wax that makes floors slippery. If you must use wax, use non-skid floor wax. Do not have throw rugs and other things on the floor that can make you trip. What can I do with my stairs? Do not leave any items on the stairs. Make sure that there are handrails on both sides of the stairs and use them. Fix handrails that are broken or loose. Make sure that handrails are as long as the stairways. Check any carpeting to make sure that it is firmly attached to the stairs. Fix any carpet that is loose or worn. Avoid having throw rugs  at the top or bottom of the stairs. If you do have throw rugs, attach them to the floor with carpet tape. Make sure that you have a light switch at the top of the stairs and the bottom of the stairs. If you do not have them, ask someone to add them for you. What else can I do to help prevent falls? Wear shoes that: Do not have high heels. Have rubber bottoms. Are comfortable and fit you well. Are closed at the toe. Do not wear sandals. If you use a stepladder: Make sure that it is fully opened. Do not climb a closed stepladder. Make sure that both sides of the stepladder are locked into place. Ask someone to hold it for you, if possible. Clearly mark and make sure that you can see: Any grab bars or handrails. First and last steps. Where the edge of each step is. Use tools that help you move around (mobility aids) if they are needed. These include: Canes. Walkers. Scooters. Crutches. Turn on the lights when you go into a dark area. Replace any light bulbs as soon as they burn out. Set up your furniture so you have a clear path. Avoid moving your furniture around. If any of your floors are uneven, fix them. If there are any pets around you, be aware of where they are. Review your medicines with your doctor. Some medicines can make you feel dizzy. This can increase your chance of falling. Ask your doctor what other things that you can do to help prevent falls. This information is not intended to replace advice given to you by your health care provider. Make sure you discuss any questions you have with your health care provider. Document Released: 12/31/2008 Document Revised: 08/12/2015 Document Reviewed: 04/10/2014 Elsevier Interactive Patient Education  2017 [employer].

## 2022-10-25 NOTE — Telephone Encounter (Signed)
Please let Cassandra Cunningham know that we cannot use birth control in someone who has undergone a hysterectomy.   We could consider stopping the estrogen cream and changing to a tablet or a vaginal ring (depending on insurance coverage).   If she is not having any symptoms and is not having problems with the current form of medication, I do not feel that changing the form of the medication would be of benefit at this time.   I do not know of any supplement that she could take for testosterone and estrogen. Her estrogen levels should not be increased as they are on the high end of normal and increasing the levels would increase risks.   If she would like, we can send a referral to GYN for management. They are the experts on hormone replacement and the best methods. OK to send referral if she would like for HRT.

## 2022-10-30 ENCOUNTER — Telehealth: Payer: Self-pay | Admitting: Nurse Practitioner

## 2022-10-30 ENCOUNTER — Other Ambulatory Visit: Payer: 59

## 2022-10-30 NOTE — Telephone Encounter (Signed)
Received call from [facility] for mental health Pt is scheduled to see Merian Capron on 10/1  at 11:00

## 2022-10-30 NOTE — Patient Outreach (Signed)
Medicaid Managed Care Social Work Note  10/30/2022 Name:  Cassandra Cunningham MRN:  [id number] DOB:  [birth year]  Cassandra Cunningham is an 52 y.o. year old female who is a primary patient of Early, Sung Amabile, NP.  The [facility] Managed Care Coordination team was consulted for assistance with:   housing resources  Cassandra Cunningham was given information about Medicaid Managed Care Coordination team services today. Cassandra Cunningham Patient agreed to services and verbal consent obtained.  Engaged with patient  for by telephone forinitial visit in response to referral for case management and/or care coordination services.   Assessments/Interventions:  Review of past medical history, allergies, medications, health status, including review of consultants reports, laboratory and other test data, was performed as part of comprehensive evaluation and provision of chronic care management services.  SDOH: (Social Determinant of Health) assessments and interventions performed: SDOH Interventions    Flowsheet Row Clinical Support from 10/24/2022 in [facility]  SDOH Interventions   Food Insecurity Interventions Intervention Not Indicated  Housing Interventions AMB Referral  Transportation Interventions Intervention Not Indicated  Utilities Interventions Intervention Not Indicated  Alcohol Usage Interventions Intervention Not Indicated (Score <7)  Depression Interventions/Treatment  Currently on Treatment  Financial Strain Interventions Other (Comment)  [care guide referral]  Physical Activity Interventions Intervention Not Indicated  Stress Interventions Other (Comment)  [medication helps]  Social Connections Interventions Intervention Not Indicated  Health Literacy Interventions Intervention Not Indicated      BSW completed a telephone outreach with patient, she states she has to be out of her home by the end of the month and she has no where to go. Patient states the person she was living with passed away in  06-Sep-2022, and the owners of the home want to do something with it, she is not being evicted. Patient states she has applied to some places but the have a waitlist. BSW will email patient a list of housing resources to [email or web address]. Advanced Directives Status:  Not addressed in this encounter.  Care Plan                 Allergies  Allergen Reactions   Gluten Meal Other (See Comments)    Due to irritable bowel syndrome   Ibuprofen Other (See Comments)    Per pt her feels like its freezing up on her    Medications Reviewed Today   Medications were not reviewed in this encounter     Patient Active Problem List   Diagnosis Date Noted   Skin lesion of left arm 10/18/2022   Chronic UTI 10/18/2022   Fungal infection of toenail 10/18/2022   Vapes nicotine containing substance 01/20/2020   Influenza vaccination declined 01/20/2020   MDD (major depressive disorder), single episode, moderate (HCC) 01/20/2020   Constipation 01/12/2020   Nausea 01/12/2020   MDD (major depressive disorder), recurrent severe, without psychosis (HCC) 11/28/2017   Multinodular goiter 01/21/2016   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   Follow up:  Patient agrees to  Care Plan and Follow-up.  Plan: The Managed Medicaid care management team will reach out to the patient again over the next 7 days.  Date/time of next scheduled Social Work care management/care coordination outreach:    8 /21/24  Gus Puma, Kenard Gower, [facility]  [facility] Social Worker [phone]

## 2022-10-30 NOTE — Patient Instructions (Signed)
 Visit Information  The Patient                                              was given information about Medicaid Managed Care team care coordination services and consented to engagement with the [facility] Managed Care team.   Social Worker will follow up on 11/08/22.   Cassandra Cunningham, MHA [facility]  Managed [facility] Social Worker [phone]

## 2022-11-02 ENCOUNTER — Telehealth: Payer: Self-pay | Admitting: Nurse Practitioner

## 2022-11-02 ENCOUNTER — Other Ambulatory Visit: Payer: Self-pay

## 2022-11-02 DIAGNOSIS — R1900 Intra-abdominal and pelvic swelling, mass and lump, unspecified site: Secondary | ICD-10-CM

## 2022-11-02 NOTE — Telephone Encounter (Signed)
Pt called she had an "episode" last night she woke up during the night to her body "convulsing" Only last a few seconds and NOS   she wanted to let you know and see if this is something that she needs to be concerned about or anything that she should watch for

## 2022-11-08 ENCOUNTER — Other Ambulatory Visit: Payer: 59

## 2022-11-08 NOTE — Patient Outreach (Signed)
  Medicaid Managed Care   Unsuccessful Outreach Note  11/08/2022 Name: Cassandra Cunningham MRN: [id number] DOB: 03-Jun-1970  Referred by: Tollie Eth, NP Reason for referral : High Risk Managed Medicaid (MM Social work unsuccessful telephone outreach )   An unsuccessful telephone outreach was attempted today. The patient was referred to the case management team for assistance with care management and care coordination.   Follow Up Plan: A HIPAA compliant phone message was left for the patient providing contact information and requesting a return call.   Abelino Derrick, MHA [facility]  Managed [facility] Social Worker [phone]

## 2022-11-08 NOTE — Patient Instructions (Signed)
  Medicaid Managed Care   Unsuccessful Outreach Note  11/08/2022 Name: Cassandra Cunningham MRN: [id number] DOB: 03-Jun-1970  Referred by: Tollie Eth, NP Reason for referral : High Risk Managed Medicaid (MM Social work unsuccessful telephone outreach )   An unsuccessful telephone outreach was attempted today. The patient was referred to the case management team for assistance with care management and care coordination.   Follow Up Plan: A HIPAA compliant phone message was left for the patient providing contact information and requesting a return call.   Abelino Derrick, MHA [facility]  Managed [facility] Social Worker [phone]

## 2022-11-13 ENCOUNTER — Other Ambulatory Visit: Payer: Self-pay

## 2022-11-13 DIAGNOSIS — Z79890 Hormone replacement therapy (postmenopausal): Secondary | ICD-10-CM

## 2022-11-22 ENCOUNTER — Telehealth: Payer: Self-pay

## 2022-11-22 NOTE — Telephone Encounter (Signed)
..   Medicaid Managed Care   Unsuccessful Outreach Note  11/22/2022 Name: Cassandra Cunningham MRN: [id number] DOB: [birth year]  Referred by: Tollie Eth, NP Reason for referral : Appointment (I called the patient to reschedule her missed phone appointment with the MM BSW. I had to leave a message on her VM.)   A second unsuccessful telephone outreach was attempted today. The patient was referred to the case management team for assistance with care management and care coordination.   Follow Up Plan: A HIPAA compliant phone message was left for the patient providing contact information and requesting a return call.  The care management team will reach out to the patient again over the next 7 days.   Weston Settle Care Guide  [facility] Managed  Care Guide [facility]  [phone]

## 2022-11-28 ENCOUNTER — Encounter: Payer: Self-pay | Admitting: Nurse Practitioner

## 2022-11-29 ENCOUNTER — Telehealth: Payer: Self-pay

## 2022-11-29 NOTE — Telephone Encounter (Signed)
..   Medicaid Managed Care   Unsuccessful Outreach Note  11/29/2022 Name: Cassandra Cunningham MRN: [id number] DOB: 1970/04/08  Referred by: Tollie Eth, NP Reason for referral : Appointment   Third unsuccessful telephone outreach was attempted today. The patient was referred to the case management team for assistance with care management and care coordination. The patient's primary care provider has been notified of our unsuccessful attempts to make or maintain contact with the patient. The care management team is pleased to engage with this patient at any time in the future should he/she be interested in assistance from the care management team.   Follow Up Plan: We have been unable to make contact with the patient for follow up. The care management team is available to follow up with the patient after provider conversation with the patient regarding recommendation for care management engagement and subsequent re-referral to the care management team.   Weston Settle Care Guide  [facility] Managed  Care Guide [facility]  [phone]

## 2023-04-14 ENCOUNTER — Other Ambulatory Visit: Payer: Self-pay | Admitting: Nurse Practitioner

## 2023-04-14 DIAGNOSIS — N952 Postmenopausal atrophic vaginitis: Secondary | ICD-10-CM

## 2023-06-30 DIAGNOSIS — F209 Schizophrenia, unspecified: Secondary | ICD-10-CM | POA: Insufficient documentation

## 2023-09-04 ENCOUNTER — Other Ambulatory Visit: Payer: Self-pay | Admitting: Nurse Practitioner

## 2023-09-04 DIAGNOSIS — N952 Postmenopausal atrophic vaginitis: Secondary | ICD-10-CM

## 2023-10-02 ENCOUNTER — Other Ambulatory Visit: Payer: Self-pay | Admitting: Nurse Practitioner

## 2023-10-02 DIAGNOSIS — N952 Postmenopausal atrophic vaginitis: Secondary | ICD-10-CM
